# Patient Record
Sex: Male | Born: 1940
Health system: Southern US, Community
[De-identification: ages and names within clinical notes are randomized; demographics above are authoritative.]

## PROBLEM LIST (undated history)

## (undated) ENCOUNTER — Emergency Department (HOSPITAL_COMMUNITY): Admission: EM | Payer: PPO | Source: Home / Self Care

## (undated) DIAGNOSIS — K648 Other hemorrhoids: Secondary | ICD-10-CM

## (undated) DIAGNOSIS — I10 Essential (primary) hypertension: Secondary | ICD-10-CM

## (undated) DIAGNOSIS — E7800 Pure hypercholesterolemia, unspecified: Secondary | ICD-10-CM

## (undated) DIAGNOSIS — I4891 Unspecified atrial fibrillation: Secondary | ICD-10-CM

## (undated) DIAGNOSIS — F0390 Unspecified dementia without behavioral disturbance: Secondary | ICD-10-CM

## (undated) DIAGNOSIS — R339 Retention of urine, unspecified: Secondary | ICD-10-CM

## (undated) DIAGNOSIS — K5790 Diverticulosis of intestine, part unspecified, without perforation or abscess without bleeding: Secondary | ICD-10-CM

## (undated) DIAGNOSIS — K635 Polyp of colon: Secondary | ICD-10-CM

## (undated) HISTORY — DX: Diverticulosis of intestine, part unspecified, without perforation or abscess without bleeding: K57.90

## (undated) HISTORY — DX: Other hemorrhoids: K64.8

## (undated) HISTORY — DX: Polyp of colon: K63.5

## (undated) HISTORY — PX: SKIN CANCER EXCISION: SHX779

## (undated) HISTORY — DX: Essential (primary) hypertension: I10

## (undated) HISTORY — DX: Pure hypercholesterolemia, unspecified: E78.00

## (undated) HISTORY — PX: EYE SURGERY: SHX253

## (undated) HISTORY — PX: SQUAMOUS CELL CARCINOMA EXCISION: SHX2433

---
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
## 1979-11-14 HISTORY — PX: VASECTOMY: SHX75

## 1999-10-07 ENCOUNTER — Encounter: Admission: RE | Admit: 1999-10-07 | Discharge: 1999-10-07 | Payer: Self-pay | Admitting: *Deleted

## 2004-11-13 HISTORY — PX: HERNIA REPAIR: SHX51

## 2004-11-17 ENCOUNTER — Ambulatory Visit: Payer: Self-pay | Admitting: Internal Medicine

## 2005-02-02 ENCOUNTER — Encounter: Admission: RE | Admit: 2005-02-02 | Discharge: 2005-02-02 | Payer: Self-pay | Admitting: Surgery

## 2005-05-25 ENCOUNTER — Ambulatory Visit: Payer: Self-pay | Admitting: Internal Medicine

## 2005-05-29 ENCOUNTER — Ambulatory Visit: Payer: Self-pay

## 2005-07-06 ENCOUNTER — Ambulatory Visit: Payer: Self-pay | Admitting: Internal Medicine

## 2005-07-12 ENCOUNTER — Ambulatory Visit: Payer: Self-pay | Admitting: *Deleted

## 2005-09-04 ENCOUNTER — Ambulatory Visit: Payer: Self-pay | Admitting: Internal Medicine

## 2005-11-21 ENCOUNTER — Ambulatory Visit: Payer: Self-pay | Admitting: Internal Medicine

## 2006-08-21 ENCOUNTER — Ambulatory Visit: Payer: Self-pay | Admitting: Internal Medicine

## 2006-08-21 LAB — CONVERTED CEMR LAB
ALT: 22 units/L (ref 0–40)
AST: 22 units/L (ref 0–37)
BUN: 23 mg/dL (ref 6–23)
Chol/HDL Ratio, serum: 3
Cholesterol: 150 mg/dL (ref 0–200)
Creatinine, Ser: 1.2 mg/dL (ref 0.4–1.5)
HDL: 49.5 mg/dL (ref >39.0)
LDL Cholesterol: 97 mg/dL (ref 0–99)
PSA: 0.94 ng/mL (ref 0.10–4.00)
Potassium: 4.7 meq/L (ref 3.5–5.1)
Triglyceride fasting, serum: 19 mg/dL (ref 0–149)
VLDL: 4 mg/dL (ref 0–40)

## 2006-09-06 ENCOUNTER — Ambulatory Visit: Payer: Self-pay | Admitting: Internal Medicine

## 2006-09-26 ENCOUNTER — Ambulatory Visit: Payer: Self-pay | Admitting: Gastroenterology

## 2006-10-10 ENCOUNTER — Ambulatory Visit: Payer: Self-pay | Admitting: Gastroenterology

## 2007-10-03 ENCOUNTER — Ambulatory Visit: Payer: Self-pay | Admitting: Internal Medicine

## 2007-10-03 DIAGNOSIS — M2140 Flat foot [pes planus] (acquired), unspecified foot: Secondary | ICD-10-CM | POA: Insufficient documentation

## 2007-10-03 DIAGNOSIS — M159 Polyosteoarthritis, unspecified: Secondary | ICD-10-CM | POA: Insufficient documentation

## 2007-11-15 ENCOUNTER — Telehealth (INDEPENDENT_AMBULATORY_CARE_PROVIDER_SITE_OTHER): Payer: Self-pay | Admitting: *Deleted

## 2007-11-18 ENCOUNTER — Encounter: Payer: Self-pay | Admitting: Internal Medicine

## 2007-11-18 ENCOUNTER — Telehealth (INDEPENDENT_AMBULATORY_CARE_PROVIDER_SITE_OTHER): Payer: Self-pay | Admitting: *Deleted

## 2008-02-04 ENCOUNTER — Ambulatory Visit: Payer: Self-pay | Admitting: Internal Medicine

## 2008-02-09 LAB — CONVERTED CEMR LAB
ALT: 29 units/L (ref 0–53)
AST: 21 units/L (ref 0–37)
BUN: 20 mg/dL (ref 6–23)
Cholesterol: 125 mg/dL (ref 0–200)
Creatinine, Ser: 1.1 mg/dL (ref 0.4–1.5)
Glucose, Bld: 123 mg/dL — ABNORMAL HIGH (ref 70–99)
HDL: 42.4 mg/dL (ref >39.0)
LDL Cholesterol: 77 mg/dL (ref 0–99)
PSA: 0.95 ng/mL (ref 0.10–4.00)
Potassium: 4.6 meq/L (ref 3.5–5.1)
TSH: 0.78 microintl units/mL (ref 0.35–5.50)
Total CHOL/HDL Ratio: 2.9
Triglycerides: 28 mg/dL (ref 0–149)
VLDL: 6 mg/dL (ref 0–40)

## 2008-02-10 ENCOUNTER — Encounter (INDEPENDENT_AMBULATORY_CARE_PROVIDER_SITE_OTHER): Payer: Self-pay | Admitting: *Deleted

## 2008-02-11 ENCOUNTER — Ambulatory Visit: Payer: Self-pay | Admitting: Internal Medicine

## 2008-02-11 DIAGNOSIS — E785 Hyperlipidemia, unspecified: Secondary | ICD-10-CM | POA: Insufficient documentation

## 2008-02-11 DIAGNOSIS — N400 Enlarged prostate without lower urinary tract symptoms: Secondary | ICD-10-CM | POA: Insufficient documentation

## 2008-02-11 DIAGNOSIS — I10 Essential (primary) hypertension: Secondary | ICD-10-CM | POA: Insufficient documentation

## 2008-02-11 DIAGNOSIS — C439 Malignant melanoma of skin, unspecified: Secondary | ICD-10-CM | POA: Insufficient documentation

## 2008-02-11 DIAGNOSIS — Z85828 Personal history of other malignant neoplasm of skin: Secondary | ICD-10-CM | POA: Insufficient documentation

## 2008-02-13 ENCOUNTER — Encounter (INDEPENDENT_AMBULATORY_CARE_PROVIDER_SITE_OTHER): Payer: Self-pay | Admitting: *Deleted

## 2008-03-30 ENCOUNTER — Telehealth (INDEPENDENT_AMBULATORY_CARE_PROVIDER_SITE_OTHER): Payer: Self-pay | Admitting: *Deleted

## 2008-03-31 ENCOUNTER — Ambulatory Visit: Payer: Self-pay | Admitting: Internal Medicine

## 2008-03-31 DIAGNOSIS — R229 Localized swelling, mass and lump, unspecified: Secondary | ICD-10-CM | POA: Insufficient documentation

## 2008-04-13 ENCOUNTER — Other Ambulatory Visit: Admission: RE | Admit: 2008-04-13 | Discharge: 2008-04-13 | Payer: Self-pay | Admitting: Otolaryngology

## 2008-04-13 ENCOUNTER — Encounter: Payer: Self-pay | Admitting: Internal Medicine

## 2008-04-17 ENCOUNTER — Telehealth: Payer: Self-pay | Admitting: Internal Medicine

## 2008-04-22 ENCOUNTER — Encounter: Payer: Self-pay | Admitting: Internal Medicine

## 2008-04-23 ENCOUNTER — Telehealth (INDEPENDENT_AMBULATORY_CARE_PROVIDER_SITE_OTHER): Payer: Self-pay | Admitting: *Deleted

## 2008-04-24 ENCOUNTER — Encounter: Payer: Self-pay | Admitting: Internal Medicine

## 2008-04-24 ENCOUNTER — Encounter: Admission: RE | Admit: 2008-04-24 | Discharge: 2008-04-24 | Payer: Self-pay | Admitting: Otolaryngology

## 2008-04-27 ENCOUNTER — Encounter: Payer: Self-pay | Admitting: Internal Medicine

## 2008-04-27 ENCOUNTER — Ambulatory Visit: Admission: RE | Admit: 2008-04-27 | Discharge: 2008-05-28 | Payer: Self-pay | Admitting: Radiation Oncology

## 2008-04-27 ENCOUNTER — Ambulatory Visit (HOSPITAL_COMMUNITY): Admission: RE | Admit: 2008-04-27 | Discharge: 2008-04-27 | Payer: Self-pay | Admitting: Otolaryngology

## 2008-04-28 ENCOUNTER — Encounter: Payer: Self-pay | Admitting: Internal Medicine

## 2008-05-04 ENCOUNTER — Encounter: Payer: Self-pay | Admitting: Internal Medicine

## 2008-06-05 ENCOUNTER — Encounter: Payer: Self-pay | Admitting: Internal Medicine

## 2008-06-10 ENCOUNTER — Encounter: Payer: Self-pay | Admitting: Internal Medicine

## 2008-06-10 ENCOUNTER — Telehealth (INDEPENDENT_AMBULATORY_CARE_PROVIDER_SITE_OTHER): Payer: Self-pay | Admitting: *Deleted

## 2008-06-16 ENCOUNTER — Ambulatory Visit: Admission: RE | Admit: 2008-06-16 | Discharge: 2008-09-08 | Payer: Self-pay | Admitting: Radiation Oncology

## 2008-06-17 ENCOUNTER — Encounter: Payer: Self-pay | Admitting: Internal Medicine

## 2008-06-23 ENCOUNTER — Telehealth: Payer: Self-pay | Admitting: Internal Medicine

## 2008-06-24 ENCOUNTER — Ambulatory Visit: Payer: Self-pay | Admitting: Dentistry

## 2008-06-24 ENCOUNTER — Telehealth (INDEPENDENT_AMBULATORY_CARE_PROVIDER_SITE_OTHER): Payer: Self-pay | Admitting: *Deleted

## 2008-06-24 ENCOUNTER — Encounter: Admission: RE | Admit: 2008-06-24 | Discharge: 2008-06-24 | Payer: Self-pay | Admitting: Dentistry

## 2008-06-29 ENCOUNTER — Ambulatory Visit: Payer: Self-pay | Admitting: Dentistry

## 2008-07-01 ENCOUNTER — Encounter: Payer: Self-pay | Admitting: Internal Medicine

## 2008-08-25 ENCOUNTER — Encounter: Payer: Self-pay | Admitting: Internal Medicine

## 2008-08-25 ENCOUNTER — Telehealth: Payer: Self-pay | Admitting: Internal Medicine

## 2008-08-31 ENCOUNTER — Encounter: Payer: Self-pay | Admitting: Internal Medicine

## 2008-09-14 ENCOUNTER — Encounter: Payer: Self-pay | Admitting: Internal Medicine

## 2008-09-17 ENCOUNTER — Encounter: Payer: Self-pay | Admitting: Internal Medicine

## 2008-09-21 ENCOUNTER — Encounter: Payer: Self-pay | Admitting: Internal Medicine

## 2008-09-30 ENCOUNTER — Encounter: Payer: Self-pay | Admitting: Internal Medicine

## 2008-10-05 ENCOUNTER — Ambulatory Visit: Payer: Self-pay | Admitting: Dentistry

## 2008-10-19 ENCOUNTER — Encounter: Payer: Self-pay | Admitting: Internal Medicine

## 2008-10-21 ENCOUNTER — Encounter: Payer: Self-pay | Admitting: Internal Medicine

## 2008-11-02 ENCOUNTER — Encounter: Payer: Self-pay | Admitting: Internal Medicine

## 2008-11-10 ENCOUNTER — Telehealth (INDEPENDENT_AMBULATORY_CARE_PROVIDER_SITE_OTHER): Payer: Self-pay | Admitting: *Deleted

## 2008-11-10 ENCOUNTER — Ambulatory Visit: Payer: Self-pay | Admitting: Internal Medicine

## 2008-11-10 DIAGNOSIS — R7309 Other abnormal glucose: Secondary | ICD-10-CM | POA: Insufficient documentation

## 2008-11-10 LAB — CONVERTED CEMR LAB
BUN: 17 mg/dL (ref 6–23)
Creatinine, Ser: 0.9 mg/dL (ref 0.4–1.5)
Potassium: 5 meq/L (ref 3.5–5.1)

## 2008-11-11 ENCOUNTER — Encounter (INDEPENDENT_AMBULATORY_CARE_PROVIDER_SITE_OTHER): Payer: Self-pay | Admitting: *Deleted

## 2009-01-05 ENCOUNTER — Encounter: Payer: Self-pay | Admitting: Internal Medicine

## 2009-02-09 ENCOUNTER — Telehealth (INDEPENDENT_AMBULATORY_CARE_PROVIDER_SITE_OTHER): Payer: Self-pay | Admitting: *Deleted

## 2009-02-09 ENCOUNTER — Encounter: Payer: Self-pay | Admitting: Internal Medicine

## 2009-02-16 ENCOUNTER — Encounter: Payer: Self-pay | Admitting: Internal Medicine

## 2009-03-26 ENCOUNTER — Telehealth (INDEPENDENT_AMBULATORY_CARE_PROVIDER_SITE_OTHER): Payer: Self-pay | Admitting: *Deleted

## 2010-12-11 LAB — CONVERTED CEMR LAB
Hgb A1c MFr Bld: 5.2 % (ref 4.6–6.0)
Microalb Creat Ratio: 27 mg/g (ref 0.0–30.0)

## 2013-12-26 ENCOUNTER — Other Ambulatory Visit (HOSPITAL_COMMUNITY): Payer: Self-pay | Admitting: Internal Medicine

## 2013-12-26 ENCOUNTER — Ambulatory Visit (HOSPITAL_COMMUNITY)
Admission: RE | Admit: 2013-12-26 | Discharge: 2013-12-26 | Disposition: A | Discharge status: Home / Self Care | Payer: Medicare HMO | Source: Ambulatory Visit | Attending: Vascular Surgery | Admitting: Vascular Surgery

## 2013-12-26 DIAGNOSIS — I6529 Occlusion and stenosis of unspecified carotid artery: Secondary | ICD-10-CM

## 2013-12-26 DIAGNOSIS — Z923 Personal history of irradiation: Secondary | ICD-10-CM | POA: Insufficient documentation

## 2014-10-26 DIAGNOSIS — E875 Hyperkalemia: Secondary | ICD-10-CM | POA: Diagnosis present

## 2015-09-01 DIAGNOSIS — R69 Illness, unspecified: Secondary | ICD-10-CM | POA: Diagnosis not present

## 2015-09-01 DIAGNOSIS — C44222 Squamous cell carcinoma of skin of right ear and external auricular canal: Secondary | ICD-10-CM | POA: Diagnosis not present

## 2015-09-01 DIAGNOSIS — K0381 Cracked tooth: Secondary | ICD-10-CM | POA: Diagnosis not present

## 2015-09-22 DIAGNOSIS — R69 Illness, unspecified: Secondary | ICD-10-CM | POA: Diagnosis not present

## 2015-09-22 DIAGNOSIS — K0381 Cracked tooth: Secondary | ICD-10-CM | POA: Diagnosis not present

## 2015-09-22 DIAGNOSIS — C44222 Squamous cell carcinoma of skin of right ear and external auricular canal: Secondary | ICD-10-CM | POA: Diagnosis not present

## 2015-10-06 DIAGNOSIS — Z23 Encounter for immunization: Secondary | ICD-10-CM | POA: Diagnosis not present

## 2015-10-11 DIAGNOSIS — Z85820 Personal history of malignant melanoma of skin: Secondary | ICD-10-CM | POA: Diagnosis not present

## 2015-10-11 DIAGNOSIS — D485 Neoplasm of uncertain behavior of skin: Secondary | ICD-10-CM | POA: Diagnosis not present

## 2015-10-11 DIAGNOSIS — D0439 Carcinoma in situ of skin of other parts of face: Secondary | ICD-10-CM | POA: Diagnosis not present

## 2015-10-11 DIAGNOSIS — L570 Actinic keratosis: Secondary | ICD-10-CM | POA: Diagnosis not present

## 2015-10-11 DIAGNOSIS — Z85828 Personal history of other malignant neoplasm of skin: Secondary | ICD-10-CM | POA: Diagnosis not present

## 2015-10-13 DIAGNOSIS — C44222 Squamous cell carcinoma of skin of right ear and external auricular canal: Secondary | ICD-10-CM | POA: Diagnosis not present

## 2015-10-13 DIAGNOSIS — K0381 Cracked tooth: Secondary | ICD-10-CM | POA: Diagnosis not present

## 2015-11-26 DIAGNOSIS — Z0000 Encounter for general adult medical examination without abnormal findings: Secondary | ICD-10-CM | POA: Diagnosis not present

## 2015-11-26 DIAGNOSIS — I2510 Atherosclerotic heart disease of native coronary artery without angina pectoris: Secondary | ICD-10-CM | POA: Diagnosis not present

## 2015-11-26 DIAGNOSIS — E785 Hyperlipidemia, unspecified: Secondary | ICD-10-CM | POA: Diagnosis not present

## 2015-11-26 DIAGNOSIS — I10 Essential (primary) hypertension: Secondary | ICD-10-CM | POA: Diagnosis not present

## 2015-11-26 DIAGNOSIS — R7301 Impaired fasting glucose: Secondary | ICD-10-CM | POA: Diagnosis not present

## 2015-11-26 DIAGNOSIS — Z125 Encounter for screening for malignant neoplasm of prostate: Secondary | ICD-10-CM | POA: Diagnosis not present

## 2015-11-26 DIAGNOSIS — E784 Other hyperlipidemia: Secondary | ICD-10-CM | POA: Diagnosis not present

## 2015-11-26 DIAGNOSIS — N183 Chronic kidney disease, stage 3 (moderate): Secondary | ICD-10-CM | POA: Diagnosis not present

## 2015-12-02 DIAGNOSIS — R413 Other amnesia: Secondary | ICD-10-CM | POA: Diagnosis not present

## 2015-12-02 DIAGNOSIS — I739 Peripheral vascular disease, unspecified: Secondary | ICD-10-CM | POA: Diagnosis not present

## 2015-12-02 DIAGNOSIS — I2510 Atherosclerotic heart disease of native coronary artery without angina pectoris: Secondary | ICD-10-CM | POA: Diagnosis not present

## 2015-12-02 DIAGNOSIS — R808 Other proteinuria: Secondary | ICD-10-CM | POA: Diagnosis not present

## 2015-12-02 DIAGNOSIS — Z1212 Encounter for screening for malignant neoplasm of rectum: Secondary | ICD-10-CM | POA: Diagnosis not present

## 2015-12-02 DIAGNOSIS — E784 Other hyperlipidemia: Secondary | ICD-10-CM | POA: Diagnosis not present

## 2015-12-02 DIAGNOSIS — H9190 Unspecified hearing loss, unspecified ear: Secondary | ICD-10-CM | POA: Diagnosis not present

## 2015-12-02 DIAGNOSIS — N529 Male erectile dysfunction, unspecified: Secondary | ICD-10-CM | POA: Diagnosis not present

## 2015-12-02 DIAGNOSIS — E875 Hyperkalemia: Secondary | ICD-10-CM | POA: Diagnosis not present

## 2015-12-02 DIAGNOSIS — N183 Chronic kidney disease, stage 3 (moderate): Secondary | ICD-10-CM | POA: Diagnosis not present

## 2015-12-02 DIAGNOSIS — Z0000 Encounter for general adult medical examination without abnormal findings: Secondary | ICD-10-CM | POA: Diagnosis not present

## 2015-12-13 DIAGNOSIS — R69 Illness, unspecified: Secondary | ICD-10-CM | POA: Diagnosis not present

## 2016-01-10 DIAGNOSIS — D0439 Carcinoma in situ of skin of other parts of face: Secondary | ICD-10-CM | POA: Diagnosis not present

## 2016-01-10 DIAGNOSIS — Z85820 Personal history of malignant melanoma of skin: Secondary | ICD-10-CM | POA: Diagnosis not present

## 2016-01-10 DIAGNOSIS — L570 Actinic keratosis: Secondary | ICD-10-CM | POA: Diagnosis not present

## 2016-01-10 DIAGNOSIS — Z87891 Personal history of nicotine dependence: Secondary | ICD-10-CM | POA: Diagnosis not present

## 2016-01-10 DIAGNOSIS — L821 Other seborrheic keratosis: Secondary | ICD-10-CM | POA: Diagnosis not present

## 2016-01-10 DIAGNOSIS — B351 Tinea unguium: Secondary | ICD-10-CM | POA: Diagnosis not present

## 2016-01-10 DIAGNOSIS — L219 Seborrheic dermatitis, unspecified: Secondary | ICD-10-CM | POA: Diagnosis not present

## 2016-01-10 DIAGNOSIS — L814 Other melanin hyperpigmentation: Secondary | ICD-10-CM | POA: Diagnosis not present

## 2016-01-10 DIAGNOSIS — Z85828 Personal history of other malignant neoplasm of skin: Secondary | ICD-10-CM | POA: Diagnosis not present

## 2016-01-12 DIAGNOSIS — Z23 Encounter for immunization: Secondary | ICD-10-CM | POA: Diagnosis not present

## 2016-02-21 DIAGNOSIS — Z85828 Personal history of other malignant neoplasm of skin: Secondary | ICD-10-CM | POA: Diagnosis not present

## 2016-02-21 DIAGNOSIS — L989 Disorder of the skin and subcutaneous tissue, unspecified: Secondary | ICD-10-CM | POA: Diagnosis not present

## 2016-02-21 DIAGNOSIS — Z9889 Other specified postprocedural states: Secondary | ICD-10-CM | POA: Diagnosis not present

## 2016-02-21 DIAGNOSIS — R69 Illness, unspecified: Secondary | ICD-10-CM | POA: Diagnosis not present

## 2016-02-21 DIAGNOSIS — L910 Hypertrophic scar: Secondary | ICD-10-CM | POA: Diagnosis not present

## 2016-02-21 DIAGNOSIS — Z923 Personal history of irradiation: Secondary | ICD-10-CM | POA: Diagnosis not present

## 2016-02-21 DIAGNOSIS — H905 Unspecified sensorineural hearing loss: Secondary | ICD-10-CM | POA: Diagnosis not present

## 2016-02-21 DIAGNOSIS — Z483 Aftercare following surgery for neoplasm: Secondary | ICD-10-CM | POA: Diagnosis not present

## 2016-02-21 DIAGNOSIS — C44329 Squamous cell carcinoma of skin of other parts of face: Secondary | ICD-10-CM | POA: Diagnosis not present

## 2016-07-04 DIAGNOSIS — Z85820 Personal history of malignant melanoma of skin: Secondary | ICD-10-CM | POA: Diagnosis not present

## 2016-07-04 DIAGNOSIS — L814 Other melanin hyperpigmentation: Secondary | ICD-10-CM | POA: Diagnosis not present

## 2016-07-04 DIAGNOSIS — L710 Perioral dermatitis: Secondary | ICD-10-CM | POA: Diagnosis not present

## 2016-07-04 DIAGNOSIS — B351 Tinea unguium: Secondary | ICD-10-CM | POA: Diagnosis not present

## 2016-07-04 DIAGNOSIS — L219 Seborrheic dermatitis, unspecified: Secondary | ICD-10-CM | POA: Diagnosis not present

## 2016-07-04 DIAGNOSIS — D485 Neoplasm of uncertain behavior of skin: Secondary | ICD-10-CM | POA: Diagnosis not present

## 2016-07-04 DIAGNOSIS — L851 Acquired keratosis [keratoderma] palmaris et plantaris: Secondary | ICD-10-CM | POA: Diagnosis not present

## 2016-07-04 DIAGNOSIS — L821 Other seborrheic keratosis: Secondary | ICD-10-CM | POA: Diagnosis not present

## 2016-07-04 DIAGNOSIS — L731 Pseudofolliculitis barbae: Secondary | ICD-10-CM | POA: Diagnosis not present

## 2016-07-04 DIAGNOSIS — L570 Actinic keratosis: Secondary | ICD-10-CM | POA: Diagnosis not present

## 2016-07-04 DIAGNOSIS — Z85828 Personal history of other malignant neoplasm of skin: Secondary | ICD-10-CM | POA: Diagnosis not present

## 2016-07-24 ENCOUNTER — Encounter: Payer: Self-pay | Admitting: Gastroenterology

## 2016-08-07 ENCOUNTER — Telehealth: Payer: Self-pay | Admitting: Internal Medicine

## 2016-08-07 DIAGNOSIS — I10 Essential (primary) hypertension: Secondary | ICD-10-CM | POA: Diagnosis not present

## 2016-08-07 DIAGNOSIS — J069 Acute upper respiratory infection, unspecified: Secondary | ICD-10-CM | POA: Diagnosis not present

## 2016-08-07 DIAGNOSIS — R05 Cough: Secondary | ICD-10-CM | POA: Diagnosis not present

## 2016-08-07 DIAGNOSIS — Z6825 Body mass index (BMI) 25.0-25.9, adult: Secondary | ICD-10-CM | POA: Diagnosis not present

## 2016-08-07 NOTE — Telephone Encounter (Signed)
Yes

## 2016-08-11 ENCOUNTER — Encounter: Payer: Self-pay | Admitting: Internal Medicine

## 2016-08-21 DIAGNOSIS — H9041 Sensorineural hearing loss, unilateral, right ear, with unrestricted hearing on the contralateral side: Secondary | ICD-10-CM | POA: Diagnosis not present

## 2016-08-21 DIAGNOSIS — C44222 Squamous cell carcinoma of skin of right ear and external auricular canal: Secondary | ICD-10-CM | POA: Diagnosis not present

## 2016-08-21 DIAGNOSIS — C760 Malignant neoplasm of head, face and neck: Secondary | ICD-10-CM | POA: Diagnosis not present

## 2016-08-21 DIAGNOSIS — Z974 Presence of external hearing-aid: Secondary | ICD-10-CM | POA: Diagnosis not present

## 2016-09-11 DIAGNOSIS — H04551 Acquired stenosis of right nasolacrimal duct: Secondary | ICD-10-CM | POA: Diagnosis not present

## 2016-09-11 DIAGNOSIS — H0236 Blepharochalasis left eye, unspecified eyelid: Secondary | ICD-10-CM | POA: Diagnosis not present

## 2016-09-11 DIAGNOSIS — H0259 Other disorders affecting eyelid function: Secondary | ICD-10-CM | POA: Diagnosis not present

## 2016-09-11 DIAGNOSIS — S0451XS Injury of facial nerve, right side, sequela: Secondary | ICD-10-CM | POA: Diagnosis not present

## 2016-09-11 DIAGNOSIS — I10 Essential (primary) hypertension: Secondary | ICD-10-CM | POA: Diagnosis not present

## 2016-09-11 DIAGNOSIS — H02103 Unspecified ectropion of right eye, unspecified eyelid: Secondary | ICD-10-CM | POA: Diagnosis not present

## 2016-09-11 DIAGNOSIS — X58XXXS Exposure to other specified factors, sequela: Secondary | ICD-10-CM | POA: Diagnosis not present

## 2016-09-11 DIAGNOSIS — H0233 Blepharochalasis right eye, unspecified eyelid: Secondary | ICD-10-CM | POA: Diagnosis not present

## 2016-09-11 DIAGNOSIS — Z9841 Cataract extraction status, right eye: Secondary | ICD-10-CM | POA: Diagnosis not present

## 2016-09-11 DIAGNOSIS — Z9842 Cataract extraction status, left eye: Secondary | ICD-10-CM | POA: Diagnosis not present

## 2016-09-11 DIAGNOSIS — H04221 Epiphora due to insufficient drainage, right lacrimal gland: Secondary | ICD-10-CM | POA: Diagnosis not present

## 2016-09-21 DIAGNOSIS — R05 Cough: Secondary | ICD-10-CM | POA: Diagnosis not present

## 2016-09-21 DIAGNOSIS — Z6825 Body mass index (BMI) 25.0-25.9, adult: Secondary | ICD-10-CM | POA: Diagnosis not present

## 2016-09-21 DIAGNOSIS — J069 Acute upper respiratory infection, unspecified: Secondary | ICD-10-CM | POA: Diagnosis not present

## 2016-09-27 ENCOUNTER — Encounter: Payer: Self-pay | Admitting: *Deleted

## 2016-09-27 DIAGNOSIS — J3489 Other specified disorders of nose and nasal sinuses: Secondary | ICD-10-CM | POA: Diagnosis not present

## 2016-09-27 DIAGNOSIS — J069 Acute upper respiratory infection, unspecified: Secondary | ICD-10-CM | POA: Diagnosis not present

## 2016-09-27 DIAGNOSIS — Z79899 Other long term (current) drug therapy: Secondary | ICD-10-CM | POA: Diagnosis not present

## 2016-09-27 DIAGNOSIS — I10 Essential (primary) hypertension: Secondary | ICD-10-CM | POA: Diagnosis not present

## 2016-09-27 DIAGNOSIS — J342 Deviated nasal septum: Secondary | ICD-10-CM | POA: Diagnosis not present

## 2016-09-27 DIAGNOSIS — Z87891 Personal history of nicotine dependence: Secondary | ICD-10-CM | POA: Diagnosis not present

## 2016-09-27 DIAGNOSIS — Z7982 Long term (current) use of aspirin: Secondary | ICD-10-CM | POA: Diagnosis not present

## 2016-09-27 DIAGNOSIS — H04201 Unspecified epiphora, right lacrimal gland: Secondary | ICD-10-CM | POA: Diagnosis not present

## 2016-09-27 DIAGNOSIS — J320 Chronic maxillary sinusitis: Secondary | ICD-10-CM | POA: Diagnosis not present

## 2016-10-07 DIAGNOSIS — R69 Illness, unspecified: Secondary | ICD-10-CM | POA: Diagnosis not present

## 2016-10-18 DIAGNOSIS — Z6825 Body mass index (BMI) 25.0-25.9, adult: Secondary | ICD-10-CM | POA: Diagnosis not present

## 2016-10-18 DIAGNOSIS — J40 Bronchitis, not specified as acute or chronic: Secondary | ICD-10-CM | POA: Diagnosis not present

## 2016-11-03 ENCOUNTER — Encounter: Payer: Self-pay | Admitting: Internal Medicine

## 2016-11-03 ENCOUNTER — Ambulatory Visit (INDEPENDENT_AMBULATORY_CARE_PROVIDER_SITE_OTHER): Payer: Medicare HMO | Admitting: Internal Medicine

## 2016-11-03 ENCOUNTER — Encounter (INDEPENDENT_AMBULATORY_CARE_PROVIDER_SITE_OTHER): Payer: Self-pay

## 2016-11-03 VITALS — BP 106/70 | HR 60 | Ht 71.26 in | Wt 181.1 lb

## 2016-11-03 DIAGNOSIS — Z1211 Encounter for screening for malignant neoplasm of colon: Secondary | ICD-10-CM

## 2016-11-03 MED ORDER — NA SULFATE-K SULFATE-MG SULF 17.5-3.13-1.6 GM/177ML PO SOLN: ORAL | 0 refills | Status: DC

## 2016-11-03 NOTE — Patient Instructions (Signed)
You have been scheduled for a colonoscopy. Please follow written instructions given to you at your visit today.  Please pick up your prep supplies at the pharmacy within the next 1-3 days. If you use inhalers (even only as needed), please bring them with you on the day of your procedure. Your physician has requested that you go to www.startemmi.com and enter the access code given to you at your visit today. This web site gives a general overview about your procedure. However, you should still follow specific instructions given to you by our office regarding your preparation for the procedure.  If you are age 74 or older, your body mass index should be between 23-30. Your Body mass index is 25.08 kg/m. If this is out of the aforementioned range listed, please consider follow up with your Primary Care Provider.  If you are age 24 or younger, your body mass index should be between 19-25. Your Body mass index is 25.08 kg/m. If this is out of the aformentioned range listed, please consider follow up with your Primary Care Provider.

## 2016-11-03 NOTE — Progress Notes (Signed)
Patient ID: Jeffrey [name], male   DOB: 01/17/1941, 75 y.o.   MRN: [id number] HPI: Jeffrey [name] is a 75 year old male with a history of diverticulosis, hyperplastic colon polyp, hypertension and hypercholesterolemia who is seen to discuss colorectal cancer screening. He is here today with his wife. He reports that he is feeling well and he denies current GI complaint. Reports good appetite. No nausea, vomiting, heartburn or trouble swallowing. No abdominal pain. No change in bowel habit. No diarrhea or constipation. No blood in his stool or melena.  His last colonoscopy was performed on 10/10/2006 by Dr. Verl Blalock. This revealed colonic diverticulosis and internal hemorrhoids. In the indication it is noted that he had hyperplastic colon polyps in 2002 that this report is not readily available for my review  He denies a family history of colon cancer  Past Medical History:  Diagnosis Date  . Colon polyps   . Diverticulosis   . Hypercholesterolemia   . Hypertension   . Internal hemorrhoids     Past Surgical History:  Procedure Laterality Date  . EYE SURGERY     X 4  . HERNIA REPAIR  2006   abdominal  . SQUAMOUS CELL CARCINOMA EXCISION     preauricular on the right  . TONSILLECTOMY  1947  . VASECTOMY  1981    No outpatient prescriptions prior to visit.   No facility-administered medications prior to visit.     Allergies  Allergen Reactions  . Other Other (See Comments)    Vicryl sutures, pte. Doesn't know what reaction he had, "Drs. Owens Shark + Yeatts deetrmined not to use it on me"    Family History  Problem Relation Age of Onset  . Heart disease Father   . Colon cancer Neg Hx   . Stomach cancer Neg Hx   . Esophageal cancer Neg Hx   . Rectal cancer Neg Hx   . Liver cancer Neg Hx     Social History  Substance Use Topics  . Smoking status: Former Research scientist (life sciences)  . Smokeless tobacco: Never Used  . Alcohol use Yes     Comment: limited    ROS: As per history of present  illness, otherwise negative  BP 106/70   Pulse 60   Ht 5' 11.26" (1.81 m)   Wt 181 lb 2 oz (82.2 kg)   BMI 25.08 kg/m  Constitutional: Well-developed and well-nourished. No distress. HEENT:  Oropharynx is clear and moist. No oropharyngeal exudate. Conjunctivae are normal.  No scleral icterus. Neck: Neck supple. Trachea midline. Cardiovascular: Normal rate, regular rhythm and intact distal pulses.  Pulmonary/chest: Effort normal and breath sounds normal. No wheezing, rales or rhonchi. Abdominal: Soft, nontender, nondistended. Bowel sounds active throughout.  Extremities: no clubbing, cyanosis, or edema Neurological: Alert and oriented to person place and time. Skin: Skin is warm and dry.  Psychiatric: Normal mood and affect. Behavior is normal.   ASSESSMENT/PLAN: 75 year old male with a history of diverticulosis, hyperplastic colon polyp, hypertension and hypercholesterolemia who is seen to discuss colorectal cancer screening.  1. Colon cancer screening -- average risk, due for screening colonoscopy at this time. We discussed the risk of benefit and alternatives and he wishes to proceed. We briefly discussed Cologuard but after this discussion he and his wife prefer colonoscopy. The test will be scheduled for him.   [id number], [place], [place] [address]

## 2016-11-16 ENCOUNTER — Telehealth: Payer: Self-pay | Admitting: Internal Medicine

## 2016-11-16 NOTE — Telephone Encounter (Signed)
Advised patient that I can make a 50 dollar voucher available to him. He requests it be mailed to his home. Voucher mailed.

## 2016-11-28 DIAGNOSIS — I779 Disorder of arteries and arterioles, unspecified: Secondary | ICD-10-CM | POA: Insufficient documentation

## 2016-12-06 ENCOUNTER — Ambulatory Visit (AMBULATORY_SURGERY_CENTER): Payer: Medicare HMO | Admitting: Internal Medicine

## 2016-12-06 ENCOUNTER — Encounter: Payer: Self-pay | Admitting: Internal Medicine

## 2016-12-06 VITALS — BP 140/90 | HR 69 | Temp 98.0°F | Resp 13 | Ht 71.0 in | Wt 181.0 lb

## 2016-12-06 DIAGNOSIS — Z1211 Encounter for screening for malignant neoplasm of colon: Secondary | ICD-10-CM | POA: Diagnosis present

## 2016-12-06 DIAGNOSIS — I10 Essential (primary) hypertension: Secondary | ICD-10-CM | POA: Diagnosis not present

## 2016-12-06 DIAGNOSIS — Z1212 Encounter for screening for malignant neoplasm of rectum: Secondary | ICD-10-CM | POA: Diagnosis not present

## 2016-12-06 MED ORDER — SODIUM CHLORIDE 0.9 % IV SOLN: 500.0000 mL | INTRAVENOUS | Status: DC

## 2016-12-06 NOTE — Progress Notes (Signed)
Report to PACU, RN, vss, BBS= Clear.  

## 2016-12-06 NOTE — Patient Instructions (Signed)
Handouts given: Diverticulosis,Hemorrhoids.   YOU HAD AN ENDOSCOPIC PROCEDURE TODAY AT THE [facility]:   Refer to the procedure report that was given to you for any specific questions about what was found during the examination.  If the procedure report does not answer your questions, please call your gastroenterologist to clarify.  If you requested that your care partner not be given the details of your procedure findings, then the procedure report has been included in a sealed envelope for you to review at your convenience later.  YOU SHOULD EXPECT: Some feelings of bloating in the abdomen. Passage of more gas than usual.  Walking can help get rid of the air that was put into your GI tract during the procedure and reduce the bloating. If you had a lower endoscopy (such as a colonoscopy or flexible sigmoidoscopy) you may notice spotting of blood in your stool or on the toilet paper. If you underwent a bowel prep for your procedure, you may not have a normal bowel movement for a few days.  Please Note:  You might notice some irritation and congestion in your nose or some drainage.  This is from the oxygen used during your procedure.  There is no need for concern and it should clear up in a day or so.  SYMPTOMS TO REPORT IMMEDIATELY:   Following lower endoscopy (colonoscopy or flexible sigmoidoscopy):  Excessive amounts of blood in the stool  Significant tenderness or worsening of abdominal pains  Swelling of the abdomen that is new, acute  Fever of 100F or higher   For urgent or emergent issues, a gastroenterologist can be reached at any hour by calling [phone].   DIET:  We do recommend a small meal at first, but then you may proceed to your regular diet.  Drink plenty of fluids but you should avoid alcoholic beverages for 24 hours.  ACTIVITY:  You should plan to take it easy for the rest of today and you should NOT DRIVE or use heavy machinery until tomorrow (because of  the sedation medicines used during the test).    FOLLOW UP: Our staff will call the number listed on your records the next business day following your procedure to check on you and address any questions or concerns that you may have regarding the information given to you following your procedure. If we do not reach you, we will leave a message.  However, if you are feeling well and you are not experiencing any problems, there is no need to return our call.  We will assume that you have returned to your regular daily activities without incident.  If any biopsies were taken you will be contacted by phone or by letter within the next 1-3 weeks.  Please call us at [phone] if you have not heard about the biopsies in 3 weeks.    SIGNATURES/CONFIDENTIALITY: You and/or your care partner have signed paperwork which will be entered into your electronic medical record.  These signatures attest to the fact that that the information above on your After Visit Summary has been reviewed and is understood.  Full responsibility of the confidentiality of this discharge information lies with you and/or your care-partner. 

## 2016-12-06 NOTE — Op Note (Signed)
[facility] Patient Name: Jeffrey [name] Procedure Date: 12/06/2016 9:34 AM MRN: [id number] Endoscopist: Jerene Bears , MD Age: 76 Referring MD:  Date of Birth: 07-08-1941 Gender: Male Account #: [id number] Procedure:                Colonoscopy Indications:              Screening for colorectal malignant neoplasm, Last                            colonoscopy 10 years ago Medicines:                Monitored Anesthesia Care Procedure:                Pre-Anesthesia Assessment:                           - Prior to the procedure, a History and Physical                            was performed, and patient medications and                            allergies were reviewed. The patient's tolerance of                            previous anesthesia was also reviewed. The risks                            and benefits of the procedure and the sedation                            options and risks were discussed with the patient.                            All questions were answered, and informed consent                            was obtained. Prior Anticoagulants: The patient has                            taken no previous anticoagulant or antiplatelet                            agents. ASA Grade Assessment: III - A patient with                            severe systemic disease. After reviewing the risks                            and benefits, the patient was deemed in                            satisfactory condition to undergo the procedure.  After obtaining informed consent, the colonoscope                            was passed under direct vision. Throughout the                            procedure, the patient's blood pressure, pulse, and                            oxygen saturations were monitored continuously. The                            Model CF-HQ190L [id number]) scope was introduced                            through the anus and advanced to  the the cecum,                            identified by appendiceal orifice and ileocecal                            valve. The colonoscopy was performed without                            difficulty. The patient tolerated the procedure                            well. The quality of the bowel preparation was                            good. The ileocecal valve, appendiceal orifice, and                            rectum were photographed. The bowel preparation                            used was SUPREP. Scope In: 9:43:52 AM Scope Out: 9:59:26 AM Scope Withdrawal Time: 0 hours 10 minutes 45 seconds  Total Procedure Duration: 0 hours 15 minutes 34 seconds  Findings:                 The perianal examination was normal.                           Multiple small and large-mouthed diverticula were                            found from ascending colon to sigmoid colon.                           Internal hemorrhoids were found during                            retroflexion. The hemorrhoids were small.  The exam was otherwise without abnormality. Complications:            No immediate complications. Estimated Blood Loss:     Estimated blood loss was minimal. Impression:               - Moderate diverticulosis from ascending colon to                            sigmoid colon.                           - Internal hemorrhoids.                           - The examination was otherwise normal.                           - No specimens collected. Recommendation:           - Patient has a contact number available for                            emergencies. The signs and symptoms of potential                            delayed complications were discussed with the                            patient. Return to normal activities tomorrow.                            Written discharge instructions were provided to the                            patient.                           -  Resume previous diet.                           - Continue present medications.                           - No recommendation for repeat colonoscopy due to                            age. Screening colonoscopy generally stops around                            age 20 and your recommended interval is 10 years                            given findings from today's examination. Jerene Bears, MD 12/06/2016 10:04:01 AM This report has been signed electronically.

## 2016-12-07 ENCOUNTER — Telehealth: Payer: Self-pay

## 2016-12-07 NOTE — Telephone Encounter (Signed)
  Follow up Call-  Call back number 12/06/2016  Post procedure Call Back phone  # [phone]  Permission to leave phone message Yes  Some recent data might be hidden     Patient questions:  Do you have a fever, pain , or abdominal swelling? No. Pain Score  0 *  Have you tolerated food without any problems? Yes.    Have you been able to return to your normal activities? Yes.    Do you have any questions about your discharge instructions: Diet   No. Medications  No. Follow up visit  No.  Do you have questions or concerns about your Care? No.  Actions: * If pain score is 4 or above: No action needed, pain <4.

## 2016-12-11 DIAGNOSIS — E784 Other hyperlipidemia: Secondary | ICD-10-CM | POA: Diagnosis not present

## 2016-12-11 DIAGNOSIS — Z125 Encounter for screening for malignant neoplasm of prostate: Secondary | ICD-10-CM | POA: Diagnosis not present

## 2016-12-11 DIAGNOSIS — R7301 Impaired fasting glucose: Secondary | ICD-10-CM | POA: Diagnosis not present

## 2016-12-11 DIAGNOSIS — I10 Essential (primary) hypertension: Secondary | ICD-10-CM | POA: Diagnosis not present

## 2016-12-20 DIAGNOSIS — H9193 Unspecified hearing loss, bilateral: Secondary | ICD-10-CM | POA: Diagnosis not present

## 2016-12-20 DIAGNOSIS — Z125 Encounter for screening for malignant neoplasm of prostate: Secondary | ICD-10-CM | POA: Diagnosis not present

## 2016-12-20 DIAGNOSIS — N183 Chronic kidney disease, stage 3 (moderate): Secondary | ICD-10-CM | POA: Diagnosis not present

## 2016-12-20 DIAGNOSIS — Z0000 Encounter for general adult medical examination without abnormal findings: Secondary | ICD-10-CM | POA: Diagnosis not present

## 2016-12-20 DIAGNOSIS — I10 Essential (primary) hypertension: Secondary | ICD-10-CM | POA: Diagnosis not present

## 2016-12-20 DIAGNOSIS — R413 Other amnesia: Secondary | ICD-10-CM | POA: Diagnosis not present

## 2016-12-20 DIAGNOSIS — C4490 Unspecified malignant neoplasm of skin, unspecified: Secondary | ICD-10-CM | POA: Diagnosis not present

## 2016-12-20 DIAGNOSIS — I2510 Atherosclerotic heart disease of native coronary artery without angina pectoris: Secondary | ICD-10-CM | POA: Diagnosis not present

## 2016-12-20 DIAGNOSIS — E784 Other hyperlipidemia: Secondary | ICD-10-CM | POA: Diagnosis not present

## 2016-12-22 DIAGNOSIS — M159 Polyosteoarthritis, unspecified: Secondary | ICD-10-CM | POA: Diagnosis not present

## 2016-12-22 DIAGNOSIS — H02102 Unspecified ectropion of right lower eyelid: Secondary | ICD-10-CM | POA: Diagnosis not present

## 2016-12-22 DIAGNOSIS — E785 Hyperlipidemia, unspecified: Secondary | ICD-10-CM | POA: Diagnosis not present

## 2016-12-22 DIAGNOSIS — H04221 Epiphora due to insufficient drainage, right lacrimal gland: Secondary | ICD-10-CM | POA: Diagnosis not present

## 2016-12-22 DIAGNOSIS — H02002 Unspecified entropion of right lower eyelid: Secondary | ICD-10-CM | POA: Diagnosis not present

## 2016-12-22 DIAGNOSIS — I10 Essential (primary) hypertension: Secondary | ICD-10-CM | POA: Diagnosis not present

## 2016-12-22 DIAGNOSIS — H04551 Acquired stenosis of right nasolacrimal duct: Secondary | ICD-10-CM | POA: Diagnosis not present

## 2016-12-22 DIAGNOSIS — Z85828 Personal history of other malignant neoplasm of skin: Secondary | ICD-10-CM | POA: Diagnosis not present

## 2016-12-22 DIAGNOSIS — N400 Enlarged prostate without lower urinary tract symptoms: Secondary | ICD-10-CM | POA: Diagnosis not present

## 2016-12-22 DIAGNOSIS — J342 Deviated nasal septum: Secondary | ICD-10-CM | POA: Diagnosis not present

## 2016-12-22 DIAGNOSIS — H0259 Other disorders affecting eyelid function: Secondary | ICD-10-CM | POA: Diagnosis not present

## 2016-12-28 DIAGNOSIS — Z48810 Encounter for surgical aftercare following surgery on the sense organs: Secondary | ICD-10-CM | POA: Diagnosis not present

## 2016-12-28 DIAGNOSIS — Z9841 Cataract extraction status, right eye: Secondary | ICD-10-CM | POA: Diagnosis not present

## 2016-12-28 DIAGNOSIS — I10 Essential (primary) hypertension: Secondary | ICD-10-CM | POA: Diagnosis not present

## 2016-12-28 DIAGNOSIS — Z79899 Other long term (current) drug therapy: Secondary | ICD-10-CM | POA: Diagnosis not present

## 2016-12-28 DIAGNOSIS — Z7982 Long term (current) use of aspirin: Secondary | ICD-10-CM | POA: Diagnosis not present

## 2016-12-28 DIAGNOSIS — Z9842 Cataract extraction status, left eye: Secondary | ICD-10-CM | POA: Diagnosis not present

## 2016-12-28 DIAGNOSIS — Z87891 Personal history of nicotine dependence: Secondary | ICD-10-CM | POA: Diagnosis not present

## 2016-12-28 DIAGNOSIS — Z961 Presence of intraocular lens: Secondary | ICD-10-CM | POA: Diagnosis not present

## 2017-01-17 DIAGNOSIS — H524 Presbyopia: Secondary | ICD-10-CM | POA: Diagnosis not present

## 2017-01-18 DIAGNOSIS — L218 Other seborrheic dermatitis: Secondary | ICD-10-CM | POA: Diagnosis not present

## 2017-01-18 DIAGNOSIS — H02831 Dermatochalasis of right upper eyelid: Secondary | ICD-10-CM | POA: Diagnosis not present

## 2017-01-18 DIAGNOSIS — L570 Actinic keratosis: Secondary | ICD-10-CM | POA: Diagnosis not present

## 2017-01-18 DIAGNOSIS — Z85828 Personal history of other malignant neoplasm of skin: Secondary | ICD-10-CM | POA: Diagnosis not present

## 2017-01-18 DIAGNOSIS — Z48810 Encounter for surgical aftercare following surgery on the sense organs: Secondary | ICD-10-CM | POA: Diagnosis not present

## 2017-01-18 DIAGNOSIS — L821 Other seborrheic keratosis: Secondary | ICD-10-CM | POA: Diagnosis not present

## 2017-01-18 DIAGNOSIS — Z961 Presence of intraocular lens: Secondary | ICD-10-CM | POA: Diagnosis not present

## 2017-01-18 DIAGNOSIS — L219 Seborrheic dermatitis, unspecified: Secondary | ICD-10-CM | POA: Diagnosis not present

## 2017-01-18 DIAGNOSIS — Z9841 Cataract extraction status, right eye: Secondary | ICD-10-CM | POA: Diagnosis not present

## 2017-01-18 DIAGNOSIS — Z85820 Personal history of malignant melanoma of skin: Secondary | ICD-10-CM | POA: Diagnosis not present

## 2017-01-18 DIAGNOSIS — I10 Essential (primary) hypertension: Secondary | ICD-10-CM | POA: Diagnosis not present

## 2017-01-18 DIAGNOSIS — Z9842 Cataract extraction status, left eye: Secondary | ICD-10-CM | POA: Diagnosis not present

## 2017-01-18 DIAGNOSIS — Z87891 Personal history of nicotine dependence: Secondary | ICD-10-CM | POA: Diagnosis not present

## 2017-02-19 DIAGNOSIS — R69 Illness, unspecified: Secondary | ICD-10-CM | POA: Diagnosis not present

## 2017-02-19 DIAGNOSIS — C44222 Squamous cell carcinoma of skin of right ear and external auricular canal: Secondary | ICD-10-CM | POA: Diagnosis not present

## 2017-02-19 DIAGNOSIS — Z08 Encounter for follow-up examination after completed treatment for malignant neoplasm: Secondary | ICD-10-CM | POA: Diagnosis not present

## 2017-02-19 DIAGNOSIS — H902 Conductive hearing loss, unspecified: Secondary | ICD-10-CM | POA: Diagnosis not present

## 2017-02-19 DIAGNOSIS — H9190 Unspecified hearing loss, unspecified ear: Secondary | ICD-10-CM | POA: Diagnosis not present

## 2017-02-19 DIAGNOSIS — Z85828 Personal history of other malignant neoplasm of skin: Secondary | ICD-10-CM | POA: Diagnosis not present

## 2017-04-17 DIAGNOSIS — R7301 Impaired fasting glucose: Secondary | ICD-10-CM | POA: Diagnosis not present

## 2017-04-27 DIAGNOSIS — R413 Other amnesia: Secondary | ICD-10-CM | POA: Diagnosis not present

## 2017-04-30 DIAGNOSIS — R69 Illness, unspecified: Secondary | ICD-10-CM | POA: Diagnosis not present

## 2017-05-12 DIAGNOSIS — R413 Other amnesia: Secondary | ICD-10-CM | POA: Diagnosis not present

## 2017-05-12 DIAGNOSIS — G319 Degenerative disease of nervous system, unspecified: Secondary | ICD-10-CM | POA: Diagnosis not present

## 2017-05-12 DIAGNOSIS — Z9889 Other specified postprocedural states: Secondary | ICD-10-CM | POA: Diagnosis not present

## 2017-05-12 DIAGNOSIS — I6782 Cerebral ischemia: Secondary | ICD-10-CM | POA: Diagnosis not present

## 2017-05-12 DIAGNOSIS — Z8673 Personal history of transient ischemic attack (TIA), and cerebral infarction without residual deficits: Secondary | ICD-10-CM | POA: Diagnosis not present

## 2017-05-12 DIAGNOSIS — G9389 Other specified disorders of brain: Secondary | ICD-10-CM | POA: Diagnosis not present

## 2017-05-18 DIAGNOSIS — G3184 Mild cognitive impairment, so stated: Secondary | ICD-10-CM | POA: Diagnosis not present

## 2017-07-25 DIAGNOSIS — I2510 Atherosclerotic heart disease of native coronary artery without angina pectoris: Secondary | ICD-10-CM | POA: Diagnosis not present

## 2017-07-25 DIAGNOSIS — I10 Essential (primary) hypertension: Secondary | ICD-10-CM | POA: Diagnosis not present

## 2017-07-25 DIAGNOSIS — R2689 Other abnormalities of gait and mobility: Secondary | ICD-10-CM | POA: Diagnosis not present

## 2017-07-25 DIAGNOSIS — I638 Other cerebral infarction: Secondary | ICD-10-CM | POA: Diagnosis not present

## 2017-07-25 DIAGNOSIS — Z6825 Body mass index (BMI) 25.0-25.9, adult: Secondary | ICD-10-CM | POA: Diagnosis not present

## 2017-07-25 DIAGNOSIS — E784 Other hyperlipidemia: Secondary | ICD-10-CM | POA: Diagnosis not present

## 2017-08-01 DIAGNOSIS — Z85828 Personal history of other malignant neoplasm of skin: Secondary | ICD-10-CM | POA: Diagnosis not present

## 2017-08-01 DIAGNOSIS — Z85820 Personal history of malignant melanoma of skin: Secondary | ICD-10-CM | POA: Diagnosis not present

## 2017-08-01 DIAGNOSIS — L570 Actinic keratosis: Secondary | ICD-10-CM | POA: Diagnosis not present

## 2017-08-01 DIAGNOSIS — C44222 Squamous cell carcinoma of skin of right ear and external auricular canal: Secondary | ICD-10-CM | POA: Diagnosis not present

## 2017-08-01 DIAGNOSIS — C44519 Basal cell carcinoma of skin of other part of trunk: Secondary | ICD-10-CM | POA: Diagnosis not present

## 2017-08-01 DIAGNOSIS — L219 Seborrheic dermatitis, unspecified: Secondary | ICD-10-CM | POA: Diagnosis not present

## 2017-08-01 DIAGNOSIS — L821 Other seborrheic keratosis: Secondary | ICD-10-CM | POA: Diagnosis not present

## 2017-08-01 DIAGNOSIS — L814 Other melanin hyperpigmentation: Secondary | ICD-10-CM | POA: Diagnosis not present

## 2017-08-01 DIAGNOSIS — I781 Nevus, non-neoplastic: Secondary | ICD-10-CM | POA: Diagnosis not present

## 2017-08-01 DIAGNOSIS — D0439 Carcinoma in situ of skin of other parts of face: Secondary | ICD-10-CM | POA: Diagnosis not present

## 2017-08-01 DIAGNOSIS — D0421 Carcinoma in situ of skin of right ear and external auricular canal: Secondary | ICD-10-CM | POA: Diagnosis not present

## 2017-08-16 DIAGNOSIS — R69 Illness, unspecified: Secondary | ICD-10-CM | POA: Diagnosis not present

## 2017-09-12 DIAGNOSIS — C44222 Squamous cell carcinoma of skin of right ear and external auricular canal: Secondary | ICD-10-CM | POA: Diagnosis not present

## 2017-09-12 DIAGNOSIS — H6122 Impacted cerumen, left ear: Secondary | ICD-10-CM | POA: Diagnosis not present

## 2017-09-12 DIAGNOSIS — H903 Sensorineural hearing loss, bilateral: Secondary | ICD-10-CM | POA: Diagnosis not present

## 2017-09-12 DIAGNOSIS — R69 Illness, unspecified: Secondary | ICD-10-CM | POA: Diagnosis not present

## 2017-09-19 DIAGNOSIS — Z888 Allergy status to other drugs, medicaments and biological substances status: Secondary | ICD-10-CM | POA: Diagnosis not present

## 2017-09-19 DIAGNOSIS — Z85820 Personal history of malignant melanoma of skin: Secondary | ICD-10-CM | POA: Diagnosis not present

## 2017-09-19 DIAGNOSIS — C44519 Basal cell carcinoma of skin of other part of trunk: Secondary | ICD-10-CM | POA: Diagnosis not present

## 2017-09-19 DIAGNOSIS — Z85828 Personal history of other malignant neoplasm of skin: Secondary | ICD-10-CM | POA: Diagnosis not present

## 2017-09-19 DIAGNOSIS — C44222 Squamous cell carcinoma of skin of right ear and external auricular canal: Secondary | ICD-10-CM | POA: Diagnosis not present

## 2017-09-19 DIAGNOSIS — D0439 Carcinoma in situ of skin of other parts of face: Secondary | ICD-10-CM | POA: Diagnosis not present

## 2017-09-19 DIAGNOSIS — D0421 Carcinoma in situ of skin of right ear and external auricular canal: Secondary | ICD-10-CM | POA: Diagnosis not present

## 2017-10-01 DIAGNOSIS — Z4889 Encounter for other specified surgical aftercare: Secondary | ICD-10-CM | POA: Diagnosis not present

## 2017-10-01 DIAGNOSIS — L570 Actinic keratosis: Secondary | ICD-10-CM | POA: Diagnosis not present

## 2017-10-01 DIAGNOSIS — Z4801 Encounter for change or removal of surgical wound dressing: Secondary | ICD-10-CM | POA: Diagnosis not present

## 2017-10-08 DIAGNOSIS — Z4889 Encounter for other specified surgical aftercare: Secondary | ICD-10-CM | POA: Diagnosis not present

## 2017-10-08 DIAGNOSIS — Z483 Aftercare following surgery for neoplasm: Secondary | ICD-10-CM | POA: Diagnosis not present

## 2017-10-08 DIAGNOSIS — C44222 Squamous cell carcinoma of skin of right ear and external auricular canal: Secondary | ICD-10-CM | POA: Diagnosis not present

## 2017-10-08 DIAGNOSIS — Z4801 Encounter for change or removal of surgical wound dressing: Secondary | ICD-10-CM | POA: Diagnosis not present

## 2017-11-30 DIAGNOSIS — G3184 Mild cognitive impairment, so stated: Secondary | ICD-10-CM | POA: Diagnosis not present

## 2017-12-12 DIAGNOSIS — S83241A Other tear of medial meniscus, current injury, right knee, initial encounter: Secondary | ICD-10-CM | POA: Diagnosis not present

## 2017-12-18 DIAGNOSIS — S83241D Other tear of medial meniscus, current injury, right knee, subsequent encounter: Secondary | ICD-10-CM | POA: Diagnosis not present

## 2017-12-18 DIAGNOSIS — M25561 Pain in right knee: Secondary | ICD-10-CM | POA: Diagnosis not present

## 2017-12-18 DIAGNOSIS — R269 Unspecified abnormalities of gait and mobility: Secondary | ICD-10-CM | POA: Diagnosis not present

## 2017-12-19 DIAGNOSIS — H903 Sensorineural hearing loss, bilateral: Secondary | ICD-10-CM | POA: Diagnosis not present

## 2017-12-25 DIAGNOSIS — R269 Unspecified abnormalities of gait and mobility: Secondary | ICD-10-CM | POA: Diagnosis not present

## 2017-12-25 DIAGNOSIS — S83241D Other tear of medial meniscus, current injury, right knee, subsequent encounter: Secondary | ICD-10-CM | POA: Diagnosis not present

## 2017-12-25 DIAGNOSIS — M25561 Pain in right knee: Secondary | ICD-10-CM | POA: Diagnosis not present

## 2017-12-27 DIAGNOSIS — H903 Sensorineural hearing loss, bilateral: Secondary | ICD-10-CM | POA: Diagnosis not present

## 2017-12-31 DIAGNOSIS — R269 Unspecified abnormalities of gait and mobility: Secondary | ICD-10-CM | POA: Diagnosis not present

## 2017-12-31 DIAGNOSIS — S83241D Other tear of medial meniscus, current injury, right knee, subsequent encounter: Secondary | ICD-10-CM | POA: Diagnosis not present

## 2017-12-31 DIAGNOSIS — M25561 Pain in right knee: Secondary | ICD-10-CM | POA: Diagnosis not present

## 2018-01-09 DIAGNOSIS — R269 Unspecified abnormalities of gait and mobility: Secondary | ICD-10-CM | POA: Diagnosis not present

## 2018-01-09 DIAGNOSIS — S83241D Other tear of medial meniscus, current injury, right knee, subsequent encounter: Secondary | ICD-10-CM | POA: Diagnosis not present

## 2018-01-09 DIAGNOSIS — M25561 Pain in right knee: Secondary | ICD-10-CM | POA: Diagnosis not present

## 2018-01-15 DIAGNOSIS — M25561 Pain in right knee: Secondary | ICD-10-CM | POA: Diagnosis not present

## 2018-01-15 DIAGNOSIS — R269 Unspecified abnormalities of gait and mobility: Secondary | ICD-10-CM | POA: Diagnosis not present

## 2018-01-15 DIAGNOSIS — S83241D Other tear of medial meniscus, current injury, right knee, subsequent encounter: Secondary | ICD-10-CM | POA: Diagnosis not present

## 2018-01-17 DIAGNOSIS — Z125 Encounter for screening for malignant neoplasm of prostate: Secondary | ICD-10-CM | POA: Diagnosis not present

## 2018-01-17 DIAGNOSIS — E7849 Other hyperlipidemia: Secondary | ICD-10-CM | POA: Diagnosis not present

## 2018-01-17 DIAGNOSIS — R82998 Other abnormal findings in urine: Secondary | ICD-10-CM | POA: Diagnosis not present

## 2018-01-17 DIAGNOSIS — R7301 Impaired fasting glucose: Secondary | ICD-10-CM | POA: Diagnosis not present

## 2018-01-17 DIAGNOSIS — I10 Essential (primary) hypertension: Secondary | ICD-10-CM | POA: Diagnosis not present

## 2018-01-21 DIAGNOSIS — S83241D Other tear of medial meniscus, current injury, right knee, subsequent encounter: Secondary | ICD-10-CM | POA: Diagnosis not present

## 2018-01-21 DIAGNOSIS — H538 Other visual disturbances: Secondary | ICD-10-CM | POA: Diagnosis not present

## 2018-01-21 DIAGNOSIS — H5201 Hypermetropia, right eye: Secondary | ICD-10-CM | POA: Diagnosis not present

## 2018-01-21 DIAGNOSIS — R269 Unspecified abnormalities of gait and mobility: Secondary | ICD-10-CM | POA: Diagnosis not present

## 2018-01-21 DIAGNOSIS — Z961 Presence of intraocular lens: Secondary | ICD-10-CM | POA: Diagnosis not present

## 2018-01-21 DIAGNOSIS — M25561 Pain in right knee: Secondary | ICD-10-CM | POA: Diagnosis not present

## 2018-01-21 DIAGNOSIS — H52202 Unspecified astigmatism, left eye: Secondary | ICD-10-CM | POA: Diagnosis not present

## 2018-01-24 DIAGNOSIS — Z0000 Encounter for general adult medical examination without abnormal findings: Secondary | ICD-10-CM | POA: Diagnosis not present

## 2018-01-24 DIAGNOSIS — I6389 Other cerebral infarction: Secondary | ICD-10-CM | POA: Diagnosis not present

## 2018-01-24 DIAGNOSIS — N528 Other male erectile dysfunction: Secondary | ICD-10-CM | POA: Diagnosis not present

## 2018-01-24 DIAGNOSIS — N401 Enlarged prostate with lower urinary tract symptoms: Secondary | ICD-10-CM | POA: Diagnosis not present

## 2018-01-24 DIAGNOSIS — Z1389 Encounter for screening for other disorder: Secondary | ICD-10-CM | POA: Diagnosis not present

## 2018-01-24 DIAGNOSIS — I2510 Atherosclerotic heart disease of native coronary artery without angina pectoris: Secondary | ICD-10-CM | POA: Diagnosis not present

## 2018-01-24 DIAGNOSIS — Z6825 Body mass index (BMI) 25.0-25.9, adult: Secondary | ICD-10-CM | POA: Diagnosis not present

## 2018-01-24 DIAGNOSIS — N183 Chronic kidney disease, stage 3 (moderate): Secondary | ICD-10-CM | POA: Diagnosis not present

## 2018-01-24 DIAGNOSIS — R2689 Other abnormalities of gait and mobility: Secondary | ICD-10-CM | POA: Diagnosis not present

## 2018-01-24 DIAGNOSIS — C4490 Unspecified malignant neoplasm of skin, unspecified: Secondary | ICD-10-CM | POA: Diagnosis not present

## 2018-01-24 DIAGNOSIS — E875 Hyperkalemia: Secondary | ICD-10-CM | POA: Diagnosis not present

## 2018-01-24 DIAGNOSIS — E7849 Other hyperlipidemia: Secondary | ICD-10-CM | POA: Diagnosis not present

## 2018-02-12 DIAGNOSIS — M25561 Pain in right knee: Secondary | ICD-10-CM | POA: Diagnosis not present

## 2018-02-12 DIAGNOSIS — M859 Disorder of bone density and structure, unspecified: Secondary | ICD-10-CM | POA: Diagnosis not present

## 2018-02-13 DIAGNOSIS — D1801 Hemangioma of skin and subcutaneous tissue: Secondary | ICD-10-CM | POA: Diagnosis not present

## 2018-02-13 DIAGNOSIS — L219 Seborrheic dermatitis, unspecified: Secondary | ICD-10-CM | POA: Diagnosis not present

## 2018-02-13 DIAGNOSIS — L570 Actinic keratosis: Secondary | ICD-10-CM | POA: Diagnosis not present

## 2018-02-13 DIAGNOSIS — B351 Tinea unguium: Secondary | ICD-10-CM | POA: Diagnosis not present

## 2018-02-13 DIAGNOSIS — Z9109 Other allergy status, other than to drugs and biological substances: Secondary | ICD-10-CM | POA: Diagnosis not present

## 2018-02-13 DIAGNOSIS — Z85820 Personal history of malignant melanoma of skin: Secondary | ICD-10-CM | POA: Diagnosis not present

## 2018-02-13 DIAGNOSIS — L814 Other melanin hyperpigmentation: Secondary | ICD-10-CM | POA: Diagnosis not present

## 2018-02-13 DIAGNOSIS — L821 Other seborrheic keratosis: Secondary | ICD-10-CM | POA: Diagnosis not present

## 2018-02-13 DIAGNOSIS — Z85828 Personal history of other malignant neoplasm of skin: Secondary | ICD-10-CM | POA: Diagnosis not present

## 2018-02-19 DIAGNOSIS — S83241D Other tear of medial meniscus, current injury, right knee, subsequent encounter: Secondary | ICD-10-CM | POA: Diagnosis not present

## 2018-02-19 DIAGNOSIS — M25561 Pain in right knee: Secondary | ICD-10-CM | POA: Diagnosis not present

## 2018-02-19 DIAGNOSIS — R269 Unspecified abnormalities of gait and mobility: Secondary | ICD-10-CM | POA: Diagnosis not present

## 2018-02-26 DIAGNOSIS — M25561 Pain in right knee: Secondary | ICD-10-CM | POA: Diagnosis not present

## 2018-02-26 DIAGNOSIS — S83241D Other tear of medial meniscus, current injury, right knee, subsequent encounter: Secondary | ICD-10-CM | POA: Diagnosis not present

## 2018-02-26 DIAGNOSIS — R269 Unspecified abnormalities of gait and mobility: Secondary | ICD-10-CM | POA: Diagnosis not present

## 2018-03-05 DIAGNOSIS — M25561 Pain in right knee: Secondary | ICD-10-CM | POA: Diagnosis not present

## 2018-03-05 DIAGNOSIS — S83241D Other tear of medial meniscus, current injury, right knee, subsequent encounter: Secondary | ICD-10-CM | POA: Diagnosis not present

## 2018-03-05 DIAGNOSIS — R269 Unspecified abnormalities of gait and mobility: Secondary | ICD-10-CM | POA: Diagnosis not present

## 2018-03-12 DIAGNOSIS — M25561 Pain in right knee: Secondary | ICD-10-CM | POA: Diagnosis not present

## 2018-03-12 DIAGNOSIS — S83241D Other tear of medial meniscus, current injury, right knee, subsequent encounter: Secondary | ICD-10-CM | POA: Diagnosis not present

## 2018-03-12 DIAGNOSIS — R269 Unspecified abnormalities of gait and mobility: Secondary | ICD-10-CM | POA: Diagnosis not present

## 2018-03-13 DIAGNOSIS — Z483 Aftercare following surgery for neoplasm: Secondary | ICD-10-CM | POA: Diagnosis not present

## 2018-03-13 DIAGNOSIS — R1310 Dysphagia, unspecified: Secondary | ICD-10-CM | POA: Diagnosis not present

## 2018-03-13 DIAGNOSIS — C44222 Squamous cell carcinoma of skin of right ear and external auricular canal: Secondary | ICD-10-CM | POA: Diagnosis not present

## 2018-03-13 DIAGNOSIS — H905 Unspecified sensorineural hearing loss: Secondary | ICD-10-CM | POA: Diagnosis not present

## 2018-03-13 DIAGNOSIS — Z9889 Other specified postprocedural states: Secondary | ICD-10-CM | POA: Diagnosis not present

## 2018-03-13 DIAGNOSIS — H6121 Impacted cerumen, right ear: Secondary | ICD-10-CM | POA: Diagnosis not present

## 2018-03-19 DIAGNOSIS — M25561 Pain in right knee: Secondary | ICD-10-CM | POA: Diagnosis not present

## 2018-03-19 DIAGNOSIS — R269 Unspecified abnormalities of gait and mobility: Secondary | ICD-10-CM | POA: Diagnosis not present

## 2018-03-19 DIAGNOSIS — S83241D Other tear of medial meniscus, current injury, right knee, subsequent encounter: Secondary | ICD-10-CM | POA: Diagnosis not present

## 2018-03-21 DIAGNOSIS — M25561 Pain in right knee: Secondary | ICD-10-CM | POA: Diagnosis not present

## 2018-04-02 DIAGNOSIS — S83241D Other tear of medial meniscus, current injury, right knee, subsequent encounter: Secondary | ICD-10-CM | POA: Diagnosis not present

## 2018-04-02 DIAGNOSIS — M25561 Pain in right knee: Secondary | ICD-10-CM | POA: Diagnosis not present

## 2018-04-02 DIAGNOSIS — R269 Unspecified abnormalities of gait and mobility: Secondary | ICD-10-CM | POA: Diagnosis not present

## 2018-04-16 DIAGNOSIS — S83241D Other tear of medial meniscus, current injury, right knee, subsequent encounter: Secondary | ICD-10-CM | POA: Diagnosis not present

## 2018-04-16 DIAGNOSIS — M25561 Pain in right knee: Secondary | ICD-10-CM | POA: Diagnosis not present

## 2018-04-16 DIAGNOSIS — R269 Unspecified abnormalities of gait and mobility: Secondary | ICD-10-CM | POA: Diagnosis not present

## 2018-04-23 DIAGNOSIS — R269 Unspecified abnormalities of gait and mobility: Secondary | ICD-10-CM | POA: Diagnosis not present

## 2018-04-23 DIAGNOSIS — M25561 Pain in right knee: Secondary | ICD-10-CM | POA: Diagnosis not present

## 2018-04-23 DIAGNOSIS — S83241D Other tear of medial meniscus, current injury, right knee, subsequent encounter: Secondary | ICD-10-CM | POA: Diagnosis not present

## 2018-04-24 DIAGNOSIS — M25561 Pain in right knee: Secondary | ICD-10-CM | POA: Diagnosis not present

## 2018-05-07 DIAGNOSIS — R269 Unspecified abnormalities of gait and mobility: Secondary | ICD-10-CM | POA: Diagnosis not present

## 2018-05-07 DIAGNOSIS — S83241D Other tear of medial meniscus, current injury, right knee, subsequent encounter: Secondary | ICD-10-CM | POA: Diagnosis not present

## 2018-05-07 DIAGNOSIS — M25561 Pain in right knee: Secondary | ICD-10-CM | POA: Diagnosis not present

## 2018-05-08 DIAGNOSIS — M25561 Pain in right knee: Secondary | ICD-10-CM | POA: Diagnosis not present

## 2018-06-04 DIAGNOSIS — L570 Actinic keratosis: Secondary | ICD-10-CM | POA: Diagnosis not present

## 2018-06-04 DIAGNOSIS — Z85828 Personal history of other malignant neoplasm of skin: Secondary | ICD-10-CM | POA: Diagnosis not present

## 2018-06-04 DIAGNOSIS — C44321 Squamous cell carcinoma of skin of nose: Secondary | ICD-10-CM | POA: Diagnosis not present

## 2018-06-05 DIAGNOSIS — M25561 Pain in right knee: Secondary | ICD-10-CM | POA: Diagnosis not present

## 2018-06-18 DIAGNOSIS — C44321 Squamous cell carcinoma of skin of nose: Secondary | ICD-10-CM | POA: Diagnosis not present

## 2018-06-21 DIAGNOSIS — Z483 Aftercare following surgery for neoplasm: Secondary | ICD-10-CM | POA: Diagnosis not present

## 2018-06-21 DIAGNOSIS — Z7982 Long term (current) use of aspirin: Secondary | ICD-10-CM | POA: Diagnosis not present

## 2018-06-25 DIAGNOSIS — Z483 Aftercare following surgery for neoplasm: Secondary | ICD-10-CM | POA: Diagnosis not present

## 2018-06-25 DIAGNOSIS — C44321 Squamous cell carcinoma of skin of nose: Secondary | ICD-10-CM | POA: Diagnosis not present

## 2018-07-02 DIAGNOSIS — C44321 Squamous cell carcinoma of skin of nose: Secondary | ICD-10-CM | POA: Diagnosis not present

## 2018-07-02 DIAGNOSIS — R234 Changes in skin texture: Secondary | ICD-10-CM | POA: Diagnosis not present

## 2018-07-02 DIAGNOSIS — Z483 Aftercare following surgery for neoplasm: Secondary | ICD-10-CM | POA: Diagnosis not present

## 2018-07-04 DIAGNOSIS — Z7189 Other specified counseling: Secondary | ICD-10-CM | POA: Diagnosis not present

## 2018-07-04 DIAGNOSIS — G3184 Mild cognitive impairment, so stated: Secondary | ICD-10-CM | POA: Diagnosis not present

## 2018-07-10 DIAGNOSIS — Z48815 Encounter for surgical aftercare following surgery on the digestive system: Secondary | ICD-10-CM | POA: Diagnosis not present

## 2018-07-10 DIAGNOSIS — C44321 Squamous cell carcinoma of skin of nose: Secondary | ICD-10-CM | POA: Diagnosis not present

## 2018-07-26 DIAGNOSIS — Z5189 Encounter for other specified aftercare: Secondary | ICD-10-CM | POA: Diagnosis not present

## 2018-07-26 DIAGNOSIS — L7682 Other postprocedural complications of skin and subcutaneous tissue: Secondary | ICD-10-CM | POA: Diagnosis not present

## 2018-08-21 DIAGNOSIS — Z23 Encounter for immunization: Secondary | ICD-10-CM | POA: Diagnosis not present

## 2018-08-21 DIAGNOSIS — R531 Weakness: Secondary | ICD-10-CM | POA: Diagnosis not present

## 2018-08-21 DIAGNOSIS — C4490 Unspecified malignant neoplasm of skin, unspecified: Secondary | ICD-10-CM | POA: Diagnosis not present

## 2018-08-21 DIAGNOSIS — I10 Essential (primary) hypertension: Secondary | ICD-10-CM | POA: Diagnosis not present

## 2018-08-21 DIAGNOSIS — Z6825 Body mass index (BMI) 25.0-25.9, adult: Secondary | ICD-10-CM | POA: Diagnosis not present

## 2018-08-21 DIAGNOSIS — R2689 Other abnormalities of gait and mobility: Secondary | ICD-10-CM | POA: Diagnosis not present

## 2018-08-28 DIAGNOSIS — Z85828 Personal history of other malignant neoplasm of skin: Secondary | ICD-10-CM | POA: Diagnosis not present

## 2018-08-28 DIAGNOSIS — Z08 Encounter for follow-up examination after completed treatment for malignant neoplasm: Secondary | ICD-10-CM | POA: Diagnosis not present

## 2018-08-28 DIAGNOSIS — C44222 Squamous cell carcinoma of skin of right ear and external auricular canal: Secondary | ICD-10-CM | POA: Diagnosis not present

## 2018-09-12 DIAGNOSIS — Z9181 History of falling: Secondary | ICD-10-CM | POA: Diagnosis not present

## 2018-09-12 DIAGNOSIS — R269 Unspecified abnormalities of gait and mobility: Secondary | ICD-10-CM | POA: Diagnosis not present

## 2018-09-12 DIAGNOSIS — M6281 Muscle weakness (generalized): Secondary | ICD-10-CM | POA: Diagnosis not present

## 2018-09-18 DIAGNOSIS — R269 Unspecified abnormalities of gait and mobility: Secondary | ICD-10-CM | POA: Diagnosis not present

## 2018-09-18 DIAGNOSIS — Z9181 History of falling: Secondary | ICD-10-CM | POA: Diagnosis not present

## 2018-09-18 DIAGNOSIS — M6281 Muscle weakness (generalized): Secondary | ICD-10-CM | POA: Diagnosis not present

## 2018-09-25 DIAGNOSIS — Z9181 History of falling: Secondary | ICD-10-CM | POA: Diagnosis not present

## 2018-09-25 DIAGNOSIS — M6281 Muscle weakness (generalized): Secondary | ICD-10-CM | POA: Diagnosis not present

## 2018-09-25 DIAGNOSIS — R269 Unspecified abnormalities of gait and mobility: Secondary | ICD-10-CM | POA: Diagnosis not present

## 2018-10-02 DIAGNOSIS — Z9181 History of falling: Secondary | ICD-10-CM | POA: Diagnosis not present

## 2018-10-02 DIAGNOSIS — M6281 Muscle weakness (generalized): Secondary | ICD-10-CM | POA: Diagnosis not present

## 2018-10-02 DIAGNOSIS — R269 Unspecified abnormalities of gait and mobility: Secondary | ICD-10-CM | POA: Diagnosis not present

## 2018-10-16 DIAGNOSIS — Z9181 History of falling: Secondary | ICD-10-CM | POA: Diagnosis not present

## 2018-10-16 DIAGNOSIS — M6281 Muscle weakness (generalized): Secondary | ICD-10-CM | POA: Diagnosis not present

## 2018-10-16 DIAGNOSIS — R269 Unspecified abnormalities of gait and mobility: Secondary | ICD-10-CM | POA: Diagnosis not present

## 2018-12-03 DIAGNOSIS — Z85828 Personal history of other malignant neoplasm of skin: Secondary | ICD-10-CM | POA: Diagnosis not present

## 2018-12-03 DIAGNOSIS — L570 Actinic keratosis: Secondary | ICD-10-CM | POA: Diagnosis not present

## 2019-02-27 DIAGNOSIS — Z125 Encounter for screening for malignant neoplasm of prostate: Secondary | ICD-10-CM | POA: Diagnosis not present

## 2019-02-27 DIAGNOSIS — E7849 Other hyperlipidemia: Secondary | ICD-10-CM | POA: Diagnosis not present

## 2019-02-27 DIAGNOSIS — I10 Essential (primary) hypertension: Secondary | ICD-10-CM | POA: Diagnosis not present

## 2019-02-27 DIAGNOSIS — R7301 Impaired fasting glucose: Secondary | ICD-10-CM | POA: Diagnosis not present

## 2019-02-27 DIAGNOSIS — M859 Disorder of bone density and structure, unspecified: Secondary | ICD-10-CM | POA: Diagnosis not present

## 2019-03-10 DIAGNOSIS — C4490 Unspecified malignant neoplasm of skin, unspecified: Secondary | ICD-10-CM | POA: Diagnosis not present

## 2019-03-10 DIAGNOSIS — I6389 Other cerebral infarction: Secondary | ICD-10-CM | POA: Diagnosis not present

## 2019-03-10 DIAGNOSIS — R2689 Other abnormalities of gait and mobility: Secondary | ICD-10-CM | POA: Diagnosis not present

## 2019-03-10 DIAGNOSIS — N529 Male erectile dysfunction, unspecified: Secondary | ICD-10-CM | POA: Diagnosis not present

## 2019-03-10 DIAGNOSIS — Z1331 Encounter for screening for depression: Secondary | ICD-10-CM | POA: Diagnosis not present

## 2019-03-10 DIAGNOSIS — M8580 Other specified disorders of bone density and structure, unspecified site: Secondary | ICD-10-CM | POA: Diagnosis not present

## 2019-03-10 DIAGNOSIS — Z0000 Encounter for general adult medical examination without abnormal findings: Secondary | ICD-10-CM | POA: Diagnosis not present

## 2019-03-10 DIAGNOSIS — N401 Enlarged prostate with lower urinary tract symptoms: Secondary | ICD-10-CM | POA: Diagnosis not present

## 2019-03-10 DIAGNOSIS — R269 Unspecified abnormalities of gait and mobility: Secondary | ICD-10-CM | POA: Diagnosis not present

## 2019-03-10 DIAGNOSIS — I2510 Atherosclerotic heart disease of native coronary artery without angina pectoris: Secondary | ICD-10-CM | POA: Diagnosis not present

## 2019-03-10 DIAGNOSIS — E291 Testicular hypofunction: Secondary | ICD-10-CM | POA: Diagnosis not present

## 2019-03-10 DIAGNOSIS — N183 Chronic kidney disease, stage 3 (moderate): Secondary | ICD-10-CM | POA: Diagnosis not present

## 2019-03-17 DIAGNOSIS — C44612 Basal cell carcinoma of skin of right upper limb, including shoulder: Secondary | ICD-10-CM | POA: Diagnosis not present

## 2019-03-17 DIAGNOSIS — E875 Hyperkalemia: Secondary | ICD-10-CM | POA: Diagnosis not present

## 2019-03-17 DIAGNOSIS — C44519 Basal cell carcinoma of skin of other part of trunk: Secondary | ICD-10-CM | POA: Diagnosis not present

## 2019-03-17 DIAGNOSIS — Z85828 Personal history of other malignant neoplasm of skin: Secondary | ICD-10-CM | POA: Diagnosis not present

## 2019-03-17 DIAGNOSIS — L570 Actinic keratosis: Secondary | ICD-10-CM | POA: Diagnosis not present

## 2019-03-19 DIAGNOSIS — R82998 Other abnormal findings in urine: Secondary | ICD-10-CM | POA: Diagnosis not present

## 2019-03-19 DIAGNOSIS — I10 Essential (primary) hypertension: Secondary | ICD-10-CM | POA: Diagnosis not present

## 2019-04-24 DIAGNOSIS — G301 Alzheimer's disease with late onset: Secondary | ICD-10-CM | POA: Diagnosis not present

## 2019-04-24 DIAGNOSIS — F0280 Dementia in other diseases classified elsewhere without behavioral disturbance: Secondary | ICD-10-CM | POA: Diagnosis not present

## 2019-05-01 DIAGNOSIS — G301 Alzheimer's disease with late onset: Secondary | ICD-10-CM | POA: Diagnosis not present

## 2019-05-01 DIAGNOSIS — R278 Other lack of coordination: Secondary | ICD-10-CM | POA: Diagnosis not present

## 2019-05-01 DIAGNOSIS — Z9181 History of falling: Secondary | ICD-10-CM | POA: Diagnosis not present

## 2019-05-01 DIAGNOSIS — R2689 Other abnormalities of gait and mobility: Secondary | ICD-10-CM | POA: Diagnosis not present

## 2019-06-02 DIAGNOSIS — H9193 Unspecified hearing loss, bilateral: Secondary | ICD-10-CM | POA: Diagnosis not present

## 2019-06-02 DIAGNOSIS — H905 Unspecified sensorineural hearing loss: Secondary | ICD-10-CM | POA: Diagnosis not present

## 2019-06-03 ENCOUNTER — Other Ambulatory Visit: Payer: Self-pay

## 2019-06-03 ENCOUNTER — Emergency Department (HOSPITAL_COMMUNITY)
Admission: EM | Admit: 2019-06-03 | Discharge: 2019-06-03 | Disposition: A | Discharge status: Home / Self Care | Payer: PPO | Attending: Emergency Medicine | Admitting: Emergency Medicine

## 2019-06-03 ENCOUNTER — Encounter (HOSPITAL_COMMUNITY): Payer: Self-pay | Admitting: Emergency Medicine

## 2019-06-03 ENCOUNTER — Emergency Department (HOSPITAL_COMMUNITY): Payer: PPO

## 2019-06-03 DIAGNOSIS — Z23 Encounter for immunization: Secondary | ICD-10-CM | POA: Diagnosis not present

## 2019-06-03 DIAGNOSIS — Y9389 Activity, other specified: Secondary | ICD-10-CM | POA: Insufficient documentation

## 2019-06-03 DIAGNOSIS — S0081XA Abrasion of other part of head, initial encounter: Secondary | ICD-10-CM | POA: Diagnosis not present

## 2019-06-03 DIAGNOSIS — S0990XA Unspecified injury of head, initial encounter: Secondary | ICD-10-CM | POA: Diagnosis not present

## 2019-06-03 DIAGNOSIS — Z7982 Long term (current) use of aspirin: Secondary | ICD-10-CM | POA: Insufficient documentation

## 2019-06-03 DIAGNOSIS — Y998 Other external cause status: Secondary | ICD-10-CM | POA: Insufficient documentation

## 2019-06-03 DIAGNOSIS — I10 Essential (primary) hypertension: Secondary | ICD-10-CM | POA: Diagnosis not present

## 2019-06-03 DIAGNOSIS — Y92480 Sidewalk as the place of occurrence of the external cause: Secondary | ICD-10-CM | POA: Diagnosis not present

## 2019-06-03 DIAGNOSIS — S199XXA Unspecified injury of neck, initial encounter: Secondary | ICD-10-CM | POA: Diagnosis not present

## 2019-06-03 DIAGNOSIS — W01198A Fall on same level from slipping, tripping and stumbling with subsequent striking against other object, initial encounter: Secondary | ICD-10-CM | POA: Diagnosis not present

## 2019-06-03 DIAGNOSIS — S0993XA Unspecified injury of face, initial encounter: Secondary | ICD-10-CM | POA: Diagnosis not present

## 2019-06-03 DIAGNOSIS — Z87891 Personal history of nicotine dependence: Secondary | ICD-10-CM | POA: Insufficient documentation

## 2019-06-03 DIAGNOSIS — R220 Localized swelling, mass and lump, head: Secondary | ICD-10-CM | POA: Diagnosis not present

## 2019-06-03 DIAGNOSIS — S01111A Laceration without foreign body of right eyelid and periocular area, initial encounter: Secondary | ICD-10-CM | POA: Insufficient documentation

## 2019-06-03 DIAGNOSIS — Z85828 Personal history of other malignant neoplasm of skin: Secondary | ICD-10-CM | POA: Insufficient documentation

## 2019-06-03 DIAGNOSIS — S01411A Laceration without foreign body of right cheek and temporomandibular area, initial encounter: Secondary | ICD-10-CM | POA: Diagnosis not present

## 2019-06-03 DIAGNOSIS — Z79899 Other long term (current) drug therapy: Secondary | ICD-10-CM | POA: Insufficient documentation

## 2019-06-03 DIAGNOSIS — S0181XA Laceration without foreign body of other part of head, initial encounter: Secondary | ICD-10-CM

## 2019-06-03 MED ORDER — BACITRACIN ZINC 500 UNIT/GM EX OINT
TOPICAL_OINTMENT | Freq: Once | CUTANEOUS | Status: AC
  Administered 2019-06-03: 11:00:00 via TOPICAL

## 2019-06-03 MED ORDER — BACITRACIN ZINC 500 UNIT/GM EX OINT: 1.0000 "application " | TOPICAL_OINTMENT | Freq: Two times a day (BID) | CUTANEOUS | 0 refills | Status: DC

## 2019-06-03 MED ORDER — TETANUS-DIPHTH-ACELL PERTUSSIS 5-2.5-18.5 LF-MCG/0.5 IM SUSP
0.5000 mL | Freq: Once | INTRAMUSCULAR | Status: AC
  Administered 2019-06-03: 0.5 mL via INTRAMUSCULAR
  Filled 2019-06-03: qty 0.5

## 2019-06-03 MED ORDER — LIDOCAINE HCL (PF) 1 % IJ SOLN
10.0000 mL | Freq: Once | INTRAMUSCULAR | Status: AC
  Administered 2019-06-03: 10 mL
  Filled 2019-06-03: qty 10

## 2019-06-03 NOTE — ED Triage Notes (Signed)
Pt reports he was going about his morning walk when he tripped and fell striking his face on the ground. Pt denies LOC and his wife advised the same. Pt does have dementia but facility staff were with him on his walk and told his wife he didn't loose consciousness.

## 2019-06-03 NOTE — ED Notes (Signed)
Wounds cleaned and dressed. Pt and wife verbalized understanding of discharge paperwork. Follow-up care reviewed.

## 2019-06-03 NOTE — ED Provider Notes (Signed)
[facility] EMERGENCY DEPARTMENT Provider Note   CSN: [id number] Arrival date & time: 06/03/19  8250    History   Chief Complaint Chief Complaint  Patient presents with   Fall    HPI Jeffrey [name] is a 78 y.o. male with history of hypertension, hypercholesterolemia who presents for evaluation of head injury following fall.  Patient reports he was leaving doctor's office and walked a little too fast on the sidewalk and tripped and hit his face on the pavement.  He did not lose consciousness.  He is not anticoagulated.  He reports scrapes on bilateral hands and knees, but denies any pain there.  His tetanus is not up-to-date.  He has pain and swelling to his right eye and bleeding from his laceration below the eye and nose.  He denies any neck or back pain.  He also denies any chest pain, shortness of breath, abdominal pain, nausea, vomiting.     HPI  Past Medical History:  Diagnosis Date   Colon polyps    Diverticulosis    Hypercholesterolemia    Hypertension    Internal hemorrhoids     Patient Active Problem List   Diagnosis Date Noted   HYPERGLYCEMIA 11/10/2008   LOCALIZED SUPERFICIAL SWELLING MASS OR LUMP 03/31/2008   MELANOMA OF SKIN, SITE UNSPECIFIED 02/11/2008   HYPERLIPIDEMIA 02/11/2008   HYPERTENSION, ESSENTIAL NOS 02/11/2008   HYPERPLASIA PROSTATE UNS W/O UR OBST & OTH LUTS 02/11/2008   SKIN CANCER, HX OF 02/11/2008   GEN OSTEOARTHROSIS INVOLVING MULTIPLE SITES 10/03/2007   PES PLANUS 10/03/2007    Past Surgical History:  Procedure Laterality Date   EYE SURGERY     X 4   HERNIA REPAIR  2006   abdominal   SQUAMOUS CELL CARCINOMA EXCISION     preauricular on the right   [place] Medications    Prior to Admission medications   Medication Sig Start Date End Date Taking? Authorizing Provider  aspirin 81 MG chewable tablet Chew by mouth daily.   Yes [provider]  atorvastatin (LIPITOR) 20 MG tablet Take 20 mg by mouth daily at 6 PM.    Yes [provider]  donepezil (ARICEPT) 10 MG tablet Take 20 mg by mouth at bedtime. 04/24/19  Yes [provider]  Multiple Vitamin (MULTIVITAMIN) tablet Take 1 tablet by mouth daily.   Yes [provider]  ramipril (ALTACE) 5 MG capsule Take 2.5 mg by mouth daily.    Yes [provider]  bacitracin ointment Apply 1 application topically 2 (two) times daily. 06/03/19   Sherwood Gambler, MD    Family History Family History  Problem Relation Age of Onset   Heart disease Father    Colon cancer Neg Hx    Stomach cancer Neg Hx    Esophageal cancer Neg Hx    Rectal cancer Neg Hx    Liver cancer Neg Hx     Social History Social History   Tobacco Use   Smoking status: Former Smoker   Smokeless tobacco: Never Used  Substance Use Topics   Alcohol use: Yes    Comment: limited   Drug use: No     Allergies   Other   Review of Systems Review of Systems  Constitutional: Negative for chills and fever.  HENT: Negative for facial swelling and sore throat.   Respiratory: Negative for shortness of breath.   Cardiovascular:  Negative for chest pain.  Gastrointestinal: Negative for abdominal pain, nausea and vomiting.  Genitourinary: Negative for dysuria.  Musculoskeletal: Negative for back pain and neck pain.  Skin: Positive for wound. Negative for rash.  Neurological: Negative for syncope and headaches.  Psychiatric/Behavioral: The patient is not nervous/anxious.      Physical Exam Updated Vital Signs BP (!) 167/107 (BP Location: Right Arm)    Pulse 78    Temp 98 F (36.7 C) (Oral)    Resp 17    Ht 6' (1.829 m)    Wt 82.1 kg    SpO2 96%    BMI 24.55 kg/m   Physical Exam Vitals signs and nursing note reviewed.  Constitutional:      General: He is not in acute distress.    Appearance: He is well-developed. He is not diaphoretic.  HENT:     Head:  Normocephalic and atraumatic.      Nose:     Right Nostril: Epistaxis (dried, no active) present.     Left Nostril: No epistaxis.     Mouth/Throat:     Pharynx: No oropharyngeal exudate.  Eyes:     General: No scleral icterus.       Right eye: No discharge.        Left eye: No discharge.     Conjunctiva/sclera: Conjunctivae normal.     Pupils: Pupils are equal, round, and reactive to light.  Neck:     Musculoskeletal: Normal range of motion and neck supple.     Thyroid: No thyromegaly.  Cardiovascular:     Rate and Rhythm: Normal rate and regular rhythm.     Heart sounds: Normal heart sounds. No murmur. No friction rub. No gallop.   Pulmonary:     Effort: Pulmonary effort is normal. No respiratory distress.     Breath sounds: Normal breath sounds. No stridor. No wheezing or rales.  Chest:     Chest wall: No tenderness.  Abdominal:     General: Bowel sounds are normal. There is no distension.     Palpations: Abdomen is soft.     Tenderness: There is no abdominal tenderness. There is no guarding or rebound.  Musculoskeletal:     Comments: No midline cervical, thoracic, or lumbar tenderness  Lymphadenopathy:     Cervical: No cervical adenopathy.  Skin:    General: Skin is warm and dry.     Coloration: Skin is not pale.     Findings: No rash.     Comments: Abrasions and minor skin tears to bilateral hands on the dorsal aspect, no tenderness to the hands Very superficial abrasion to the left knee without bony tenderness or active bleeding  Neurological:     Mental Status: He is alert.     Coordination: Coordination normal.     Comments: CN 3-12 intact; normal sensation throughout; 5/5 strength in all 4 extremities; equal bilateral grip strength      ED Treatments / Results  Labs (all labs ordered are listed, but only abnormal results are displayed) Labs Reviewed - No data to display  EKG None  Radiology Ct Head Wo Contrast  Result Date: 06/03/2019 CLINICAL DATA:   Fall, hit head on sidewalk. EXAM: CT HEAD WITHOUT CONTRAST CT MAXILLOFACIAL WITHOUT CONTRAST CT CERVICAL SPINE WITHOUT CONTRAST TECHNIQUE: Multidetector CT imaging of the head, cervical spine, and maxillofacial structures were performed using the standard protocol without intravenous contrast. Multiplanar CT image reconstructions of the cervical spine and maxillofacial structures were also generated. COMPARISON:  Maxillofacial CT 03/12/2015.  CT head 11/03/2014 FINDINGS: CT HEAD FINDINGS Brain: There is atrophy and chronic small vessel disease changes. No acute intracranial abnormality. Specifically, no hemorrhage, hydrocephalus, mass lesion, acute infarction, or significant intracranial injury. Vascular: No hyperdense vessel or unexpected calcification. Skull: No acute calvarial abnormality. Other: Metallic structure noted in the anterior right orbit, presumably some sort of implant. Soft tissue swelling over the right forehead, orbit and face. CT MAXILLOFACIAL FINDINGS Osseous: No fracture or mandibular dislocation. No destructive process. Orbits: Metallic structure noted anterior to the right globe within the anterior right orbit, presumably some sort of implant. There is overlying soft tissue swelling. No visible orbital fracture. Sinuses: Clear Soft tissues: Soft tissue swelling over the right forehead, orbit and face. CT CERVICAL SPINE FINDINGS Alignment: Normal alignment. Skull base and vertebrae: No acute fracture. No primary bone lesion or focal pathologic process. Soft tissues and spinal canal: No prevertebral fluid or swelling. No visible canal hematoma. Disc levels: Mild diffuse degenerative disc and moderate degenerative facet disease. Upper chest: No acute findings Other: Carotid artery calcifications. No acute findings. Surgical clips in the right neck. IMPRESSION: Atrophy, chronic microvascular disease. No acute intracranial abnormality. No evidence of orbital, facial or cervical spine fracture.  Electronically Signed   By: Rolm Baptise M.D.   On: 06/03/2019 09:32   Ct Cervical Spine Wo Contrast  Result Date: 06/03/2019 CLINICAL DATA:  Fall, hit head on sidewalk. EXAM: CT HEAD WITHOUT CONTRAST CT MAXILLOFACIAL WITHOUT CONTRAST CT CERVICAL SPINE WITHOUT CONTRAST TECHNIQUE: Multidetector CT imaging of the head, cervical spine, and maxillofacial structures were performed using the standard protocol without intravenous contrast. Multiplanar CT image reconstructions of the cervical spine and maxillofacial structures were also generated. COMPARISON:  Maxillofacial CT 03/12/2015.  CT head 11/03/2014 FINDINGS: CT HEAD FINDINGS Brain: There is atrophy and chronic small vessel disease changes. No acute intracranial abnormality. Specifically, no hemorrhage, hydrocephalus, mass lesion, acute infarction, or significant intracranial injury. Vascular: No hyperdense vessel or unexpected calcification. Skull: No acute calvarial abnormality. Other: Metallic structure noted in the anterior right orbit, presumably some sort of implant. Soft tissue swelling over the right forehead, orbit and face. CT MAXILLOFACIAL FINDINGS Osseous: No fracture or mandibular dislocation. No destructive process. Orbits: Metallic structure noted anterior to the right globe within the anterior right orbit, presumably some sort of implant. There is overlying soft tissue swelling. No visible orbital fracture. Sinuses: Clear Soft tissues: Soft tissue swelling over the right forehead, orbit and face. CT CERVICAL SPINE FINDINGS Alignment: Normal alignment. Skull base and vertebrae: No acute fracture. No primary bone lesion or focal pathologic process. Soft tissues and spinal canal: No prevertebral fluid or swelling. No visible canal hematoma. Disc levels: Mild diffuse degenerative disc and moderate degenerative facet disease. Upper chest: No acute findings Other: Carotid artery calcifications. No acute findings. Surgical clips in the right neck.  IMPRESSION: Atrophy, chronic microvascular disease. No acute intracranial abnormality. No evidence of orbital, facial or cervical spine fracture. Electronically Signed   By: Rolm Baptise M.D.   On: 06/03/2019 09:32   Ct Maxillofacial Wo Contrast  Result Date: 06/03/2019 CLINICAL DATA:  Fall, hit head on sidewalk. EXAM: CT HEAD WITHOUT CONTRAST CT MAXILLOFACIAL WITHOUT CONTRAST CT CERVICAL SPINE WITHOUT CONTRAST TECHNIQUE: Multidetector CT imaging of the head, cervical spine, and maxillofacial structures were performed using the standard protocol without intravenous contrast. Multiplanar CT image reconstructions of the cervical spine and maxillofacial structures were also generated. COMPARISON:  Maxillofacial CT 03/12/2015.  CT head 11/03/2014 FINDINGS: CT  HEAD FINDINGS Brain: There is atrophy and chronic small vessel disease changes. No acute intracranial abnormality. Specifically, no hemorrhage, hydrocephalus, mass lesion, acute infarction, or significant intracranial injury. Vascular: No hyperdense vessel or unexpected calcification. Skull: No acute calvarial abnormality. Other: Metallic structure noted in the anterior right orbit, presumably some sort of implant. Soft tissue swelling over the right forehead, orbit and face. CT MAXILLOFACIAL FINDINGS Osseous: No fracture or mandibular dislocation. No destructive process. Orbits: Metallic structure noted anterior to the right globe within the anterior right orbit, presumably some sort of implant. There is overlying soft tissue swelling. No visible orbital fracture. Sinuses: Clear Soft tissues: Soft tissue swelling over the right forehead, orbit and face. CT CERVICAL SPINE FINDINGS Alignment: Normal alignment. Skull base and vertebrae: No acute fracture. No primary bone lesion or focal pathologic process. Soft tissues and spinal canal: No prevertebral fluid or swelling. No visible canal hematoma. Disc levels: Mild diffuse degenerative disc and moderate  degenerative facet disease. Upper chest: No acute findings Other: Carotid artery calcifications. No acute findings. Surgical clips in the right neck. IMPRESSION: Atrophy, chronic microvascular disease. No acute intracranial abnormality. No evidence of orbital, facial or cervical spine fracture. Electronically Signed   By: Rolm Baptise M.D.   On: 06/03/2019 09:32    Procedures Procedures (including critical care time)  Medications Ordered in ED Medications  Tdap (BOOSTRIX) injection 0.5 mL (0.5 mLs Intramuscular Given 06/03/19 1009)  lidocaine (PF) (XYLOCAINE) 1 % injection 10 mL (10 mLs Infiltration Given 06/03/19 1009)  bacitracin ointment ( Topical Given 06/03/19 1041)     Initial Impression / Assessment and Plan / ED Course  I have reviewed the triage vital signs and the nursing notes.  Pertinent labs & imaging results that were available during my care of the patient were reviewed by me and considered in my medical decision making (see chart for details).        Patient presenting with swelling and laceration following mechanical fall.  CT head, C-spine, maxillofacial are negative for acute findings.  Laceration repaired by my attending, Dr. Regenia Skeeter.  See his note for procedure note.  Tetanus updated.  Wound care discussed with patient by Dr. Regenia Skeeter.  Follow-up for suture removal if needed, however absorbable sutures were used.  Patient discharged in satisfactory condition.  Final Clinical Impressions(s) / ED Diagnoses   Final diagnoses:  Facial laceration, initial encounter  Abrasion of face, initial encounter  Minor head injury, initial encounter    ED Discharge Orders         Ordered    bacitracin ointment  2 times daily     06/03/19 [address] [place], [place] 06/03/19 1545    Sherwood Gambler, MD 06/04/19 1721

## 2019-06-03 NOTE — ED Provider Notes (Signed)
  Physical Exam  BP (!) 185/104   Pulse 77   Temp 98 F (36.7 C) (Oral)   Resp (!) 22   Ht 6' (1.829 m)   Wt 82.1 kg   SpO2 95%   BMI 24.55 kg/m     ED Course/Procedures     .Marland KitchenLaceration Repair  Date/Time: 06/03/2019 10:58 AM Performed by: Sherwood Gambler, MD Authorized by: Sherwood Gambler, MD   Consent:    Consent obtained:  Verbal   Consent given by:  Patient Laceration details:    Location:  Face   Face location:  R cheek   Length (cm):  3 Repair type:    Repair type:  Intermediate Pre-procedure details:    Preparation:  Patient was prepped and draped in usual sterile fashion and imaging obtained to evaluate for foreign bodies Skin repair:    Repair method:  Sutures   Suture size:  5-0   Suture material:  Fast-absorbing gut   Suture technique:  Simple interrupted   Number of sutures:  5 Approximation:    Approximation:  Close Post-procedure details:    Dressing:  Antibiotic ointment   Patient tolerance of procedure:  Tolerated well, no immediate complications    MDM  Patient with mechanical trip and fall.  Superficial laceration that is more of a skin tear just underneath his right thigh.  This was repaired as above.  Abrasions elsewhere and he will be given antibiotic ointment.  CTs are okay.  Discharged home with return precautions.       Sherwood Gambler, MD 06/03/19 1058

## 2019-06-05 DIAGNOSIS — I129 Hypertensive chronic kidney disease with stage 1 through stage 4 chronic kidney disease, or unspecified chronic kidney disease: Secondary | ICD-10-CM | POA: Diagnosis not present

## 2019-06-05 DIAGNOSIS — S00219A Abrasion of unspecified eyelid and periocular area, initial encounter: Secondary | ICD-10-CM | POA: Diagnosis not present

## 2019-06-05 DIAGNOSIS — R2689 Other abnormalities of gait and mobility: Secondary | ICD-10-CM | POA: Diagnosis not present

## 2019-06-05 DIAGNOSIS — N183 Chronic kidney disease, stage 3 (moderate): Secondary | ICD-10-CM | POA: Diagnosis not present

## 2019-06-20 DIAGNOSIS — Z961 Presence of intraocular lens: Secondary | ICD-10-CM | POA: Diagnosis not present

## 2019-06-20 DIAGNOSIS — H524 Presbyopia: Secondary | ICD-10-CM | POA: Diagnosis not present

## 2019-06-20 DIAGNOSIS — H52203 Unspecified astigmatism, bilateral: Secondary | ICD-10-CM | POA: Diagnosis not present

## 2019-06-20 DIAGNOSIS — H5201 Hypermetropia, right eye: Secondary | ICD-10-CM | POA: Diagnosis not present

## 2019-07-01 DIAGNOSIS — W19XXXD Unspecified fall, subsequent encounter: Secondary | ICD-10-CM | POA: Diagnosis not present

## 2019-07-01 DIAGNOSIS — G3184 Mild cognitive impairment, so stated: Secondary | ICD-10-CM | POA: Diagnosis not present

## 2019-08-19 DIAGNOSIS — R413 Other amnesia: Secondary | ICD-10-CM | POA: Diagnosis not present

## 2019-08-19 DIAGNOSIS — Z7189 Other specified counseling: Secondary | ICD-10-CM | POA: Diagnosis not present

## 2019-08-19 DIAGNOSIS — F0280 Dementia in other diseases classified elsewhere without behavioral disturbance: Secondary | ICD-10-CM | POA: Diagnosis not present

## 2019-08-19 DIAGNOSIS — G301 Alzheimer's disease with late onset: Secondary | ICD-10-CM | POA: Diagnosis not present

## 2019-08-20 ENCOUNTER — Other Ambulatory Visit: Payer: Self-pay

## 2019-08-20 DIAGNOSIS — Z20822 Contact with and (suspected) exposure to covid-19: Secondary | ICD-10-CM

## 2019-08-20 DIAGNOSIS — Z20828 Contact with and (suspected) exposure to other viral communicable diseases: Secondary | ICD-10-CM | POA: Diagnosis not present

## 2019-08-21 DIAGNOSIS — N183 Chronic kidney disease, stage 3 unspecified: Secondary | ICD-10-CM | POA: Diagnosis not present

## 2019-08-21 DIAGNOSIS — R9431 Abnormal electrocardiogram [ECG] [EKG]: Secondary | ICD-10-CM | POA: Diagnosis not present

## 2019-08-21 DIAGNOSIS — I10 Essential (primary) hypertension: Secondary | ICD-10-CM | POA: Diagnosis not present

## 2019-08-21 DIAGNOSIS — R55 Syncope and collapse: Secondary | ICD-10-CM | POA: Diagnosis not present

## 2019-08-21 DIAGNOSIS — I6529 Occlusion and stenosis of unspecified carotid artery: Secondary | ICD-10-CM | POA: Diagnosis not present

## 2019-08-21 DIAGNOSIS — I129 Hypertensive chronic kidney disease with stage 1 through stage 4 chronic kidney disease, or unspecified chronic kidney disease: Secondary | ICD-10-CM | POA: Diagnosis not present

## 2019-08-21 LAB — NOVEL CORONAVIRUS, NAA: SARS-CoV-2, NAA: NOT DETECTED

## 2019-08-22 ENCOUNTER — Other Ambulatory Visit: Payer: Self-pay

## 2019-08-22 ENCOUNTER — Encounter: Payer: Self-pay | Admitting: Cardiology

## 2019-08-22 ENCOUNTER — Ambulatory Visit (INDEPENDENT_AMBULATORY_CARE_PROVIDER_SITE_OTHER): Payer: PPO | Admitting: Cardiology

## 2019-08-22 VITALS — BP 100/70 | HR 78 | Temp 97.8°F | Ht 72.0 in | Wt 183.0 lb

## 2019-08-22 DIAGNOSIS — N183 Chronic kidney disease, stage 3 unspecified: Secondary | ICD-10-CM

## 2019-08-22 DIAGNOSIS — R0989 Other specified symptoms and signs involving the circulatory and respiratory systems: Secondary | ICD-10-CM

## 2019-08-22 DIAGNOSIS — R55 Syncope and collapse: Secondary | ICD-10-CM | POA: Diagnosis not present

## 2019-08-22 DIAGNOSIS — R9431 Abnormal electrocardiogram [ECG] [EKG]: Secondary | ICD-10-CM | POA: Diagnosis not present

## 2019-08-22 NOTE — Progress Notes (Signed)
Primary Physician:  Crist Infante, MD   Patient ID: Jeffrey [name], male    DOB: 09/13/1941, 78 y.o.   MRN: [id number]  Subjective:    Chief Complaint  Patient presents with  . Dizziness  . Loss of Consciousness  . Abnormal ECG  . New Patient (Initial Visit)    HPI: Jeffrey [name]  is a 78 y.o. male  with hypertension, kidney disease stage III,  hyperlipidemia, referred to [place] for evaluation of episodic near syncope and abnormal EKG on an urgent basis by Dr. Sharlett Iles.  Over the last few years, patient has had episodes if he pushes himself that he suddenly loses his balance and has gait instability.  He has sustained a fall in July causing a laceration to his face due to this.  He has altered his activities because of his symptoms.  His most recent episode was 2 days ago while walking around his facility, [facility], that he suddenly had gait instability and had to sit down. No complete syncope. No palpitations, chest pain, or shortness of breath.  Patient states that otherwise he is without any symptoms.  Patient does have some dementia, but is still able to care for himself and drive.  His wife is present at bedside.  Reports that hypertension has been well controlled, he does monitor his blood pressure at home and is generally in the [id number] range systolic.  Reports having cardiac evaluation many years ago, but is unsure as to which evaluation he had.  Past Medical History:  Diagnosis Date  . Colon polyps   . Diverticulosis   . Hypercholesterolemia   . Hypertension   . Internal hemorrhoids     Past Surgical History:  Procedure Laterality Date  . EYE SURGERY     X 4  . HERNIA REPAIR  2006   abdominal  . SQUAMOUS CELL CARCINOMA EXCISION     preauricular on the right  . TONSILLECTOMY  1947  . VASECTOMY  1981    Social History   Socioeconomic History  . Marital status: Married    Spouse name: Not on file  . Number of children: 2  . Years of education: Not on file   . Highest education level: Not on file  Occupational History  . Occupation: retired  Scientific laboratory technician  . Financial resource strain: Not on file  . Food insecurity    Worry: Not on file    Inability: Not on file  . Transportation needs    Medical: Not on file    Non-medical: Not on file  Tobacco Use  . Smoking status: Former Smoker    Quit date: 08/21/1966    Years since quitting: 53.0  . Smokeless tobacco: Never Used  Substance and Sexual Activity  . Alcohol use: Yes    Comment: limited  . Drug use: No  . Sexual activity: Yes  Lifestyle  . Physical activity    Days per week: Not on file    Minutes per session: Not on file  . Stress: Not on file  Relationships  . Social Herbalist on phone: Not on file    Gets together: Not on file    Attends religious service: Not on file    Active member of club or organization: Not on file    Attends meetings of clubs or organizations: Not on file    Relationship status: Not on file  . Intimate partner violence    Fear of current or ex partner:  Not on file    Emotionally abused: Not on file    Physically abused: Not on file    Forced sexual activity: Not on file  Other Topics Concern  . Not on file  Social History Narrative  . Not on file    Review of Systems  Constitution: Negative for decreased appetite, malaise/fatigue, weight gain and weight loss.  Eyes: Negative for visual disturbance.  Cardiovascular: Negative for chest pain, claudication, dyspnea on exertion, leg swelling, orthopnea, palpitations and syncope.  Respiratory: Negative for hemoptysis and wheezing.   Endocrine: Negative for cold intolerance and heat intolerance.  Hematologic/Lymphatic: Does not bruise/bleed easily.  Skin: Negative for nail changes.  Musculoskeletal: Negative for muscle weakness and myalgias.  Gastrointestinal: Negative for abdominal pain, change in bowel habit, nausea and vomiting.  Neurological: Positive for loss of balance  (episodic). Negative for difficulty with concentration, dizziness, focal weakness and headaches.  Psychiatric/Behavioral: Negative for altered mental status and suicidal ideas.  All other systems reviewed and are negative.     Objective:  Blood pressure 100/70, pulse 78, temperature 97.8 F (36.6 C), height 6' (1.829 m), weight 183 lb (83 kg), SpO2 96 %. Body mass index is 24.82 kg/m.    Physical Exam  Constitutional: He is oriented to person, place, and time. Vital signs are normal. He appears well-developed and well-nourished.  HENT:  Head: Normocephalic and atraumatic.  Neck: Normal range of motion.  Cardiovascular: Normal rate, regular rhythm, normal heart sounds and intact distal pulses.  Pulses:      Carotid pulses are on the right side with bruit and on the left side with bruit.      Radial pulses are 2+ on the right side and 1+ on the left side.  Pulmonary/Chest: Effort normal and breath sounds normal. No accessory muscle usage. No respiratory distress.  Abdominal: Soft. Bowel sounds are normal.  Musculoskeletal: Normal range of motion.  Neurological: He is alert and oriented to person, place, and time.  Skin: Skin is warm and dry.  Vitals reviewed.  Radiology: No results found.  Laboratory examination:    CMP Latest Ref Rng & Units 11/10/2008 02/04/2008 08/21/2006  Glucose 70 - 99 mg/dL - 123(H) -  BUN 6 - 23 mg/dL 17 20 23   Creatinine 0.4 - 1.5 mg/dL 0.9 1.1 1.2  Potassium 3.5 - 5.1 meq/L 5.0 4.6 4.7  AST 0 - 37 units/L - 21 22  ALT 0 - 53 units/L - 29 22   No flowsheet data found. Lipid Panel     Component Value Date/Time   CHOL 125 02/04/2008 0746   TRIG 28 02/04/2008 0746   TRIG 19 08/21/2006 0807   HDL 42.4 02/04/2008 0746   CHOLHDL 2.9 CALC 02/04/2008 0746   VLDL 6 02/04/2008 0746   LDLCALC 77 02/04/2008 0746   HEMOGLOBIN A1C Lab Results  Component Value Date   HGBA1C 5.2 11/10/2008   TSH No results for input(s): TSH in the last 8760 hours.   PRN Meds:. Medications Discontinued During This Encounter  Medication Reason  . ramipril (ALTACE) 5 MG capsule Error  . ramipril (ALTACE) 2.5 MG capsule Discontinued by provider   Current Meds  Medication Sig  . aspirin 81 MG chewable tablet Chew by mouth daily.  Marland Kitchen atorvastatin (LIPITOR) 20 MG tablet Take 20 mg by mouth daily at 6 PM.   . bacitracin ointment Apply 1 application topically 2 (two) times daily.  . citalopram (CELEXA) 10 MG tablet Take 10 mg by mouth daily.  Marland Kitchen  donepezil (ARICEPT) 10 MG tablet Take 20 mg by mouth at bedtime.  . Multiple Vitamin (MULTIVITAMIN) tablet Take 1 tablet by mouth daily.  . niacinamide 500 MG tablet Take 500 mg by mouth 2 (two) times daily with a meal.  . [DISCONTINUED] ramipril (ALTACE) 2.5 MG capsule Take 2.5 mg by mouth daily.  . [DISCONTINUED] ramipril (ALTACE) 5 MG capsule Take 2.5 mg by mouth daily.    Current Facility-Administered Medications for the 08/22/19 encounter (Office Visit) with Miquel Dunn, NP  Medication  . 0.9 %  sodium chloride infusion    Cardiac Studies:     Assessment:   Near syncope - Plan: EKG 12-Lead  Abnormal EKG  Stage 3 chronic kidney disease, unspecified whether stage 3a or 3b CKD  Bilateral carotid bruits  EKG 08/22/2019: Normal sinus rhythm at 65 bpm, normal axis, poor R wave progression cannot exclude anterior infarct old.  Incomplete left bundle branch block.    Recommendations:   Patient referred to [place] on an urgent basis for evaluation of near syncope and abnormal EKG.  Patient over the last few years has had episodic near syncope with pushing himself.  No symptoms of angina or complaints syncope; however, has sustained injury due to his symptoms.  On physical exam, he does have bilateral carotid bruits, and left radial pulse was felt to be slightly weaker than his right which could be suggestive of subclavian stenosis; however, there was not significant blood pressure difference.  Will obtain  carotid duplex for further evaluation of bilateral carotid bruits.  Although he has not had any symptoms of angina, he does have risk factors and given his symptoms, will evaluate with Lexiscan nuclear stress testing.  He does have abnormal EKG with possible old anterior infarct and LVH.  Will obtain echocardiogram to exclude any structural abnormalities.  His blood pressure is noted to be borderline soft, this could potentially explain his symptoms as well.  I have asked him to hold his ramipril for now.  Discussed staying well-hydrated and incorporating some salt into his diet to see if this will help improve his symptoms.  Plan of care was discussed with patient's daughter, Mattie Marlin, PA over the phone who is in agreements to plan.  All questions answered.  We will see him back after the test for further recommendations and reevaluation.   *I have discussed this case with Dr. Einar Gip and he personally examined the patient and participated in formulating the plan.*   Miquel Dunn, MSN, APRN, FNP-C [facility]. [place] Office: [phone] Fax: [phone]

## 2019-08-25 ENCOUNTER — Other Ambulatory Visit: Payer: Self-pay

## 2019-08-25 ENCOUNTER — Ambulatory Visit (INDEPENDENT_AMBULATORY_CARE_PROVIDER_SITE_OTHER): Payer: PPO

## 2019-08-25 DIAGNOSIS — R9431 Abnormal electrocardiogram [ECG] [EKG]: Secondary | ICD-10-CM | POA: Diagnosis not present

## 2019-08-25 DIAGNOSIS — R55 Syncope and collapse: Secondary | ICD-10-CM | POA: Diagnosis not present

## 2019-08-29 ENCOUNTER — Other Ambulatory Visit: Payer: Self-pay

## 2019-08-29 ENCOUNTER — Ambulatory Visit: Payer: PPO

## 2019-08-29 ENCOUNTER — Ambulatory Visit (INDEPENDENT_AMBULATORY_CARE_PROVIDER_SITE_OTHER): Payer: PPO

## 2019-08-29 DIAGNOSIS — I10 Essential (primary) hypertension: Secondary | ICD-10-CM | POA: Diagnosis not present

## 2019-08-29 DIAGNOSIS — I2510 Atherosclerotic heart disease of native coronary artery without angina pectoris: Secondary | ICD-10-CM | POA: Diagnosis not present

## 2019-08-29 DIAGNOSIS — G300 Alzheimer's disease with early onset: Secondary | ICD-10-CM | POA: Diagnosis not present

## 2019-08-29 DIAGNOSIS — I6529 Occlusion and stenosis of unspecified carotid artery: Secondary | ICD-10-CM | POA: Diagnosis not present

## 2019-08-29 DIAGNOSIS — R55 Syncope and collapse: Secondary | ICD-10-CM | POA: Diagnosis not present

## 2019-08-29 DIAGNOSIS — N183 Chronic kidney disease, stage 3 unspecified: Secondary | ICD-10-CM | POA: Diagnosis not present

## 2019-08-29 DIAGNOSIS — R9431 Abnormal electrocardiogram [ECG] [EKG]: Secondary | ICD-10-CM | POA: Diagnosis not present

## 2019-08-29 DIAGNOSIS — R7301 Impaired fasting glucose: Secondary | ICD-10-CM | POA: Diagnosis not present

## 2019-08-29 DIAGNOSIS — I6389 Other cerebral infarction: Secondary | ICD-10-CM | POA: Diagnosis not present

## 2019-08-29 DIAGNOSIS — R269 Unspecified abnormalities of gait and mobility: Secondary | ICD-10-CM | POA: Diagnosis not present

## 2019-08-31 ENCOUNTER — Other Ambulatory Visit: Payer: Self-pay | Admitting: Cardiology

## 2019-08-31 DIAGNOSIS — I6523 Occlusion and stenosis of bilateral carotid arteries: Secondary | ICD-10-CM

## 2019-09-17 DIAGNOSIS — C44519 Basal cell carcinoma of skin of other part of trunk: Secondary | ICD-10-CM | POA: Diagnosis not present

## 2019-09-17 DIAGNOSIS — L570 Actinic keratosis: Secondary | ICD-10-CM | POA: Diagnosis not present

## 2019-09-19 ENCOUNTER — Encounter: Payer: Self-pay | Admitting: Cardiology

## 2019-09-19 ENCOUNTER — Ambulatory Visit: Payer: PPO

## 2019-09-19 ENCOUNTER — Ambulatory Visit (INDEPENDENT_AMBULATORY_CARE_PROVIDER_SITE_OTHER): Payer: PPO | Admitting: Cardiology

## 2019-09-19 ENCOUNTER — Other Ambulatory Visit: Payer: Self-pay

## 2019-09-19 VITALS — BP 144/83 | HR 64 | Temp 97.7°F | Ht 72.0 in | Wt 182.0 lb

## 2019-09-19 DIAGNOSIS — N182 Chronic kidney disease, stage 2 (mild): Secondary | ICD-10-CM

## 2019-09-19 DIAGNOSIS — I10 Essential (primary) hypertension: Secondary | ICD-10-CM

## 2019-09-19 DIAGNOSIS — I6523 Occlusion and stenosis of bilateral carotid arteries: Secondary | ICD-10-CM | POA: Diagnosis not present

## 2019-09-19 DIAGNOSIS — I129 Hypertensive chronic kidney disease with stage 1 through stage 4 chronic kidney disease, or unspecified chronic kidney disease: Secondary | ICD-10-CM

## 2019-09-19 DIAGNOSIS — R55 Syncope and collapse: Secondary | ICD-10-CM | POA: Diagnosis not present

## 2019-09-19 NOTE — Progress Notes (Signed)
Primary Physician:  Crist Infante, MD   Patient ID: Jeffrey [name], male    DOB: Dec 29, 1940, 78 y.o.   MRN: [id number]  Subjective:    Chief Complaint  Patient presents with  . Hypertension  . Follow-up    4 week  . Results    echo, stress, cartoid duplex    HPI: Jeffrey [name]  is a 78 y.o. male  with hypertension, kidney disease stage III,  hyperlipidemia, recently evaluated by [place] for exertional gait instability and near syncope that has been occurring over the last several years, but has recently had recurrent episode.  In view of abnormal EKG and his concerning symptoms, he underwent Lexiscan nuclear stress testing, carotid duplex, and echocardiogram.  His blood pressure was borderline low at his last office visit, ramipril was discontinued.   He has not had any recurrent symptoms since last seen by [place], but he does avoid exertional strenuous activity to prevent symptoms from occurring.  Patient does have some dementia, but is still able to care for himself and drive.  His wife is present at bedside.  They have been monitoring his blood pressure at home and consistently in the [id number] systolic range.  Reports having cardiac evaluation many years ago, but is unsure as to which evaluation he had.  Past Medical History:  Diagnosis Date  . Colon polyps   . Diverticulosis   . Hypercholesterolemia   . Hypertension   . Internal hemorrhoids     Past Surgical History:  Procedure Laterality Date  . EYE SURGERY     X 4  . HERNIA REPAIR  2006   abdominal  . SQUAMOUS CELL CARCINOMA EXCISION     preauricular on the right  . TONSILLECTOMY  1947  . VASECTOMY  1981    Social History   Socioeconomic History  . Marital status: Married    Spouse name: Not on file  . Number of children: 2  . Years of education: Not on file  . Highest education level: Not on file  Occupational History  . Occupation: retired  Scientific laboratory technician  . Financial resource strain: Not on file  . Food  insecurity    Worry: Not on file    Inability: Not on file  . Transportation needs    Medical: Not on file    Non-medical: Not on file  Tobacco Use  . Smoking status: Former Smoker    Packs/day: 0.25    Years: 3.00    Pack years: 0.75    Quit date: 08/21/1966    Years since quitting: 53.1  . Smokeless tobacco: Never Used  Substance and Sexual Activity  . Alcohol use: Yes    Comment: limited  . Drug use: No  . Sexual activity: Yes  Lifestyle  . Physical activity    Days per week: Not on file    Minutes per session: Not on file  . Stress: Not on file  Relationships  . Social Herbalist on phone: Not on file    Gets together: Not on file    Attends religious service: Not on file    Active member of club or organization: Not on file    Attends meetings of clubs or organizations: Not on file    Relationship status: Not on file  . Intimate partner violence    Fear of current or ex partner: Not on file    Emotionally abused: Not on file    Physically abused: Not on  file    Forced sexual activity: Not on file  Other Topics Concern  . Not on file  Social History Narrative  . Not on file    Review of Systems  Constitution: Negative for decreased appetite, malaise/fatigue, weight gain and weight loss.  Eyes: Negative for visual disturbance.  Cardiovascular: Negative for chest pain, claudication, dyspnea on exertion, leg swelling, orthopnea, palpitations and syncope.  Respiratory: Negative for hemoptysis and wheezing.   Endocrine: Negative for cold intolerance and heat intolerance.  Hematologic/Lymphatic: Does not bruise/bleed easily.  Skin: Negative for nail changes.  Musculoskeletal: Negative for muscle weakness and myalgias.  Gastrointestinal: Negative for abdominal pain, change in bowel habit, nausea and vomiting.  Neurological: Positive for loss of balance (episodic). Negative for difficulty with concentration, dizziness, focal weakness and headaches.   Psychiatric/Behavioral: Negative for altered mental status and suicidal ideas.  All other systems reviewed and are negative.     Objective:  Blood pressure (!) 144/83, pulse 64, temperature 97.7 F (36.5 C), height 6' (1.829 m), weight 182 lb (82.6 kg), SpO2 96 %. Body mass index is 24.68 kg/m.    Physical Exam  Constitutional: He is oriented to person, place, and time. Vital signs are normal. He appears well-developed and well-nourished.  HENT:  Head: Normocephalic and atraumatic.  Neck: Normal range of motion.  Cardiovascular: Normal rate, regular rhythm, normal heart sounds and intact distal pulses.  Pulses:      Carotid pulses are on the right side with bruit and on the left side with bruit.      Radial pulses are 2+ on the right side and 1+ on the left side.  Pulmonary/Chest: Effort normal and breath sounds normal. No accessory muscle usage. No respiratory distress.  Abdominal: Soft. Bowel sounds are normal.  Musculoskeletal: Normal range of motion.  Neurological: He is alert and oriented to person, place, and time.  Skin: Skin is warm and dry.  Vitals reviewed.  Radiology: No results found.  Laboratory examination:    CMP Latest Ref Rng & Units 11/10/2008 02/04/2008 08/21/2006  Glucose 70 - 99 mg/dL - 123(H) -  BUN 6 - 23 mg/dL 17 20 23   Creatinine 0.4 - 1.5 mg/dL 0.9 1.1 1.2  Potassium 3.5 - 5.1 meq/L 5.0 4.6 4.7  AST 0 - 37 units/L - 21 22  ALT 0 - 53 units/L - 29 22   No flowsheet data found. Lipid Panel     Component Value Date/Time   CHOL 125 02/04/2008 0746   TRIG 28 02/04/2008 0746   TRIG 19 08/21/2006 0807   HDL 42.4 02/04/2008 0746   CHOLHDL 2.9 CALC 02/04/2008 0746   VLDL 6 02/04/2008 0746   LDLCALC 77 02/04/2008 0746   HEMOGLOBIN A1C Lab Results  Component Value Date   HGBA1C 5.2 11/10/2008   TSH No results for input(s): TSH in the last 8760 hours.  PRN Meds:. There are no discontinued medications. Current Meds  Medication Sig  . aspirin  81 MG chewable tablet Chew by mouth daily.  Marland Kitchen atorvastatin (LIPITOR) 20 MG tablet Take 20 mg by mouth daily at 6 PM.   . bacitracin ointment Apply 1 application topically 2 (two) times daily.  . citalopram (CELEXA) 10 MG tablet Take 10 mg by mouth daily.  Marland Kitchen donepezil (ARICEPT) 10 MG tablet Take 20 mg by mouth at bedtime.  . Multiple Vitamin (MULTIVITAMIN) tablet Take 1 tablet by mouth daily.  . niacinamide 500 MG tablet Take 500 mg by mouth 2 (two) times daily  with a meal.   Current Facility-Administered Medications for the 09/19/19 encounter (Office Visit) with Miquel Dunn, NP  Medication  . 0.9 %  sodium chloride infusion    Cardiac Studies:   Lexiscan Myoview Stress Test 08/25/2019: 1. Resting EKG normal sinus rhythm, left axis deviation, poor R progression, IVCD.  Stress EKG nondiagnostic due to Lexiscan infusion. Hypertensive both in rest and stress with resting  BP 160/100 mm Hg.  2. Perfusion imaging study reveals the left ventricle to be normal in both rest and stress images.  There is a moderate sized mild ischemia noted in inferior and infero-apical wall extending from the base towards the apex.  Left ventricular systolic function was preserved at 53% without wall motion abnormality. This represents a low risk study in view of very minimal ischemia and normal wall motion. No previous exam available for comparison.  Echocardiogram 08/29/2019:  Normal LV systolic function with visual EF 55-60%. Normal global wall motion. Left ventricle cavity is normal in size. Moderate concentric hypertrophy of the left ventricle. Doppler evidence of grade I (impaired) diastolic dysfunction, normal LAP. Trileaflet aortic valve. Mild (Grade I) aortic regurgitation. The aortic root is normal in size. There is a complex plaque noted at the tubular aorta.  See image.  Carotid artery duplex  08/29/2019: Stenosis in the right internal carotid artery (16-49%), lower end of spectrum. Stenosis in  the right external carotid artery (<50%). Stenosis in the left internal carotid artery (16-49%), lower end of spectrum. Stenosis in the left external carotid artery (>50%). Antegrade right vertebral artery flow. Antegrade left vertebral artery flow. Follow up in one year is appropriate if clinically indicated.   Assessment:   Near syncope - Plan: Cardiac event monitor  Asymptomatic bilateral carotid artery stenosis  CKD (chronic kidney disease) stage 2, GFR 60-89 ml/min  Primary hypertension  EKG 08/22/2019: Normal sinus rhythm at 65 bpm, normal axis, poor R wave progression cannot exclude anterior infarct old.  Incomplete left bundle branch block.    Recommendations:   I have reviewed and discussed recently obtained test results with the patient.  No evidence of subclavian artery stenosis, does have mild bilateral carotid stenosis and is on appropriate medical therapy for this.  Do not suspect that this is contributing to his symptoms.  His symptoms are quite concerning as they are clearly related to exertion.  He was noted to have moderate size mild ischemia in inferior apical area concerning for potential involvement of RCA.  His symptoms could also potentially be related to exertional hypotension.  He was previously on very low-dose ramipril as he was having some weakness with higher dose of ramipril.  I will place him on 2-week event monitor, and have encouraged him to walk with a monitor to see if he has any exertional induced arrhythmias.  Depending upon event monitor results, will decide if he should proceed with coronary angiogram at that time.  I have advised the patient's wife who is present at bedside that when patient goes for his walks to be present with him, in case he was to have any exertional difficulties for safety measures.  I have encouraged him to resume taking ramipril 2.5 mg daily for his elevated blood pressure.  He has chronic kidney disease stage II-III that is been  stable.  I will plan to see him back in 3 weeks after his event monitor for follow-up.   *I have discussed this case with Dr. Einar Gip and he personally examined the patient and participated  in formulating the plan.*    Miquel Dunn, MSN, APRN, FNP-C [facility]. [place] Office: [phone] Fax: [phone]

## 2019-09-22 ENCOUNTER — Other Ambulatory Visit: Payer: PPO

## 2019-09-30 ENCOUNTER — Telehealth: Payer: Self-pay

## 2019-09-30 NOTE — Telephone Encounter (Signed)
Called pt wife Cissy due to a vm mail that she left in regards to pt heart monitor. Pt wife did not answer

## 2019-10-14 ENCOUNTER — Other Ambulatory Visit: Payer: Self-pay

## 2019-10-14 ENCOUNTER — Ambulatory Visit (INDEPENDENT_AMBULATORY_CARE_PROVIDER_SITE_OTHER): Payer: PPO | Admitting: Cardiology

## 2019-10-14 VITALS — BP 139/82 | HR 69 | Ht 72.0 in | Wt 179.0 lb

## 2019-10-14 DIAGNOSIS — I6523 Occlusion and stenosis of bilateral carotid arteries: Secondary | ICD-10-CM

## 2019-10-14 DIAGNOSIS — I10 Essential (primary) hypertension: Secondary | ICD-10-CM | POA: Diagnosis not present

## 2019-10-14 DIAGNOSIS — R001 Bradycardia, unspecified: Secondary | ICD-10-CM

## 2019-10-14 DIAGNOSIS — R55 Syncope and collapse: Secondary | ICD-10-CM

## 2019-10-14 NOTE — Progress Notes (Signed)
Primary Physician:  Crist Infante, MD   Patient ID: Jeffrey [name], male    DOB: 1941/05/08, 78 y.o.   MRN: [id number]  Subjective:    Chief Complaint  Patient presents with  . Carotid Artery Stenosis    monitor results     HPI: Jeffrey [name]  is a 78 y.o. male  with hypertension, kidney disease stage III,  hyperlipidemia, recently evaluated by [place] for exertional gait instability and near syncope that has been occurring over the last several years, but has recently had recurrent episode.  In view of abnormal EKG and his concerning symptoms, he underwent Lexiscan nuclear stress testing on 08/25/19 that showed moderate sized, mild intensity area of ischemia in the infero-apical wall, but still felt to be low risk study. Echocardiogram on 08/29/19 was essentially normal. Carotid duplex also performed at that time showed left external carotid >50% stenosis, otherwise, mild stenosis. No evidence of vertebral artery stenosis. Due to elevated blood pressure, advised to start back on Ramipril. He was placed on 2 week event monitor and encouraged to exercise to see if he would have any exercise induced arrhythmias. He now presents for follow up.  He has had one episode while walking that his wife reports was fairly mild, but did not occur while on the monitor. Patient does have some dementia, but is still able to care for himself and drive.  His wife is present at bedside.  Blood pressure has been stable.   Reports having cardiac evaluation many years ago, but is unsure as to which evaluation he had.  Past Medical History:  Diagnosis Date  . Colon polyps   . Diverticulosis   . Hypercholesterolemia   . Hypertension   . Internal hemorrhoids     Past Surgical History:  Procedure Laterality Date  . EYE SURGERY     X 4  . HERNIA REPAIR  2006   abdominal  . SQUAMOUS CELL CARCINOMA EXCISION     preauricular on the right  . TONSILLECTOMY  1947  . VASECTOMY  1981    Social History    Socioeconomic History  . Marital status: Married    Spouse name: Not on file  . Number of children: 2  . Years of education: Not on file  . Highest education level: Not on file  Occupational History  . Occupation: retired  Scientific laboratory technician  . Financial resource strain: Not on file  . Food insecurity    Worry: Not on file    Inability: Not on file  . Transportation needs    Medical: Not on file    Non-medical: Not on file  Tobacco Use  . Smoking status: Former Smoker    Packs/day: 0.25    Years: 3.00    Pack years: 0.75    Quit date: 08/21/1966    Years since quitting: 53.1  . Smokeless tobacco: Never Used  Substance and Sexual Activity  . Alcohol use: Yes    Comment: limited  . Drug use: No  . Sexual activity: Yes  Lifestyle  . Physical activity    Days per week: Not on file    Minutes per session: Not on file  . Stress: Not on file  Relationships  . Social Herbalist on phone: Not on file    Gets together: Not on file    Attends religious service: Not on file    Active member of club or organization: Not on file    Attends meetings  of clubs or organizations: Not on file    Relationship status: Not on file  . Intimate partner violence    Fear of current or ex partner: Not on file    Emotionally abused: Not on file    Physically abused: Not on file    Forced sexual activity: Not on file  Other Topics Concern  . Not on file  Social History Narrative  . Not on file    Review of Systems  Constitution: Negative for decreased appetite, malaise/fatigue, weight gain and weight loss.  Eyes: Negative for visual disturbance.  Cardiovascular: Negative for chest pain, claudication, dyspnea on exertion, leg swelling, orthopnea, palpitations and syncope.  Respiratory: Negative for hemoptysis and wheezing.   Endocrine: Negative for cold intolerance and heat intolerance.  Hematologic/Lymphatic: Does not bruise/bleed easily.  Skin: Negative for nail changes.   Musculoskeletal: Negative for muscle weakness and myalgias.  Gastrointestinal: Negative for abdominal pain, change in bowel habit, nausea and vomiting.  Neurological: Positive for loss of balance (episodic). Negative for difficulty with concentration, dizziness, focal weakness and headaches.  Psychiatric/Behavioral: Negative for altered mental status and suicidal ideas.  All other systems reviewed and are negative.     Objective:  Blood pressure 139/82, pulse 69, height 6' (1.829 m), weight 179 lb (81.2 kg), SpO2 96 %. Body mass index is 24.28 kg/m.    Physical Exam  Constitutional: He is oriented to person, place, and time. Vital signs are normal. He appears well-developed and well-nourished.  HENT:  Head: Normocephalic and atraumatic.  Neck: Normal range of motion.  Cardiovascular: Normal rate, regular rhythm, normal heart sounds and intact distal pulses.  Pulses:      Carotid pulses are on the right side with bruit and on the left side with bruit.      Radial pulses are 2+ on the right side and 1+ on the left side.  Pulmonary/Chest: Effort normal and breath sounds normal. No accessory muscle usage. No respiratory distress.  Abdominal: Soft. Bowel sounds are normal.  Musculoskeletal: Normal range of motion.  Neurological: He is alert and oriented to person, place, and time.  Skin: Skin is warm and dry.  Vitals reviewed.  Radiology: No results found.  Laboratory examination:    CMP Latest Ref Rng & Units 11/10/2008 02/04/2008 08/21/2006  Glucose 70 - 99 mg/dL - 123(H) -  BUN 6 - 23 mg/dL 17 20 23   Creatinine 0.4 - 1.5 mg/dL 0.9 1.1 1.2  Potassium 3.5 - 5.1 meq/L 5.0 4.6 4.7  AST 0 - 37 units/L - 21 22  ALT 0 - 53 units/L - 29 22   No flowsheet data found. Lipid Panel     Component Value Date/Time   CHOL 125 02/04/2008 0746   TRIG 28 02/04/2008 0746   TRIG 19 08/21/2006 0807   HDL 42.4 02/04/2008 0746   CHOLHDL 2.9 CALC 02/04/2008 0746   VLDL 6 02/04/2008 0746    LDLCALC 77 02/04/2008 0746   HEMOGLOBIN A1C Lab Results  Component Value Date   HGBA1C 5.2 11/10/2008   TSH No results for input(s): TSH in the last 8760 hours.  PRN Meds:. There are no discontinued medications. Current Meds  Medication Sig  . aspirin 81 MG chewable tablet Chew by mouth daily.  Marland Kitchen atorvastatin (LIPITOR) 20 MG tablet Take 20 mg by mouth daily at 6 PM.   . bacitracin ointment Apply 1 application topically 2 (two) times daily.  . citalopram (CELEXA) 10 MG tablet Take 10 mg by mouth daily.  Marland Kitchen  donepezil (ARICEPT) 10 MG tablet Take 20 mg by mouth at bedtime.  . Multiple Vitamin (MULTIVITAMIN) tablet Take 1 tablet by mouth daily.  . niacinamide 500 MG tablet Take 500 mg by mouth 2 (two) times daily with a meal.   Current Facility-Administered Medications for the 10/14/19 encounter (Office Visit) with Miquel Dunn, NP  Medication  . 0.9 %  sodium chloride infusion    Cardiac Studies:   14 day event monitor 11/6-11/20/20: Normal sinus rhythm. No critical events or patient triggered events. 1 episode on Day 13 at 2242 is concerning for atrial flutter vs. atrial tachycardia at 140 bpm that persisted for greater than 5 minutes. Maximum HR 141 bpm on 11/18 at 2242. Minimum HR 48 bpm on 11/14 at 0513. Episodes of marked sinus bradycardia. No high degree AV block noted.  [place] Stress Test 08/25/2019: 1. Resting EKG normal sinus rhythm, left axis deviation, poor R progression, IVCD.  Stress EKG nondiagnostic due to Lexiscan infusion. Hypertensive both in rest and stress with resting  BP 160/100 mm Hg.  2. Perfusion imaging study reveals the left ventricle to be normal in both rest and stress images.  There is a moderate sized mild ischemia noted in inferior and infero-apical wall extending from the base towards the apex.  Left ventricular systolic function was preserved at 53% without wall motion abnormality. This represents a low risk study in view of very minimal  ischemia and normal wall motion. No previous exam available for comparison.  Echocardiogram 08/29/2019:  Normal LV systolic function with visual EF 55-60%. Normal global wall motion. Left ventricle cavity is normal in size. Moderate concentric hypertrophy of the left ventricle. Doppler evidence of grade I (impaired) diastolic dysfunction, normal LAP. Trileaflet aortic valve. Mild (Grade I) aortic regurgitation. The aortic root is normal in size. There is a complex plaque noted at the tubular aorta.  See image.  Carotid artery duplex  08/29/2019: Stenosis in the right internal carotid artery (16-49%), lower end of spectrum. Stenosis in the right external carotid artery (<50%). Stenosis in the left internal carotid artery (16-49%), lower end of spectrum. Stenosis in the left external carotid artery (>50%). Antegrade right vertebral artery flow. Antegrade left vertebral artery flow. Follow up in one year is appropriate if clinically indicated.   Assessment:   Near syncope  Primary hypertension  Bradycardia  Asymptomatic bilateral carotid artery stenosis  EKG 08/22/2019: Normal sinus rhythm at 65 bpm, normal axis, poor R wave progression cannot exclude anterior infarct old.  Incomplete left bundle branch block.    Recommendations:   I have reviewed the event monitor results with the patient and his wife as well as their daughter who is a PA at [employer], he did have episodes of bradycardia on his event monitor, but did not appear to correlate with his symptoms.  He did have one episode of mild near syncope that occurred while he was not wearing the event monitor.  Question if the patient may have some underlying sick sinus syndrome that is contributing to his symptoms.  After lengthy discussion with the patient, I feel that further information is needed before deciding on further management.  I would like to place him on a 30-day event monitor for further evaluation and again encouraged him to  do regular exercise to see if he has any arrhythmias noted.  Another option would be loop recorder implantation if he is not able to have recurrent symptoms on event monitor.  Patient is agreeable to this.  He was incidentally found to have one questionable episode of atrial flutter versus atrial tachycardia that persisted for at least greater than 5 minutes.  Evaluation is limited due to artifact.  I have not started him on anticoagulation at this point in view of lack of certainty of atrial flutter.  Daughter, and patient are agreeable to this.  30-day event monitor will also help [place] determine if he is having continued episodes of paroxysmal atrial flutter and can decide on anticoagulation at that point.  I will plan to see him back after event monitor for follow-up   *I have discussed this case with Dr. Einar Gip and he personally examined the patient and participated in formulating the plan.*    Miquel Dunn, MSN, APRN, FNP-C [facility]. [place] Office: [phone] Fax: [phone]

## 2019-10-16 ENCOUNTER — Other Ambulatory Visit: Payer: Self-pay

## 2019-10-16 ENCOUNTER — Telehealth: Payer: Self-pay | Admitting: Cardiology

## 2019-10-16 DIAGNOSIS — Z20822 Contact with and (suspected) exposure to covid-19: Secondary | ICD-10-CM

## 2019-10-16 DIAGNOSIS — R55 Syncope and collapse: Secondary | ICD-10-CM

## 2019-10-16 NOTE — Telephone Encounter (Signed)
Done

## 2019-10-16 NOTE — Telephone Encounter (Signed)
Discussed possible findings of atrial flutter on event monitor. He did have an episode of near syncope with exertion on 11/20, but patient was not on monitor at that time. I will repeat 30 day event monitor for further evaluation of near syncopal episodes and also potentially atrial flutter. Wife and Daughter are in agreeance to the plan.

## 2019-10-17 ENCOUNTER — Encounter: Payer: Self-pay | Admitting: Cardiology

## 2019-10-18 LAB — NOVEL CORONAVIRUS, NAA: SARS-CoV-2, NAA: NOT DETECTED

## 2019-10-21 ENCOUNTER — Ambulatory Visit: Payer: PPO

## 2019-10-21 ENCOUNTER — Other Ambulatory Visit: Payer: Self-pay

## 2019-10-21 DIAGNOSIS — R55 Syncope and collapse: Secondary | ICD-10-CM | POA: Diagnosis not present

## 2019-11-13 ENCOUNTER — Other Ambulatory Visit: Payer: Self-pay | Admitting: Cardiology

## 2019-11-13 DIAGNOSIS — F0390 Unspecified dementia without behavioral disturbance: Secondary | ICD-10-CM

## 2019-11-13 DIAGNOSIS — R400 Somnolence: Secondary | ICD-10-CM

## 2019-11-13 DIAGNOSIS — R5383 Other fatigue: Secondary | ICD-10-CM

## 2019-11-13 NOTE — Progress Notes (Signed)
Patient has had increased fatigue and daytime sleepiness. He is currently wearing an event monitor which has incidentally found 10 mins of PSVT that was asymptomatic. He does have a history of dementia that per daughter, who is a PA with [employer], has not been evaluated by Neurology. Will make referral to Neurology for both evaluation of sleep apnea and also management of dementia.

## 2019-11-26 ENCOUNTER — Encounter: Payer: Self-pay | Admitting: Neurology

## 2019-11-26 ENCOUNTER — Other Ambulatory Visit: Payer: Self-pay

## 2019-11-26 ENCOUNTER — Ambulatory Visit: Payer: PPO | Admitting: Neurology

## 2019-11-26 VITALS — BP 113/71 | HR 64 | Temp 97.3°F | Ht 72.0 in | Wt 180.0 lb

## 2019-11-26 DIAGNOSIS — R55 Syncope and collapse: Secondary | ICD-10-CM

## 2019-11-26 DIAGNOSIS — G3184 Mild cognitive impairment, so stated: Secondary | ICD-10-CM | POA: Diagnosis not present

## 2019-11-26 DIAGNOSIS — G4730 Sleep apnea, unspecified: Secondary | ICD-10-CM

## 2019-11-26 DIAGNOSIS — G4714 Hypersomnia due to medical condition: Secondary | ICD-10-CM

## 2019-11-26 DIAGNOSIS — C760 Malignant neoplasm of head, face and neck: Secondary | ICD-10-CM

## 2019-11-26 DIAGNOSIS — R001 Bradycardia, unspecified: Secondary | ICD-10-CM

## 2019-11-26 DIAGNOSIS — G4710 Hypersomnia, unspecified: Secondary | ICD-10-CM

## 2019-11-26 NOTE — Progress Notes (Signed)
SLEEP MEDICINE CLINIC    Provider:  Larey Seat, MD  Primary Care Physician:  Crist Infante, [place] [address]     Referring Provider: Dr. Adrian Prows, MD         Chief Complaint according to patient   Patient presents with:    . New Patient (Initial Visit)     pt wife states that he has excessive daytime sleepiness and takes 3 naps a day including sleeping 9:30p-7:30 am. he also has some cardiac irregularities that the cardiologist would like to rule out OSA.      HISTORY OF PRESENT ILLNESS:  Jeffrey [name] is a 79 y.o. year old Caucasian male patient seen here upon referral by cardiology on  11/26/2019.   We will get the first and be correct.  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  Which included scapular skin flap closure.  Removal of the parotid gland the TMJ joint the lymph nodes C-section branches of the trigeminal nerve were involved and damaged surgery was followed by radiation treatments he was followed by Dr. Caryl Never at [facility].  In 2019 he had knee pain and some balance issues he did physical therapy and received 2 steroid shots to the knee.  In 2018 he had an obstructed tear ducts that needed to be repaired deviated septum and a lift of the area under the outer right eye.  This was again done by Dr. Janeal Holmes.  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  In 2020 July 21 he had a fall at [facility] hit his head had a facial laceration and felt dizzy and weak could not walk followed by an ER visit at [facility] CT negative there was no contusion laceration was treated.  In October again feeling of dizziness and weakness while walking in the morning sat down did not fall saw the nurse practitioner at Dr. Tempie Donning office who started a referral to cardiology.   He has a history of hypertension, has a history of mild dementia early stages, cardiology diagnosed him with carotid artery stenosis but also kidneys chronic kidney disease stage II, hyperlipidemia, you recently had these 2 with syncope or near syncopal episodes so he underwent a Lexiscan nuclear stress test which showed only a small area of ischemia in the inner apical wall but it was still felt to be a low risk study.  Echocardiogram was essentially normal carotid duplex showed left external carotid over 50% stenosis otherwise mild this would not be affecting his brain function.  He was placed on a 2-week cardiac monitor there were no arrhythmias but he did have bradycardia and supraventricular tachycardia.  Blood pressures have reportedly been stable.   Chief concern according to patient :  " I am so sleepy"     I have the pleasure of seeing Jeffrey [name] today, a right-handed White or Caucasian male with a possible sleep disorder.      Family medical /sleep history: no other family member on CPAP with OSA, insomnia, sleep walking.    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 Lives in a household with 2 persons at [employer]. Daughter is a PA with [employer].  Pets  are not present. Tobacco use: quit 54 years ago- in the [employer]. [place].   ETOH use none , Caffeine intake in form of Coffee( 2 cups in AM) Soda( none) Tea ( caffeinated at dinner ) or energy drinks. Regular exercise in form of - walking    Hobbies : none       Sleep habits are as follows: The patient's dinner time is between 5-6.30 PM. The patient goes to bed at 10 PM and continues to sleep for 7-8 hours, wakes for 3-4  bathroom breaks, the first time at 1 AM.   The preferred sleep position is supine and side, with the support of 2 pillows.  Dreams are reportedly rare. He belches a lot o at night.    7.30  AM is the usual rise time.  The patient wakes up spontaneously/.  He reports  feeling refreshed or restored in AM, but he naps quickly- 9.30-10 and needs to be woken-  Lunch is about 12.30 and after lunch he naps again- 45 minutes, and I later afternoon again.  Total daily sleep time over 12 hours.  They moved to [facility] 08-2018 when his memory became impaired..    Review of Systems: Out of a complete 14 system review, the patient complains of only the following symptoms, and all other reviewed systems are negative.:  Fatigue, sleepiness , snoring, fragmented sleep, hypertension.    How likely are you to doze in the following situations: 0 = not likely, 1 = slight chance, 2 = moderate chance, 3 = high chance   Sitting and Reading? Watching Television? Sitting inactive in a public place (theater or meeting)? As a passenger in a car for an hour without a break? Lying down in the afternoon when circumstances permit? Sitting and talking to someone? Sitting quietly after lunch without alcohol? In a car, while stopped for a few minutes in traffic?   Total = 3/ 24 points . Wife would tend to 16 points.   FSS endorsed at 9/ 63 points.   Social History   Socioeconomic History  . Marital status: Married    Spouse name: Not on file  . Number of children: 2  . Years of education: Not on file  . Highest education level: Not on file  Occupational History  . Occupation: retired  Tobacco Use  . Smoking status: Former Smoker    Packs/day: 0.25    Years: 3.00    Pack years: 0.75    Quit date: 08/21/1966    Years since quitting: 53.3  . Smokeless tobacco: Never Used  Substance and Sexual Activity  . Alcohol use: Yes    Comment: limited  . Drug use: No  . Sexual activity: Yes  Other Topics Concern  . Not on file  Social History Narrative  . Not on file   Social Determinants of Health   Financial Resource Strain:   . Difficulty of Paying Living Expenses: Not on file  Food Insecurity:   . Worried About  Charity fundraiser in the Last Year: Not on file  . Ran Out of Food in the Last Year: Not on file  Transportation Needs:   . Lack of Transportation (Medical): Not on file  . Lack of Transportation (Non-Medical): Not on file  Physical Activity:   . Days of Exercise per Week: Not on file  . Minutes of Exercise per Session: Not on file  Stress:   . Feeling of Stress : Not on file  Social Connections:   .  Frequency of Communication with Friends and Family: Not on file  . Frequency of Social Gatherings with Friends and Family: Not on file  . Attends Religious Services: Not on file  . Active Member of Clubs or Organizations: Not on file  . Attends Archivist Meetings: Not on file  . Marital Status: Not on file    Family History  Problem Relation Age of Onset  . Heart disease Father   . Heart attack Father   . Alzheimer's disease Mother   . Heart failure Brother   . Colon cancer Neg Hx   . Stomach cancer Neg Hx   . Esophageal cancer Neg Hx   . Rectal cancer Neg Hx   . Liver cancer Neg Hx     Past Medical History:  Diagnosis Date  . Colon polyps   . Diverticulosis   . Hypercholesterolemia   . Hypertension   . Internal hemorrhoids     Past Surgical History:  Procedure Laterality Date  . EYE SURGERY     X 4  . HERNIA REPAIR  2006   abdominal  . SQUAMOUS CELL CARCINOMA EXCISION     preauricular on the right  . TONSILLECTOMY  1947  . VASECTOMY  1981     Current Outpatient Medications on File Prior to Visit  Medication Sig Dispense Refill  . aspirin 81 MG chewable tablet Chew by mouth daily.    Marland Kitchen atorvastatin (LIPITOR) 20 MG tablet Take 20 mg by mouth daily at 6 PM.     . citalopram (CELEXA) 10 MG tablet Take 10 mg by mouth daily.    Marland Kitchen donepezil (ARICEPT) 10 MG tablet Take 20 mg by mouth at bedtime.    . Multiple Vitamin (MULTIVITAMIN) tablet Take 1 tablet by mouth daily.    . niacinamide 500 MG tablet Take 500 mg by mouth 2 (two) times daily with a meal.       No current facility-administered medications on file prior to visit.    Allergies  Allergen Reactions  . Other Other (See Comments)    Vicryl sutures, pte. Doesn't know what reaction he had, "Drs. Owens Shark + Yeatts deetrmined not to use it on me"    Physical exam:  Today's Vitals   11/26/19 1106  BP: 113/71  Pulse: 64  Temp: (!) 97.3 F (36.3 C)  Weight: 180 lb (81.6 kg)  Height: 6' (1.829 m)   Body mass index is 24.41 kg/m.   Wt Readings from Last 3 Encounters:  11/26/19 180 lb (81.6 kg)  10/14/19 179 lb (81.2 kg)  09/19/19 182 lb (82.6 kg)     Ht Readings from Last 3 Encounters:  11/26/19 6' (1.829 m)  10/14/19 6' (1.829 m)  09/19/19 6' (1.829 m)      General: The patient is awake, alert and appears not in acute distress. The patient is well groomed. Head: Normocephalic, atraumatic. Neck is supple. Mallampati ,  neck circumference:15 inches . Nasal airflow  patent.   Dental status:  Cardiovascular:  Regular rate and cardiac rhythm by pulse,  without distended neck veins. Respiratory: Lungs are clear to auscultation.  Skin:  Without evidence of ankle edema, or rash. Trunk: The patient's posture is erect.   Neurologic exam : The patient is awake and alert, oriented to place and time.   Memory subjective described as impaired. Attention span & concentration ability appears normal.  Speech is fluent,  without  dysarthria, dysphonia or aphasia.  Mood and affect are appropriate.  Cranial nerves: no loss of smell or taste reported  Right eye - natural pupil- ptosis, after nasal septum repaired. . Funduscopic exam deferred   Extraocular movements in vertical and horizontal planes were intact and without nystagmus.  No Diplopia. Visual fields by finger perimetry are intact. Hearing was intact to soft voice and finger rubbing.    Facial sensation to fine touch is impaired in the left face- radiation and surgery.  Facial motor strength is asymmetric - the tongue   moved midline.  Uvula was deviated to the left, appears to stick to the pillar. .  Neck ROM : had neck resection -  rotation, tilt and flexion extension were normal for age and shoulder shrug was symmetrical.    Motor exam:  Symmetric bulk, tone and ROM.   Normal tone without cog wheeling, symmetric grip strength .   Sensory:  Fine touch, pinprick and vibration were tested  and  normal.  Proprioception tested in the upper extremities was normal.   Coordination: Rapid alternating movements in the fingers/hands were of normal speed.  The Finger-to-nose maneuver was intact without evidence of ataxia, dysmetria or tremor.   Gait and station: Patient could rise unassisted from a seated position, walked without assistive device.  Stance is of normal width/ base .  Toe and heel walk were deferred.  Deep tendon reflexes: in the upper and lower extremities are symmetric and intact.  Babinski response was deferred.       After spending a total time of  45 minutes face to face and additional time for physical and neurologic examination, review of laboratory studies,  personal review of imaging studies, reports and results of other testing and review of referral information / records as far as provided in visit, I have established the following assessments:  1) I will defer the memory evaluation to a latter point and concentrate on reviewing the described hypersomnia which has been verified by family observation especially by his spouse.  Mr. Thomsen would spend easily 10 to 12 hours a day asleep also he wakes up refreshed and restored in the morning it is difficult for him not to fall asleep again before lunchtime and he will take up to 2 naps more in the afternoon early and late. There are cardiological data available to me that I would like to verify in the nocturnal attended sleep study.  It is possible that he has quite significant bradycardia without the typical symptoms of lightheadedness.  It is  also possible that he has suffered due to the radiation additional brain lesion to the right brain which would usually result in a sleepier personality interestingly these changes can occur between 5 and 15 years after radiation and are not seen immediately.     My Plan is to proceed with:  Attended sleep study full EEG.   I would like to thank Crist Infante, MD and Crist Infante, [place],   [id number] for allowing me to meet with and to take care of this pleasant patient.   In short, ASADBEK BLOME is presenting with hypersomnia, in the setting of dementia, and head and neck radiation.  I plan to follow up either personally or through our NP within 2-3 month.    Electronically signed by: Larey Seat, MD 11/26/2019 11:24 AM  [facility] Board certified by The [employer] of Sleep Medicine and Diplomate of the [employer] of Sleep Medicine. Board certified In Neurology through the [employer],  Fellow of the [employer] of Neurology. Medical Director of [facility].

## 2019-11-26 NOTE — Patient Instructions (Signed)

## 2019-11-28 ENCOUNTER — Ambulatory Visit (INDEPENDENT_AMBULATORY_CARE_PROVIDER_SITE_OTHER): Payer: PPO | Admitting: Cardiology

## 2019-11-28 ENCOUNTER — Other Ambulatory Visit: Payer: Self-pay

## 2019-11-28 ENCOUNTER — Encounter: Payer: Self-pay | Admitting: Cardiology

## 2019-11-28 VITALS — BP 126/58 | HR 65 | Temp 97.6°F | Ht 72.0 in | Wt 181.6 lb

## 2019-11-28 DIAGNOSIS — R5383 Other fatigue: Secondary | ICD-10-CM | POA: Diagnosis not present

## 2019-11-28 DIAGNOSIS — R55 Syncope and collapse: Secondary | ICD-10-CM | POA: Diagnosis not present

## 2019-11-28 DIAGNOSIS — I471 Supraventricular tachycardia: Secondary | ICD-10-CM | POA: Diagnosis not present

## 2019-11-28 NOTE — Progress Notes (Signed)
Primary Physician:  Crist Infante, MD   Patient ID: Jeffrey [name], male    DOB: [birth year], 79 y.o.   MRN: [id number]  Subjective:    Chief Complaint  Patient presents with  . Near Syncope  . Hypertension  . Follow-up    event monitor    [name]  is a 79 y.o. male  with hypertension, kidney disease stage III,  hyperlipidemia, recently evaluated by [place] for exertional gait instability and near syncope that has been occurring over the last several years, but has recently had recurrent episode.  In view of abnormal EKG and his concerning symptoms, he underwent Lexiscan nuclear stress testing on 08/25/19 that showed moderate sized, mild intensity area of ischemia in the infero-apical wall, but still felt to be low risk study. Echocardiogram on 08/29/19 was essentially normal. Carotid duplex also performed at that time showed left external carotid >50% stenosis, otherwise, mild stenosis. No evidence of vertebral artery stenosis. Due to elevated blood pressure, advised to start back on Ramipril. He wore 2 week event monitor in November that showed some episodes of bradycardia; however, did not have any near syncopal episodes, so he was placed on 30 day event monitor for further evaluation. During the interim, he has been evaluated by Dr. Brett Fairy and is awaiting sleep study as possible etiology.   He did have one episode of gait instability that occurred while walking and required him to sit down. Continues to have fatigue and daytime sleepiness.  Patient does have some dementia, but is still able to care for himself and drive.  His wife and daughter are present at bedside. Establishing with Neurology in Feb. Question if radiation therapy on the right side of   He has had variations in blood pressure; however, overall stable. Daughter and wife present at bedside.   Past Medical History:  Diagnosis Date  . Colon polyps   . Diverticulosis   . Hypercholesterolemia   .  Hypertension   . Internal hemorrhoids     Past Surgical History:  Procedure Laterality Date  . EYE SURGERY     X 4  . HERNIA REPAIR  2006   abdominal  . SQUAMOUS CELL CARCINOMA EXCISION     preauricular on the right  . TONSILLECTOMY  1947  . VASECTOMY  1981    Social History   Socioeconomic History  . Marital status: Married    Spouse name: Not on file  . Number of children: 2  . Years of education: Not on file  . Highest education level: Not on file  Occupational History  . Occupation: retired  Tobacco Use  . Smoking status: Former Smoker    Packs/day: 0.25    Years: 3.00    Pack years: 0.75    Quit date: 08/21/1966    Years since quitting: 53.3  . Smokeless tobacco: Never Used  Substance and Sexual Activity  . Alcohol use: Yes    Comment: limited  . Drug use: No  . Sexual activity: Yes  Other Topics Concern  . Not on file  Social History Narrative  . Not on file   Social Determinants of Health   Financial Resource Strain:   . Difficulty of Paying Living Expenses: Not on file  Food Insecurity:   . Worried About Charity fundraiser in the Last Year: Not on file  . Ran Out of Food in the Last Year: Not on file  Transportation Needs:   . Lack of Transportation (Medical):  Not on file  . Lack of Transportation (Non-Medical): Not on file  Physical Activity:   . Days of Exercise per Week: Not on file  . Minutes of Exercise per Session: Not on file  Stress:   . Feeling of Stress : Not on file  Social Connections:   . Frequency of Communication with Friends and Family: Not on file  . Frequency of Social Gatherings with Friends and Family: Not on file  . Attends Religious Services: Not on file  . Active Member of Clubs or Organizations: Not on file  . Attends Archivist Meetings: Not on file  . Marital Status: Not on file  Intimate Partner Violence:   . Fear of Current or Ex-Partner: Not on file  . Emotionally Abused: Not on file  . Physically  Abused: Not on file  . Sexually Abused: Not on file    Review of Systems  Constitution: Negative for decreased appetite, malaise/fatigue, weight gain and weight loss.  Eyes: Negative for visual disturbance.  Cardiovascular: Negative for chest pain, claudication, dyspnea on exertion, leg swelling, orthopnea, palpitations and syncope.  Respiratory: Negative for hemoptysis and wheezing.   Endocrine: Negative for cold intolerance and heat intolerance.  Hematologic/Lymphatic: Does not bruise/bleed easily.  Skin: Negative for nail changes.  Musculoskeletal: Negative for muscle weakness and myalgias.  Gastrointestinal: Negative for abdominal pain, change in bowel habit, nausea and vomiting.  Neurological: Positive for loss of balance (episodic). Negative for difficulty with concentration, dizziness, focal weakness and headaches.  Psychiatric/Behavioral: Negative for altered mental status and suicidal ideas.  All other systems reviewed and are negative.     Objective:  Blood pressure (!) 126/58, pulse 65, temperature 97.6 F (36.4 C), height 6' (1.829 m), weight 181 lb 9.6 oz (82.4 kg), SpO2 95 %. Body mass index is 24.63 kg/m.    Physical Exam  Constitutional: He is oriented to person, place, and time. Vital signs are normal. He appears well-developed and well-nourished.  HENT:  Head: Normocephalic and atraumatic.  Cardiovascular: Normal rate, regular rhythm, normal heart sounds and intact distal pulses.  Pulses:      Carotid pulses are on the right side with bruit and on the left side with bruit.      Radial pulses are 2+ on the right side and 1+ on the left side.  Pulmonary/Chest: Effort normal and breath sounds normal. No accessory muscle usage. No respiratory distress.  Abdominal: Soft. Bowel sounds are normal.  Musculoskeletal:        General: Normal range of motion.     Cervical back: Normal range of motion.  Neurological: He is alert and oriented to person, place, and time.   Skin: Skin is warm and dry.  Vitals reviewed.  Radiology: No results found.  Laboratory examination:    CMP Latest Ref Rng & Units 11/10/2008 02/04/2008 08/21/2006  Glucose 70 - 99 mg/dL - 123(H) -  BUN 6 - 23 mg/dL 17 20 23   Creatinine 0.4 - 1.5 mg/dL 0.9 1.1 1.2  Potassium 3.5 - 5.1 meq/L 5.0 4.6 4.7  AST 0 - 37 units/L - 21 22  ALT 0 - 53 units/L - 29 22   No flowsheet data found. Lipid Panel     Component Value Date/Time   CHOL 125 02/04/2008 0746   TRIG 28 02/04/2008 0746   TRIG 19 08/21/2006 0807   HDL 42.4 02/04/2008 0746   CHOLHDL 2.9 CALC 02/04/2008 0746   VLDL 6 02/04/2008 0746   LDLCALC 77 02/04/2008 0746  HEMOGLOBIN A1C Lab Results  Component Value Date   HGBA1C 5.2 11/10/2008   TSH No results for input(s): TSH in the last 8760 hours.  PRN Meds:. There are no discontinued medications. Current Meds  Medication Sig  . aspirin 81 MG chewable tablet Chew by mouth daily.  Marland Kitchen atorvastatin (LIPITOR) 20 MG tablet Take 20 mg by mouth daily at 6 PM.   . citalopram (CELEXA) 10 MG tablet Take 10 mg by mouth daily.  . Multiple Vitamin (MULTIVITAMIN) tablet Take 1 tablet by mouth daily.  . niacinamide 500 MG tablet Take 500 mg by mouth 2 (two) times daily with a meal.  . ramipril (ALTACE) 2.5 MG capsule Take 2.5 mg by mouth daily.    Cardiac Studies:   Event monitor 10/21/2019 - 11/19/2019: Diagnostic time: 96%  Dominant rhythm: Sinus. HR 48-196 bpm. Avg HR 66 bpm. One episode of wide complex tachycardia at 196 bpm on 11/10/2019 at 6:34 PM CT, lasted for at least two min. Likely SVT with underlying incomplete LBBB, although VT cannot be excluded.  No symptoms reported with this event. Other episodes of occasional PAC's seen. No atrial fibrillation/atrial flutter/high grade AV block, sinus pause >3sec noted. Symptoms of fatigue correlate with sinus rhythm with and without PAC's.  14 day event monitor 11/6-11/20/20: Normal sinus rhythm. No critical events or  patient triggered events. 1 episode on Day 13 at 2242 is concerning for atrial flutter vs. atrial tachycardia at 140 bpm that persisted for greater than 5 minutes. Maximum HR 141 bpm on 11/18 at 2242. Minimum HR 48 bpm on 11/14 at 0513. Episodes of marked sinus bradycardia. No high degree AV block noted.  [place] Stress Test 08/25/2019: 1. Resting EKG normal sinus rhythm, left axis deviation, poor R progression, IVCD.  Stress EKG nondiagnostic due to Lexiscan infusion. Hypertensive both in rest and stress with resting  BP 160/100 mm Hg.  2. Perfusion imaging study reveals the left ventricle to be normal in both rest and stress images.  There is a moderate sized mild ischemia noted in inferior and infero-apical wall extending from the base towards the apex.  Left ventricular systolic function was preserved at 53% without wall motion abnormality. This represents a low risk study in view of very minimal ischemia and normal wall motion. No previous exam available for comparison.  Echocardiogram 08/29/2019:  Normal LV systolic function with visual EF 55-60%. Normal global wall motion. Left ventricle cavity is normal in size. Moderate concentric hypertrophy of the left ventricle. Doppler evidence of grade I (impaired) diastolic dysfunction, normal LAP. Trileaflet aortic valve. Mild (Grade I) aortic regurgitation. The aortic root is normal in size. There is a complex plaque noted at the tubular aorta.  See image.  Carotid artery duplex  08/29/2019: Stenosis in the right internal carotid artery (16-49%), lower end of spectrum. Stenosis in the right external carotid artery (<50%). Stenosis in the left internal carotid artery (16-49%), lower end of spectrum. Stenosis in the left external carotid artery (>50%). Antegrade right vertebral artery flow. Antegrade left vertebral artery flow. Follow up in one year is appropriate if clinically indicated.   Assessment:   Near syncope  Fatigue,  unspecified type  PSVT (paroxysmal supraventricular tachycardia) (HCC)  EKG 08/22/2019: Normal sinus rhythm at 65 bpm, normal axis, poor R wave progression cannot exclude anterior infarct old.  Incomplete left bundle branch block.    Recommendations:   Patient has been overall stable since last seen by [place]. I have reviewed and discussed event monitor  results. He was not noted to have any significant marked bradycardia episodes on event monitor, do not feel that pacemaker implantation is warranted at this time. He has had extensive cardiac evaluation, without etiology for his symptoms. Although he had one episode of PSVT on event monitor that lasted for approximately 10 minutes, this was episode was asymptomatic and feel that this is likely a incidental finding and does not explain his symptoms. As his episode was fairly brief and he was asymptomatic, do not feel that he needs medical management for this for now. I have recommended that he continue with sleep study evaluation and workup with neurology. Patient, wife, and daughter are all in agreeance with this. We will plan to see him back in 6 months for follow up on near syncope, but encouraged them to contact me sooner if needed.   *I have discussed this case with Dr. Einar Gip and he personally examined the patient and participated in formulating the plan.*   Miquel Dunn, MSN, APRN, FNP-C [facility]. [place] Office: [phone] Fax: [phone]

## 2019-12-07 ENCOUNTER — Ambulatory Visit: Payer: PPO

## 2019-12-08 DIAGNOSIS — H905 Unspecified sensorineural hearing loss: Secondary | ICD-10-CM | POA: Diagnosis not present

## 2019-12-08 DIAGNOSIS — F0280 Dementia in other diseases classified elsewhere without behavioral disturbance: Secondary | ICD-10-CM | POA: Diagnosis not present

## 2019-12-08 DIAGNOSIS — G301 Alzheimer's disease with late onset: Secondary | ICD-10-CM | POA: Diagnosis not present

## 2019-12-08 DIAGNOSIS — Z85828 Personal history of other malignant neoplasm of skin: Secondary | ICD-10-CM | POA: Diagnosis not present

## 2019-12-08 DIAGNOSIS — C44222 Squamous cell carcinoma of skin of right ear and external auricular canal: Secondary | ICD-10-CM | POA: Diagnosis not present

## 2019-12-08 DIAGNOSIS — Z08 Encounter for follow-up examination after completed treatment for malignant neoplasm: Secondary | ICD-10-CM | POA: Diagnosis not present

## 2019-12-08 DIAGNOSIS — R1319 Other dysphagia: Secondary | ICD-10-CM | POA: Diagnosis not present

## 2019-12-08 DIAGNOSIS — F0390 Unspecified dementia without behavioral disturbance: Secondary | ICD-10-CM | POA: Diagnosis not present

## 2019-12-19 ENCOUNTER — Ambulatory Visit: Payer: PPO | Admitting: Neurology

## 2019-12-19 ENCOUNTER — Other Ambulatory Visit: Payer: Self-pay

## 2019-12-19 ENCOUNTER — Encounter: Payer: Self-pay | Admitting: Neurology

## 2019-12-19 VITALS — BP 109/73 | Temp 97.3°F | Ht 72.0 in | Wt 183.5 lb

## 2019-12-19 DIAGNOSIS — G308 Other Alzheimer's disease: Secondary | ICD-10-CM | POA: Diagnosis not present

## 2019-12-19 DIAGNOSIS — F0280 Dementia in other diseases classified elsewhere without behavioral disturbance: Secondary | ICD-10-CM | POA: Diagnosis not present

## 2019-12-19 NOTE — Progress Notes (Signed)
SLEEP MEDICINE CLINIC    Provider:  Larey Seat, MD  Primary Care Physician:  Crist Infante, [place] [address]     Referring Provider: Dr. Adrian Prows, MD         Chief Complaint according to patient   Patient presents with:    . New Patient (Initial Visit)     pt wife states that he has excessive daytime sleepiness and takes 3 naps a day including sleeping 9:30p-7:30 am. he also has some cardiac irregularities that the cardiologist would like to rule out OSA.      HISTORY OF PRESENT ILLNESS:  Jeffrey [name] is a 79 y.o. year old Caucasian male patient, following up today on 12-19-2019.  I will perform a MOCA The problem of recurrent insomnia is discussed.I reviewed his 2 cardiac monitor test results from Dr Milderd Meager office.  One episode of bradycardia, one of tachycardia- both reportedly asymptomatic.  He is awaiting sleep study with full EEG on 12-29-2019.   We will do MOCA today- and discuss interval history- Jeffrey [name] is denying any trouble. His wife gives additional information. He fell at night over the toilet, he could not walk and stand steady the following day - he bumped into the left - and was " a little spacey" .  His BP was all over the place. And this morning he had 94/ 68 mmHg on the left, and the right sided BP was also low at  109/ 73 mmHg. He is at high fall risk, and he has cognitive impairment, too. MMSE was 28/ 30, but is not detecting differential difficulties, such as visio-spatial problems.  MOCA revealed some word delay, he missed all 5 recall words and he was not able to abstract as well-.   MOCA was 24/ 30 points. He has a delayed reaction time- he shall no longer drive.       Seen here upon referral by cardiology on  12/19/2019.  We will get the first and be correct.  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   Which included scapular skin flap closure.  Removal of the parotid gland the TMJ joint the lymph nodes C-section branches of the trigeminal nerve were involved and damaged surgery was followed by radiation treatments he was followed by Dr. Caryl Never at [facility].  In 2019 he had knee pain and some balance issues he did physical therapy and received 2 steroid shots to the knee.  In 2018 he had an obstructed tear ducts that needed to be repaired deviated septum and a lift of the area under the outer right eye.  This was again done by Dr. Janeal Holmes.  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  In 2020 July 21 he had a fall at [facility] hit his head had a facial laceration and felt dizzy and weak could not walk followed by an ER visit at [facility] CT negative there was no contusion laceration was treated.  In October again feeling of dizziness and weakness while walking in the morning sat down did not fall saw the nurse practitioner at Dr. Tempie Donning office who started a referral to cardiology.  He has a history of hypertension, has a history of mild dementia early stages, cardiology diagnosed him with carotid artery  stenosis but also kidneys chronic kidney disease stage II, hyperlipidemia, you recently had these 2 with syncope or near syncopal episodes so he underwent a Lexiscan nuclear stress test which showed only a small area of ischemia in the inner apical wall but it was still felt to be a low risk study.  Echocardiogram was essentially normal carotid duplex showed left external carotid over 50% stenosis otherwise mild this would not be affecting his brain function.  He was placed on a 2-week cardiac monitor there were no arrhythmias but he did have bradycardia and supraventricular tachycardia.  Blood pressures have reportedly been stable.   Chief concern according to patient :  " I am so sleepy"     I have the pleasure of seeing Jeffrey [name] today, a right-handed White or Caucasian male with a possible sleep disorder.      Family medical /sleep history: no other family member on CPAP with OSA, insomnia, sleep walking.    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 Lives in a household with 2 persons at [employer]. Daughter is a PA with [employer].  Pets are not present. Tobacco use: quit 54 years ago- in the [employer]. [place] V[place].   ETOH use none , Caffeine intake in form of Coffee( 2 cups in AM) Soda( none) Tea ( caffeinated at dinner ) or energy drinks.Regular exercise in form of - walking   Hobbies : none       Sleep habits are as follows: The patient's dinner time is between 5-6.30 PM. The patient goes to bed at 10 PM and continues to sleep for 7-8 hours, wakes for 3-4  bathroom breaks, the first time at 1 AM.   The preferred sleep position is supine and side, with the support of 2 pillows.  Dreams are reportedly rare. He belches a lot o at night.    7.30  AM is the usual rise time. The patient wakes up spontaneously/.  He reports  feeling refreshed or restored in AM, but he naps quickly- 9.30-10 and needs to be woken-  Lunch is about 12.30 and after lunch he naps again- 45 minutes, and I later afternoon again.  Total daily sleep time over 12 hours.  They moved to [facility] 08-2018 when his memory became impaired..    Review of Systems: Out of a complete 14 system review, the patient complains of only the following symptoms, and all other reviewed systems are negative.:  Fatigue, sleepiness , snoring, fragmented sleep, hypertension.    How likely are you to doze in the following situations: 0 = not likely, 1 = slight chance, 2 = moderate chance, 3 = high chance   Sitting and Reading? Watching Television? Sitting inactive in a public place (theater or meeting)? As a passenger in a car for an hour without a break? Lying down  in the afternoon when circumstances permit? Sitting and talking to someone? Sitting quietly after lunch without alcohol? In a car, while stopped for a few minutes in traffic?   Total = 3/ 24 points . Wife would tend to 16 points.   FSS endorsed at 9/ 63 points.   Social History   Socioeconomic History  . Marital status: Married    Spouse name: Not on file  . Number of children: 2  . Years of education: Not on file  . Highest education level: Not on file  Occupational History  .  Occupation: retired  Tobacco Use  . Smoking status: Former Smoker    Packs/day: 0.25    Years: 3.00    Pack years: 0.75    Quit date: 08/21/1966    Years since quitting: 53.3  . Smokeless tobacco: Never Used  Substance and Sexual Activity  . Alcohol use: Yes    Comment: limited  . Drug use: No  . Sexual activity: Yes  Other Topics Concern  . Not on file  Social History Narrative  . Not on file   Social Determinants of Health   Financial Resource Strain:   . Difficulty of Paying Living Expenses: Not on file  Food Insecurity:   . Worried About Charity fundraiser in the Last Year: Not on file  . Ran Out of Food in the Last Year: Not on file  Transportation Needs:   . Lack of Transportation (Medical): Not on file  . Lack of Transportation (Non-Medical): Not on file  Physical Activity:   . Days of Exercise per Week: Not on file  . Minutes of Exercise per Session: Not on file  Stress:   . Feeling of Stress : Not on file  Social Connections:   . Frequency of Communication with Friends and Family: Not on file  . Frequency of Social Gatherings with Friends and Family: Not on file  . Attends Religious Services: Not on file  . Active Member of Clubs or Organizations: Not on file  . Attends Archivist Meetings: Not on file  . Marital Status: Not on file    Family History  Problem Relation Age of Onset  . Heart disease Father   . Heart attack Father   . Alzheimer's disease Mother    . Heart failure Brother   . Colon cancer Neg Hx   . Stomach cancer Neg Hx   . Esophageal cancer Neg Hx   . Rectal cancer Neg Hx   . Liver cancer Neg Hx     Past Medical History:  Diagnosis Date  . Colon polyps   . Diverticulosis   . Hypercholesterolemia   . Hypertension   . Internal hemorrhoids     Past Surgical History:  Procedure Laterality Date  . EYE SURGERY     X 4  . HERNIA REPAIR  2006   abdominal  . SQUAMOUS CELL CARCINOMA EXCISION     preauricular on the right  . TONSILLECTOMY  1947  . VASECTOMY  1981     Current Outpatient Medications on File Prior to Visit  Medication Sig Dispense Refill  . ASPIRIN 81 PO Take by mouth.    Marland Kitchen atorvastatin (LIPITOR) 20 MG tablet Take 20 mg by mouth daily at 6 PM.     . citalopram (CELEXA) 10 MG tablet Take 10 mg by mouth daily.    Marland Kitchen donepezil (ARICEPT) 10 MG tablet Take 20 mg by mouth at bedtime.    . Multiple Vitamin (MULTIVITAMIN) tablet Take 1 tablet by mouth daily.    . niacinamide 500 MG tablet Take 500 mg by mouth 2 (two) times daily with a meal.    . ramipril (ALTACE) 2.5 MG capsule Take 2.5 mg by mouth daily.     No current facility-administered medications on file prior to visit.    Allergies  Allergen Reactions  . Other Other (See Comments)    Vicryl sutures, pte. Doesn't know what reaction he had, "Drs. Owens Shark + Yeatts deetrmined not to use it on me"    Physical exam:  Today's Vitals   12/19/19 1112  BP: 109/73  Temp: (!) 97.3 F (36.3 C)  Weight: 183 lb 8 oz (83.2 kg)  Height: 6' (1.829 m)   Body mass index is 24.89 kg/m.   Wt Readings from Last 3 Encounters:  12/19/19 183 lb 8 oz (83.2 kg)  11/28/19 181 lb 9.6 oz (82.4 kg)  11/26/19 180 lb (81.6 kg)     Ht Readings from Last 3 Encounters:  12/19/19 6' (1.829 m)  11/28/19 6' (1.829 m)  11/26/19 6' (1.829 m)      General: The patient is awake, alert and appears not in acute distress. The patient is well groomed. Head: Normocephalic,  atraumatic. Neck is supple. Mallampati ,  neck circumference:15 inches . Nasal airflow  patent.   Dental status:  Cardiovascular:  Regular rate and cardiac rhythm by pulse,  without distended neck veins. Respiratory: Lungs are clear to auscultation.  Skin:  Without evidence of ankle edema, or rash. Trunk: The patient's posture is erect.   Neurologic exam : The patient is awake and alert, oriented to place and time.   Memory subjective described as impaired. Attention span & concentration ability appears normal. He speaks very louldly and slowly- measured.  Speech is fluent,  without dysarthria, dysphonia or aphasia.  He has hearing aids.   Mood and affect are appropriate.   Cranial nerves: no loss of smell or taste reported  Right eye - natural pupil- ptosis, after nasal septum repaired. Funduscopic exam deferred   Extraocular movements in vertical and horizontal planes were intact and without nystagmus.  No Diplopia. Visual fields by finger perimetry are intact. Hearing was intact to soft voice and finger rubbing.    Facial sensation to fine touch is impaired in the left face- radiation and surgery.  Facial motor strength is asymmetric - the tongue  moved midline.  Uvula was deviated to the left, appears to stick to the pillar. .  Neck ROM : had neck resection -  rotation, tilt and flexion extension were normal for age and shoulder shrug was symmetrical.    Motor exam:  Symmetric bulk, tone and ROM.   Normal tone without cog wheeling, symmetric grip strength .   Sensory:  Fine touch, pinprick and vibration were tested  and  normal.  Proprioception tested in the upper extremities was normal.   Coordination: Rapid alternating movements in the fingers/hands were of normal speed.  The Finger-to-nose maneuver was intact without evidence of ataxia, dysmetria or tremor.   Gait and station: Patient could rise unassisted from a seated position, walked without assistive device.  Stance is  of normal width/ base .  Toe and heel walk were deferred.  Deep tendon reflexes: in the upper and lower extremities are symmetric and intact.  Babinski response was deferred.       After spending a total time of 30 minutes face to face and additional time for physical and neurologic examination, review of laboratory studies,  personal review of imaging studies, reports and results of other testing and review of referral information / records as far as provided in visit, I have established the following assessments:  1) early dementia, just borderline to MCI- MOCA was 24/ 30. Stay on Aricept. May need Namenda added in the future.  He has  Understood that driving is not encouraged, I will ask him to refrain for the next 3 month until I know more.  No flowsheet data found.     My Plan is to  proceed with:  Attended sleep study full EEG.  MOCA at revisit. 3 month   I would like to thank Crist Infante, MD and Crist Infante, [place],  [place] [address] for allowing me to meet with and to take care of this pleasant patient.   In short, Jeffrey [name] is presenting with hypersomnia, in the setting of dementia, and head and neck radiation.  I plan to follow up either personally or through our NP within 2-3 month.    Electronically signed by: Larey Seat, MD 12/19/2019 11:29 AM  [facility] Board certified by The [employer] of Sleep Medicine and Diplomate of the [employer] of Sleep Medicine. Board certified In Neurology through the [place], Fellow of the [employer] of Neurology. Medical Director of [facility].

## 2019-12-19 NOTE — Patient Instructions (Signed)

## 2019-12-23 DIAGNOSIS — C44229 Squamous cell carcinoma of skin of left ear and external auricular canal: Secondary | ICD-10-CM | POA: Diagnosis not present

## 2019-12-23 DIAGNOSIS — L814 Other melanin hyperpigmentation: Secondary | ICD-10-CM | POA: Diagnosis not present

## 2019-12-23 DIAGNOSIS — D485 Neoplasm of uncertain behavior of skin: Secondary | ICD-10-CM | POA: Diagnosis not present

## 2019-12-23 DIAGNOSIS — L821 Other seborrheic keratosis: Secondary | ICD-10-CM | POA: Diagnosis not present

## 2019-12-23 DIAGNOSIS — L570 Actinic keratosis: Secondary | ICD-10-CM | POA: Diagnosis not present

## 2019-12-23 DIAGNOSIS — Z85828 Personal history of other malignant neoplasm of skin: Secondary | ICD-10-CM | POA: Diagnosis not present

## 2019-12-25 DIAGNOSIS — R296 Repeated falls: Secondary | ICD-10-CM | POA: Diagnosis not present

## 2019-12-25 DIAGNOSIS — G301 Alzheimer's disease with late onset: Secondary | ICD-10-CM | POA: Diagnosis not present

## 2019-12-25 DIAGNOSIS — F0280 Dementia in other diseases classified elsewhere without behavioral disturbance: Secondary | ICD-10-CM | POA: Diagnosis not present

## 2019-12-29 ENCOUNTER — Ambulatory Visit (INDEPENDENT_AMBULATORY_CARE_PROVIDER_SITE_OTHER): Payer: PPO | Admitting: Neurology

## 2019-12-29 DIAGNOSIS — G4710 Hypersomnia, unspecified: Secondary | ICD-10-CM

## 2019-12-29 DIAGNOSIS — G3184 Mild cognitive impairment, so stated: Secondary | ICD-10-CM

## 2019-12-29 DIAGNOSIS — R001 Bradycardia, unspecified: Secondary | ICD-10-CM

## 2019-12-29 DIAGNOSIS — R55 Syncope and collapse: Secondary | ICD-10-CM

## 2019-12-29 DIAGNOSIS — C760 Malignant neoplasm of head, face and neck: Secondary | ICD-10-CM

## 2019-12-29 DIAGNOSIS — G4714 Hypersomnia due to medical condition: Secondary | ICD-10-CM

## 2020-01-06 ENCOUNTER — Encounter: Payer: Self-pay | Admitting: Neurology

## 2020-01-07 DIAGNOSIS — R1313 Dysphagia, pharyngeal phase: Secondary | ICD-10-CM | POA: Diagnosis not present

## 2020-01-07 DIAGNOSIS — R1319 Other dysphagia: Secondary | ICD-10-CM | POA: Diagnosis not present

## 2020-01-08 ENCOUNTER — Telehealth: Payer: Self-pay | Admitting: Neurology

## 2020-01-08 NOTE — Procedures (Signed)
PATIENT'S NAME:  Jeffrey [name], Jeffrey [name] DOB:      [birth year]      MR#:    [id number]     DATE OF RECORDING: 12/29/2019 REFERRING M.D.:  Crist Infante MD and Dr Einar Gip, wiht Lovette Cliche, NP  Study Performed:   Full EEG Polysomnogram HISTORY:  This 79 year old Caucasian male patient is following up on 12-19-2019. The problem of recurrent insomnia is discussed . I reviewed his 2 cardiac monitor test results from Dr Irven Shelling office.  One episode of bradycardia, one of tachycardia- both reportedly asymptomatic.  He is awaiting sleep study with full EEG on 12-29-2019.    [name] is denying any trouble. His wife gives additional information. He fell at night over the toilet, he could not walk and stand steady the following day - he bumped into the left - and was " a little spacey".  His BP was all over the place. And this morning he had 94/ 68 mmHg on the left, and the right sided BP was also low at 109/ 73 mmHg. He is at high fall risk, and he has cognitive impairment, too. MMSE was 28/ 30, but is not detecting differential difficulties, such as visio-spatial problems.  MOCA revealed some word delay, he missed all 5 recall words and he was not able to abstract as well-.   24/ 30 points. He has a delayed reaction time- he shall no longer drive. (Patient drove to and from sleep study.)   The patient endorsed the Epworth Sleepiness Scale at 3/24 points yet says : "I am so sleepy" .   The patient's weight 181 pounds with a height of 72 (inches), resulting in a BMI of 24.5 kg/m2. The patient's neck circumference measured 15 inches.  CURRENT MEDICATIONS: Lipitor, Aspirin, Celexa, Aricept   PROCEDURE:  This is a multichannel digital polysomnogram utilizing the Somnostar 11.2 system.  Electrodes and sensors were applied and monitored per AASM Specifications.   EEG, EOG, Chin and Limb EMG, were sampled at 200 Hz.  ECG, Snore and Nasal Pressure, Thermal Airflow, Respiratory Effort, CPAP Flow and Pressure, Oximetry was  sampled at 50 Hz. Digital video and audio were recorded.      BASELINE STUDY: Lights Out was at 21:06 and Lights On at 05:23.  Total recording time (TRT) was 497.5 minutes, with a total sleep time (TST) of 117.5 minutes.   The patient's sleep latency was 238 minutes.  REM latency was 219 minutes.   The sleep efficiency was 23.6 %    SLEEP ARCHITECTURE: WASO (Wake after sleep onset) was 140.5 minutes.  There were 16.5 minutes in Stage N1, 18.5 minutes Stage N2, 51.5 minutes Stage N3 and 31 minutes in Stage REM.  The percentage of Stage N1 was 14.%, Stage N2 was 15.7%, Stage N3 was 43.8% and Stage R (REM sleep) was 26.4%.   RESPIRATORY ANALYSIS:  There were a total of 15 respiratory events:  0 obstructive apneas, 2 central apneas and 4 mixed apneas with a total of 6 apneas and an apnea index (AI) of 3.1 /hour. There were 9 hypopneas with a hypopnea index of 4.6 /hour. The patient also had 0 respiratory event related arousals (RERAs).      The total APNEA/HYPOPNEA INDEX (AHI) was 7.7/hour and the total RESPIRATORY DISTURBANCE INDEX was  7.7 /hour.  5 events occurred in REM sleep and 14 events in NREM. The REM AHI was  9.7 /hour, versus a non-REM AHI of 6.9. The patient spent 0 minutes of  total sleep time in the supine position and 118 minutes in non-supine.. The supine AHI was 0.0 versus a non-supine AHI of 7.7.  OXYGEN SATURATION & C02:  The Wake baseline 02 saturation was 93%, with the lowest being 89%. Time spent below 89% saturation equaled 0 minutes.  The arousals were noted as: 17 were spontaneous, 6 were associated with PLMs, 5 were associated with respiratory events. The patient had a total of 23 Periodic Limb Movements.  The Periodic Limb Movement (PLM) index was 11.7 and the PLM Arousal index was 3.1/hour.   Audio and video analysis did not show any abnormal or unusual movements, behaviors, phonations or vocalizations.   EKG was abnormal, bradycardia and bigemini were noted. Screen shot  attached.    IMPRESSION: Severe Insomnia- the sleep efficiency was so low, that we were not able to capture all sleep stages. Thee was late onset REM sleep. The predominance of slow wave sleep EEG indicates an encephalopathic change, either through dementia or due to medication.   1. Very mild Obstructive Sleep Apnea (OSA)- this study was not converted to CPAP use because of the Covid restrictions , requiring previous COVID testing.  2. Abnormal EKG.   RECOMMENDATIONS:    I certify that I have reviewed the entire raw data recording prior to the issuance of this report in accordance with the Standards of Accreditation of the American Academy of Sleep Medicine (AASM)    Larey Seat, MD Diplomat, American Board of Psychiatry and Neurology  Diplomat, American Board of Sleep Medicine Market researcher, [place] [facility] [employer]

## 2020-01-08 NOTE — Telephone Encounter (Signed)
Report is in [facility] and screenshots were added to technical report

## 2020-01-08 NOTE — Telephone Encounter (Signed)
-----   Message from Larey Seat, MD sent at 01/08/2020  9:23 AM EST ----- The Periodic Limb Movement (PLM) index was 11.7 and the PLM Arousal index was 3.1/hour.   Audio and video analysis did not show any abnormal or unusual movements, behaviors, phonations or vocalizations.   EKG was abnormal, bradycardia and bigemini were noted. Screen shot attached.    IMPRESSION: Severe Insomnia- the sleep efficiency was so low, that we were not able to capture all sleep stages. Thee was late onset REM sleep. The predominance of slow wave sleep EEG indicates an encephalopathic change, either through dementia or due to medication.   1. Very mild Obstructive Sleep Apnea (OSA)- this study would not have been converted to CPAP use because of the Covid restrictions, requiring previous COVID testing.  2. Abnormal EKG.  NO other sleep specific abnormalities noted. CPAP would likely worsen Insomnia.

## 2020-01-08 NOTE — Telephone Encounter (Signed)
Called the pt and spoke to wife to advise the sleep study findings. Advised the wife that it indicated severe insomnia. There was no much sleep in the night. There was some mild sleep apnea but she feels that treating this possibly would cause the insomnia to be worse. As I reviewed the study the wife had a lot of questions such as how low did heart rate drop and what area did the EEG show slowing. She asked to set up follow up to discuss these findings in more detail.   Also this night was completely different from what is described at home she states that the patient has hypersomnia at home. She states that he sleeps during the night, although he seems to be a light sleeper and can be easily awakened he is sleeping. Then he is napping frequently during the daytime. Advised that this study could have showed insomnia because he was out of his home element. Pt wife will discuss with Dr Brett Fairy at the [facility] scheduled.

## 2020-01-08 NOTE — Progress Notes (Signed)
The Periodic Limb Movement (PLM) index was 11.7 and the PLM Arousal index was 3.1/hour.   Audio and video analysis did not show any abnormal or unusual movements, behaviors, phonations or vocalizations.   EKG was abnormal, bradycardia and bigemini were noted. Screen shot attached.    IMPRESSION: Severe Insomnia- the sleep efficiency was so low, that we were not able to capture all sleep stages. Thee was late onset REM sleep. The predominance of slow wave sleep EEG indicates an encephalopathic change, either through dementia or due to medication.   1. Very mild Obstructive Sleep Apnea (OSA)- this study would not have been converted to CPAP use because of the Covid restrictions, requiring previous COVID testing.  2. Abnormal EKG.  NO other sleep specific abnormalities noted. CPAP would likely worsen Insomnia.

## 2020-01-13 ENCOUNTER — Encounter: Payer: Self-pay | Admitting: Neurology

## 2020-01-14 ENCOUNTER — Telehealth (INDEPENDENT_AMBULATORY_CARE_PROVIDER_SITE_OTHER): Payer: PPO | Admitting: Neurology

## 2020-01-14 ENCOUNTER — Other Ambulatory Visit: Payer: Self-pay | Admitting: Neurology

## 2020-01-14 ENCOUNTER — Encounter: Payer: Self-pay | Admitting: Neurology

## 2020-01-14 DIAGNOSIS — G4714 Hypersomnia due to medical condition: Secondary | ICD-10-CM | POA: Diagnosis not present

## 2020-01-14 DIAGNOSIS — R001 Bradycardia, unspecified: Secondary | ICD-10-CM | POA: Diagnosis not present

## 2020-01-14 DIAGNOSIS — G3184 Mild cognitive impairment, so stated: Secondary | ICD-10-CM | POA: Diagnosis not present

## 2020-01-14 DIAGNOSIS — F0280 Dementia in other diseases classified elsewhere without behavioral disturbance: Secondary | ICD-10-CM

## 2020-01-14 DIAGNOSIS — G308 Other Alzheimer's disease: Secondary | ICD-10-CM

## 2020-01-14 DIAGNOSIS — F5101 Primary insomnia: Secondary | ICD-10-CM | POA: Diagnosis not present

## 2020-01-14 DIAGNOSIS — R55 Syncope and collapse: Secondary | ICD-10-CM

## 2020-01-14 DIAGNOSIS — G4710 Hypersomnia, unspecified: Secondary | ICD-10-CM

## 2020-01-14 DIAGNOSIS — G4730 Sleep apnea, unspecified: Secondary | ICD-10-CM

## 2020-01-14 NOTE — Progress Notes (Signed)
Virtual Visit via Video Note  I connected with Jeffrey [name] on 01/14/20 at 11:00 AM EST by a video enabled telemedicine application and verified that I am speaking with the correct person using two identifiers.  Location: Patient: at residence Provider: at [facility]    I discussed the limitations of evaluation and management by telemedicine and the availability of in person appointments. The patient expressed understanding and agreed to proceed.  History of Present Illness:  Jeffrey [name] has been referred by Dr. Hardie Shackleton and cardiology Dr. Einar Gip with his nurse practitioner Lovette Cliche.  This 79 year old Caucasian gentleman had followed up in the office on 12/19/2019 he has insomnia compliance, he also has bradycardia tachycardia but seems to be asymptomatic except for orthostatic changes.  A sleep study with full EEG was ordered for later in the month.  The patient also had some memory deficits his Mini-Mental status was 28 out of 30 his MOCA was 24 out of 30.    Observations/Objective: Mr. and Jeffrey [name] are both participating in this video conference.  We discussed the outcome of his sleep study based on bradycardia on previous EKG recordings, history of head and neck cancer, history of recent hypersomnia, near syncope, and amnestic cognitive impairment.  The baseline study showed a sleep efficiency of only 23.6% and a total sleep time of less than 2 hours  26% was however still spent in REM sleep  The total AHI was 7.7 during REM sleep slightly accentuated to 9.7/h the diagnosis was that of severe insomnia sleep efficiency was very low that we were not able to capture all sleep stages.   There was very late onset of REM sleep.  An abnormal EKG was noted bradycardia and bigeminy were seen.  I attached a screenshot.   Assessment and Plan: Due to the very reduced overall sleep time I suggested we will repeat a home sleep test which will only address the frequency of apnea and the correlation  to bradycardia. Dr Leonarda Salon had started him on Aricept which can potentially worsen bradycardia.  Mrs. Mcewan has emailed records of HR and BP , and the 11th February showed a very good example of bradycardia in the 40s. The Aricept dose has now been reduced by 50%.   Follow Up Instructions: Jeffrey [name] will be notified when he can pick up a home sleep test ( watchpat) here at our office and will be kindly requested to return at the next business day.  Following that evaluation and interpretation and if an AHI of over 5 is confirmed I would probably want him to use CPAP therapy even for mild apnea, as it can have a beneficial effect on heart rate, blood pressure and hopefully also on memory.  I do not think that his primary insomnia is correlated to the cardiac function but hope that his sleep quality overall can improve and should benefit his cognitive health.    I discussed the assessment and treatment plan with the patient. The patient was provided an opportunity to ask questions and all were answered. The patient agreed with the plan and demonstrated an understanding of the instructions.   The patient was advised to call back or seek an in-person evaluation if the symptoms worsen or if the condition fails to improve as anticipated.  I provided 21 minutes of non-face-to-face time during this encounter.   Larey Seat, MD

## 2020-01-14 NOTE — Patient Instructions (Signed)
Management of Memory Problems  There are some general things you can do to help manage your memory problems.  Your memory may not in fact recover, but by using techniques and strategies you will be able to manage your memory difficulties better.  1)  Establish a routine.  Try to establish and then stick to a regular routine.  By doing this, you will get used to what to expect and you will reduce the need to rely on your memory.  Also, try to do things at the same time of day, such as taking your medication or checking your calendar first thing in the morning.  Think about think that you can do as a part of a regular routine and make a list.  Then enter them into a daily planner to remind you.  This will help you establish a routine.  2)  Organize your environment.  Organize your environment so that it is uncluttered.  Decrease visual stimulation.  Place everyday items such as keys or cell phone in the same place every day (ie.  Basket next to front door)  Use post it notes with a brief message to yourself (ie. Turn off light, lock the door)  Use labels to indicate where things go (ie. Which cupboards are for food, dishes, etc.)  Keep a notepad and pen by the telephone to take messages  3)  Memory Aids  A diary or journal/notebook/daily planner  Making a list (shopping list, chore list, to do list that needs to be done)  Using an alarm as a reminder (kitchen timer or cell phone alarm)  Using cell phone to store information (Notes, Calendar, Reminders)  Calendar/White board placed in a prominent position  Post-it notes  In order for memory aids to be useful, you need to have good habits.  It's no good remembering to make a note in your journal if you don't remember to look in it.  Try setting aside a certain time of day to look in journal.  4)  Improving mood and managing fatigue.  There may be other factors that contribute to memory difficulties.  Factors, such as anxiety,  depression and tiredness can affect memory.  Regular gentle exercise can help improve your mood and give you more energy.  Simple relaxation techniques may help relieve symptoms of anxiety  Try to get back to completing activities or hobbies you enjoyed doing in the past.  Learn to pace yourself through activities to decrease fatigue.  Find out about some local support groups where you can share experiences with others.  Try and achieve 7-8 hours of sleep at night.  Memory Compensation Strategies  2. Use "WARM" strategy.  W= write it down  A= associate it  R= repeat it  M= make a mental note  2.   You can keep a Memory Notebook.  Use a 3-ring notebook with sections for the following: calendar, important names and phone numbers,  medications, doctors' names/phone numbers, lists/reminders, and a section to journal what you did  each day.   3.    Use a calendar to write appointments down.  4.    Write yourself a schedule for the day.  This can be placed on the calendar or in a separate section of the Memory Notebook.  Keeping a  regular schedule can help memory.  5.    Use medication organizer with sections for each day or morning/evening pills.  You may need help loading it  6.      Keep a basket, or pegboard by the door.  Place items that you need to take out with you in the basket or on the pegboard.  You may also want to  include a message board for reminders.  7.    Use sticky notes.  Place sticky notes with reminders in a place where the task is performed.  For example: " turn off the  stove" placed by the stove, "lock the door" placed on the door at eye level, " take your medications" on  the bathroom mirror or by the place where you normally take your medications.  8.    Use alarms/timers.  Use while cooking to remind yourself to check on food or as a reminder to take your medicine, or as a  reminder to make a call, or as a reminder to perform another task, etc.  

## 2020-01-21 DIAGNOSIS — C44229 Squamous cell carcinoma of skin of left ear and external auricular canal: Secondary | ICD-10-CM | POA: Diagnosis not present

## 2020-01-22 DIAGNOSIS — R1319 Other dysphagia: Secondary | ICD-10-CM | POA: Diagnosis not present

## 2020-01-22 DIAGNOSIS — R1313 Dysphagia, pharyngeal phase: Secondary | ICD-10-CM | POA: Diagnosis not present

## 2020-01-23 DIAGNOSIS — R1319 Other dysphagia: Secondary | ICD-10-CM | POA: Diagnosis not present

## 2020-01-23 DIAGNOSIS — R1313 Dysphagia, pharyngeal phase: Secondary | ICD-10-CM | POA: Diagnosis not present

## 2020-01-26 DIAGNOSIS — R1319 Other dysphagia: Secondary | ICD-10-CM | POA: Diagnosis not present

## 2020-01-26 DIAGNOSIS — R1313 Dysphagia, pharyngeal phase: Secondary | ICD-10-CM | POA: Diagnosis not present

## 2020-01-27 DIAGNOSIS — R1319 Other dysphagia: Secondary | ICD-10-CM | POA: Diagnosis not present

## 2020-01-27 DIAGNOSIS — R1313 Dysphagia, pharyngeal phase: Secondary | ICD-10-CM | POA: Diagnosis not present

## 2020-01-29 DIAGNOSIS — R1313 Dysphagia, pharyngeal phase: Secondary | ICD-10-CM | POA: Diagnosis not present

## 2020-01-29 DIAGNOSIS — R1319 Other dysphagia: Secondary | ICD-10-CM | POA: Diagnosis not present

## 2020-01-30 DIAGNOSIS — R1313 Dysphagia, pharyngeal phase: Secondary | ICD-10-CM | POA: Diagnosis not present

## 2020-01-30 DIAGNOSIS — R1319 Other dysphagia: Secondary | ICD-10-CM | POA: Diagnosis not present

## 2020-02-02 DIAGNOSIS — R1319 Other dysphagia: Secondary | ICD-10-CM | POA: Diagnosis not present

## 2020-02-02 DIAGNOSIS — R1313 Dysphagia, pharyngeal phase: Secondary | ICD-10-CM | POA: Diagnosis not present

## 2020-02-03 DIAGNOSIS — M6389 Disorders of muscle in diseases classified elsewhere, multiple sites: Secondary | ICD-10-CM | POA: Diagnosis not present

## 2020-02-03 DIAGNOSIS — R2689 Other abnormalities of gait and mobility: Secondary | ICD-10-CM | POA: Diagnosis not present

## 2020-02-03 DIAGNOSIS — R296 Repeated falls: Secondary | ICD-10-CM | POA: Diagnosis not present

## 2020-02-03 DIAGNOSIS — R278 Other lack of coordination: Secondary | ICD-10-CM | POA: Diagnosis not present

## 2020-02-03 DIAGNOSIS — G301 Alzheimer's disease with late onset: Secondary | ICD-10-CM | POA: Diagnosis not present

## 2020-02-03 DIAGNOSIS — R2681 Unsteadiness on feet: Secondary | ICD-10-CM | POA: Diagnosis not present

## 2020-02-04 ENCOUNTER — Ambulatory Visit (INDEPENDENT_AMBULATORY_CARE_PROVIDER_SITE_OTHER): Payer: PPO | Admitting: Neurology

## 2020-02-04 ENCOUNTER — Other Ambulatory Visit: Payer: Self-pay

## 2020-02-04 DIAGNOSIS — R001 Bradycardia, unspecified: Secondary | ICD-10-CM

## 2020-02-04 DIAGNOSIS — G4710 Hypersomnia, unspecified: Secondary | ICD-10-CM

## 2020-02-04 DIAGNOSIS — F0280 Dementia in other diseases classified elsewhere without behavioral disturbance: Secondary | ICD-10-CM

## 2020-02-04 DIAGNOSIS — G3184 Mild cognitive impairment, so stated: Secondary | ICD-10-CM

## 2020-02-04 DIAGNOSIS — G308 Other Alzheimer's disease: Secondary | ICD-10-CM

## 2020-02-04 DIAGNOSIS — R1313 Dysphagia, pharyngeal phase: Secondary | ICD-10-CM | POA: Diagnosis not present

## 2020-02-04 DIAGNOSIS — R1319 Other dysphagia: Secondary | ICD-10-CM | POA: Diagnosis not present

## 2020-02-04 DIAGNOSIS — R55 Syncope and collapse: Secondary | ICD-10-CM

## 2020-02-05 DIAGNOSIS — R1313 Dysphagia, pharyngeal phase: Secondary | ICD-10-CM | POA: Diagnosis not present

## 2020-02-05 DIAGNOSIS — R1319 Other dysphagia: Secondary | ICD-10-CM | POA: Diagnosis not present

## 2020-02-09 DIAGNOSIS — R1313 Dysphagia, pharyngeal phase: Secondary | ICD-10-CM | POA: Diagnosis not present

## 2020-02-09 DIAGNOSIS — R1319 Other dysphagia: Secondary | ICD-10-CM | POA: Diagnosis not present

## 2020-02-10 DIAGNOSIS — R1319 Other dysphagia: Secondary | ICD-10-CM | POA: Diagnosis not present

## 2020-02-10 DIAGNOSIS — R1313 Dysphagia, pharyngeal phase: Secondary | ICD-10-CM | POA: Diagnosis not present

## 2020-02-11 DIAGNOSIS — R1319 Other dysphagia: Secondary | ICD-10-CM | POA: Diagnosis not present

## 2020-02-11 DIAGNOSIS — R1313 Dysphagia, pharyngeal phase: Secondary | ICD-10-CM | POA: Diagnosis not present

## 2020-02-12 DIAGNOSIS — G301 Alzheimer's disease with late onset: Secondary | ICD-10-CM | POA: Diagnosis not present

## 2020-02-12 DIAGNOSIS — R278 Other lack of coordination: Secondary | ICD-10-CM | POA: Diagnosis not present

## 2020-02-12 DIAGNOSIS — R2681 Unsteadiness on feet: Secondary | ICD-10-CM | POA: Diagnosis not present

## 2020-02-12 DIAGNOSIS — M6389 Disorders of muscle in diseases classified elsewhere, multiple sites: Secondary | ICD-10-CM | POA: Diagnosis not present

## 2020-02-12 DIAGNOSIS — R2689 Other abnormalities of gait and mobility: Secondary | ICD-10-CM | POA: Diagnosis not present

## 2020-02-12 DIAGNOSIS — R296 Repeated falls: Secondary | ICD-10-CM | POA: Diagnosis not present

## 2020-02-13 DIAGNOSIS — R1313 Dysphagia, pharyngeal phase: Secondary | ICD-10-CM | POA: Diagnosis not present

## 2020-02-13 DIAGNOSIS — R1319 Other dysphagia: Secondary | ICD-10-CM | POA: Diagnosis not present

## 2020-02-16 DIAGNOSIS — R1313 Dysphagia, pharyngeal phase: Secondary | ICD-10-CM | POA: Diagnosis not present

## 2020-02-16 DIAGNOSIS — R1319 Other dysphagia: Secondary | ICD-10-CM | POA: Diagnosis not present

## 2020-02-17 ENCOUNTER — Telehealth: Payer: Self-pay | Admitting: Neurology

## 2020-02-17 DIAGNOSIS — M6389 Disorders of muscle in diseases classified elsewhere, multiple sites: Secondary | ICD-10-CM | POA: Diagnosis not present

## 2020-02-17 DIAGNOSIS — G309 Alzheimer's disease, unspecified: Secondary | ICD-10-CM | POA: Insufficient documentation

## 2020-02-17 DIAGNOSIS — R1319 Other dysphagia: Secondary | ICD-10-CM | POA: Diagnosis not present

## 2020-02-17 DIAGNOSIS — R1313 Dysphagia, pharyngeal phase: Secondary | ICD-10-CM | POA: Diagnosis not present

## 2020-02-17 DIAGNOSIS — G301 Alzheimer's disease with late onset: Secondary | ICD-10-CM | POA: Diagnosis not present

## 2020-02-17 DIAGNOSIS — R001 Bradycardia, unspecified: Secondary | ICD-10-CM | POA: Insufficient documentation

## 2020-02-17 DIAGNOSIS — R2689 Other abnormalities of gait and mobility: Secondary | ICD-10-CM | POA: Diagnosis not present

## 2020-02-17 DIAGNOSIS — R55 Syncope and collapse: Secondary | ICD-10-CM | POA: Insufficient documentation

## 2020-02-17 DIAGNOSIS — G4710 Hypersomnia, unspecified: Secondary | ICD-10-CM | POA: Insufficient documentation

## 2020-02-17 DIAGNOSIS — R2681 Unsteadiness on feet: Secondary | ICD-10-CM | POA: Diagnosis not present

## 2020-02-17 DIAGNOSIS — R296 Repeated falls: Secondary | ICD-10-CM | POA: Diagnosis not present

## 2020-02-17 DIAGNOSIS — G3184 Mild cognitive impairment, so stated: Secondary | ICD-10-CM | POA: Insufficient documentation

## 2020-02-17 DIAGNOSIS — R278 Other lack of coordination: Secondary | ICD-10-CM | POA: Diagnosis not present

## 2020-02-17 DIAGNOSIS — G4730 Sleep apnea, unspecified: Secondary | ICD-10-CM | POA: Insufficient documentation

## 2020-02-17 DIAGNOSIS — F0280 Dementia in other diseases classified elsewhere without behavioral disturbance: Secondary | ICD-10-CM | POA: Insufficient documentation

## 2020-02-17 NOTE — Telephone Encounter (Signed)
During apt reminder call for Thursday, pt wife wanted a call back regarding getting set up for CPAP soon as they will be going out of town best call back [phone]

## 2020-02-17 NOTE — Telephone Encounter (Signed)
Called patient to discuss sleep study results. No answer at this time. LVM for the patient to call back.   

## 2020-02-17 NOTE — Procedures (Signed)
  Patient Information     First Name: Jeffrey Last Name: Daylin [name]: [id number]  Birth Date: 03/17/1941 Age: 79 Gender: Male  Referring Provider: Crist Infante, MD BMI: 24.8 (W=183 lb, H=6' 0'')  Neck Circ.:  15 '' Epworth:  3/24   Sleep Study Information    Study Date: Feb 04, 2020 S/H/A Version: 001.001.001.001 / 4.1.1528 / 18  History:    [name] has been referred by Dr. Crist Infante and cardiologist Dr. Einar Gip / nurse practitioner Lovette Cliche.  This 79 year old Caucasian gentleman had been seen 01-14-20 per video 2021. A sleep study in the lab was not successful due to insomnia. EEG was extremely slow (12-29-2019). The patient also had some memory deficits his Mini-Mental status was 28 out of 30 his MOCA was 24 out of 30. Dr Leonarda Salon had started the patient on Aricept. The patient has a history of head and neck cancer.       Summary & Diagnosis:    This HST revealed a severe OSA with an AHI of 32.6/h and RDI of 35.8/h, REM AHI was 39.4. overall sleep time was estimated at 7 hours.   Recommendations:      This degree of Sleep Apnea would require Positive Airway pressure Therapy, and we start with an autotitration CPAP device between 5--16 cm water pressure, 3 cm EPR and a mask of choice. Due to [name] history of head and neck cancer, the fitting has to be done in person.   Interpreting Physician: Jeb Levering, MD             Sleep Summary  Oxygen Saturation Statistics   Start Study Time: End Study Time: Total Recording Time:  9:23:14 PM 7:27:15 AM 10 h, 4 min  Total Sleep Time % REM of Sleep Time:  8 h, 8 min  29.6    Mean: 93 Minimum: 78 Maximum: 98  Mean of Desaturations Nadirs (%):   91  Oxygen Desaturation. %:   4-9 10-20 >20 Total  Events Number Total    83  3 96.5 3.5  0 0.0  86 100.0  Oxygen Saturation: <90 <=88 <85 <80 <70  Duration (minutes): Sleep % 0.8 0.2  0.1 0.1  0.0 0.0 0.0 0.0 0.0 0.0     Respiratory Indices      Total Events REM  NREM All Night  pRDI:  261  pAHI:  238 ODI:  86  pAHIc:  40  % CSR: 0.0 43.4 39.4 20.4 7.5 32.4 29.6 7.9 4.7 35.8 32.6 11.8 5.6       Pulse Rate Statistics during Sleep (BPM)      Mean: 58 Minimum: N/A Maximum: 88    Indices are calculated using technically valid sleep time of 7 h, 17 min. Central-Indices are calculated using technically valid sleep time of 7 h, 6 min. pRDI/pAHI are calculated using oxi desaturations ? 3%  Body Position Statistics  Position Supine Prone Right Left Non-Supine  Sleep (min) 124.5 3.5 302.5 58.0 364.0  Sleep % 25.5 0.7 61.9 11.9 74.5  pRDI 41.0 N/A 31.7 44.6 33.9  pAHI 36.9 N/A 29.0 41.4 31.1  ODI 21.3 N/A 6.4 18.5 8.4     Snoring Statistics Snoring Level (dB) >40 >50 >60 >70 >80 >Threshold (45)  Sleep (min) 314.1 5.9 1.2 0.0 0.0 13.9  Sleep % 64.3 1.2 0.2 0.0 0.0 2.8    Mean: 41 dB

## 2020-02-17 NOTE — Addendum Note (Signed)
Addended by: Larey Seat on: 02/17/2020 08:59 AM   Modules accepted: Orders

## 2020-02-17 NOTE — Telephone Encounter (Signed)
-----   Message from Larey Seat, MD sent at 02/17/2020  8:59 AM EDT ----- Summary & Diagnosis:   This HST revealed a severe OSA with an AHI of 32.6/h and RDI of  35.8/h, REM AHI was 39.4. overall sleep time was estimated at 7  hours.   Recommendations:     This degree of Sleep Apnea would require Positive Airway pressure  Therapy, and we start with an autotitration CPAP device between  5-16 cm water pressure, 3 cm EPR and a mask of choice. Due to [name] history of head and neck cancer, the fitting has to be  done in person.   Interpreting Physician: Jeb Levering, MD

## 2020-02-17 NOTE — Progress Notes (Signed)
Summary & Diagnosis:   This HST revealed a severe OSA with an AHI of 32.6/h and RDI of  35.8/h, REM AHI was 39.4. overall sleep time was estimated at 7  hours.   Recommendations:     This degree of Sleep Apnea would require Positive Airway pressure  Therapy, and we start with an autotitration CPAP device between  5-16 cm water pressure, 3 cm EPR and a mask of choice. Due to [name] history of head and neck cancer, the fitting has to be  done in person.   Interpreting Physician: Jeb Levering, MD

## 2020-02-18 DIAGNOSIS — R1319 Other dysphagia: Secondary | ICD-10-CM | POA: Diagnosis not present

## 2020-02-18 DIAGNOSIS — R1313 Dysphagia, pharyngeal phase: Secondary | ICD-10-CM | POA: Diagnosis not present

## 2020-02-18 NOTE — Telephone Encounter (Signed)
I called pt and wife. I advised pt that Dr. Brett Fairy reviewed their sleep study results and found that pt severe sleep apnea. Dr. Brett Fairy recommends that pt starts auto CPAP. I reviewed PAP compliance expectations with the pt. Pt is agreeable to starting a CPAP. I advised pt that an order will be sent to a DME, [facility], and [facility] will call the pt within about one week after they file with the pt's insurance. [facility] will show the pt how to use the machine, fit for masks, and troubleshoot the CPAP if needed. A follow up appt was made for insurance purposes with Dr. Brett Fairy on July 7,2021 at 1:30 pm. Pt verbalized understanding to arrive 15 minutes early and bring their CPAP. A letter with all of this information in it will be mailed to the pt as a reminder. I verified with the pt that the address we have on file is correct. Pt verbalized understanding of results. Pt had no questions at this time but was encouraged to call back if questions arise. I have sent the order to [facility] and have received confirmation that they have received the order.

## 2020-02-19 ENCOUNTER — Other Ambulatory Visit: Payer: Self-pay

## 2020-02-19 ENCOUNTER — Encounter: Payer: Self-pay | Admitting: Neurology

## 2020-02-19 ENCOUNTER — Ambulatory Visit: Payer: PPO | Admitting: Neurology

## 2020-02-19 VITALS — BP 132/88 | HR 61 | Temp 97.2°F | Ht 72.0 in | Wt 181.0 lb

## 2020-02-19 DIAGNOSIS — F0280 Dementia in other diseases classified elsewhere without behavioral disturbance: Secondary | ICD-10-CM

## 2020-02-19 DIAGNOSIS — G308 Other Alzheimer's disease: Secondary | ICD-10-CM | POA: Diagnosis not present

## 2020-02-19 DIAGNOSIS — G4730 Sleep apnea, unspecified: Secondary | ICD-10-CM | POA: Diagnosis not present

## 2020-02-19 DIAGNOSIS — R001 Bradycardia, unspecified: Secondary | ICD-10-CM

## 2020-02-19 DIAGNOSIS — G4710 Hypersomnia, unspecified: Secondary | ICD-10-CM

## 2020-02-19 NOTE — Patient Instructions (Signed)

## 2020-02-19 NOTE — Progress Notes (Signed)
SLEEP MEDICINE CLINIC    Provider:  Larey Seat, MD  Primary Care Physician:  Crist Infante, [place] [address]     Referring Provider: Dr. Adrian Prows, MD         Chief Complaint according to patient   Patient presents with:    . New Patient (Initial Visit)     pt wife states that he has excessive daytime sleepiness and takes 3 naps a day including sleeping 9:30p-7:30 am. he also has some cardiac irregularities that the cardiologist would like to rule out OSA.      HISTORY OF PRESENT ILLNESS:  Jeffrey [name] is a 79 y.o. year old Caucasian male patient, following up today on 02/19/2020.  His PSG did not give any answers as he couldn't sleep. He underwent HST and has been given a diagnosis pf sevre OSA, with bradycardia and AHI of 32/h. CPAP is needed . We discussed in detail the potential benefits of OSA treatment for bradycardia and hopefully benefits for his underlying dementia. Due to his facial structures I requested and evaluation face- to -face.     12-19-2019.  I will perform a MOCA The problem of recurrent insomnia is discussed.I reviewed his 2 cardiac monitor test results from Dr Milderd Meager office.  One episode of bradycardia, one of tachycardia- both reportedly asymptomatic.  He is awaiting sleep study with full EEG on 12-29-2019.   We will do MOCA today- and discuss interval history- Jeffrey [name] is denying any trouble. His wife gives additional information. He fell at night over the toilet, he could not walk and stand steady the following day - he bumped into the left - and was " a little spacey" .  His BP was all over the place. And this morning he had 94/ 68 mmHg on the left, and the right sided BP was also low at  109/ 73 mmHg. He is at high fall risk, and he has cognitive impairment, too. MMSE was 28/ 30, but is not detecting differential difficulties, such as visio-spatial problems.  MOCA revealed some word delay, he missed all 5 recall words and  he was not able to abstract as well-.   MOCA was 24/ 30 points. He has a delayed reaction time- he shall no longer drive.    Seen here upon referral by cardiology on  02/19/2020.  We will get the first and be correct.  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  Which included scapular skin flap closure.  Removal of the parotid gland the TMJ joint the lymph nodes C-section branches of the trigeminal nerve were involved and damaged surgery was followed by radiation treatments he was followed by Dr. Caryl Never at [facility].  In 2019 he had knee pain and some balance issues he did physical therapy and received 2 steroid shots to the knee.  In 2018 he had an obstructed tear ducts that needed to be repaired deviated septum and a lift of the area under the outer right eye.  This was again done by Dr. Janeal Holmes.  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  In 2020 July 21 he had a fall at [facility] hit his head had a facial laceration and felt dizzy and weak could not walk followed by an ER  visit at [facility] CT negative there was no contusion laceration was treated.  In October again feeling of dizziness and weakness while walking in the morning sat down did not fall saw the nurse practitioner at Dr. Tempie Donning office who started a referral to cardiology.  He has a history of hypertension, has a history of mild dementia early stages, cardiology diagnosed him with carotid artery stenosis but also kidneys chronic kidney disease stage II, hyperlipidemia, you recently had these 2 with syncope or near syncopal episodes so he underwent a Lexiscan nuclear stress test which showed only a small area of ischemia in the inner apical wall but it was still felt to be a low risk study.  Echocardiogram was essentially normal carotid duplex showed  left external carotid over 50% stenosis otherwise mild this would not be affecting his brain function.  He was placed on a 2-week cardiac monitor there were no arrhythmias but he did have bradycardia and supraventricular tachycardia.  Blood pressures have reportedly been stable.   Chief concern according to patient :  " I am so sleepy"     I have the pleasure of seeing Jeffrey [name] today, a right-handed White or Caucasian male with a possible sleep disorder.      Family medical /sleep history: no other family member on CPAP with OSA, insomnia, sleep walking.    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 Lives in a household with 2 persons at [employer]. Daughter is a PA with [employer].  Pets are not present. Tobacco use: quit 54 years ago- in the [employer] [place] V[place].   ETOH use none , Caffeine intake in form of Coffee( 2 cups in AM) Soda( none) Tea ( caffeinated at dinner ) or energy drinks.Regular exercise in form of - walking   Hobbies : none       Sleep habits are as follows: The patient's dinner time is between 5-6.30 PM. The patient goes to bed at 10 PM and continues to sleep for 7-8 hours, wakes for 3-4  bathroom breaks, the first time at 1 AM.   The preferred sleep position is supine and side, with the support of 2 pillows.  Dreams are reportedly rare. He belches a lot o at night.    7.30  AM is the usual rise time. The patient wakes up spontaneously/.  He reports  feeling refreshed or restored in AM, but he naps quickly- 9.30-10 and needs to be woken-  Lunch is about 12.30 and after lunch he naps again- 45 minutes, and I later afternoon again.  Total daily sleep time over 12 hours.  They moved to [facility] 08-2018 when his memory became impaired..    Review of Systems: Out of a complete 14 system review, the patient complains of only the following symptoms, and all other reviewed  systems are negative.:  Fatigue, sleepiness , snoring, fragmented sleep, hypertension.    How likely are you to doze in the following situations: 0 = not likely, 1 = slight chance, 2 = moderate chance, 3 = high chance   Sitting and Reading? Watching Television? Sitting inactive in a public place (theater or meeting)? As a passenger in a car for an hour without a break? Lying down in the afternoon when circumstances permit? Sitting and talking to someone? Sitting quietly after lunch without alcohol? In a car, while stopped for a few minutes in traffic?   Total =  3/ 24 points . Wife would tend to 16 points.   FSS endorsed at 9/ 63 points.   Social History   Socioeconomic History  . Marital status: Married    Spouse name: Not on file  . Number of children: 2  . Years of education: Not on file  . Highest education level: Not on file  Occupational History  . Occupation: retired  Tobacco Use  . Smoking status: Former Smoker    Packs/day: 0.25    Years: 3.00    Pack years: 0.75    Quit date: 08/21/1966    Years since quitting: 53.5  . Smokeless tobacco: Never Used  Substance and Sexual Activity  . Alcohol use: Yes    Comment: limited  . Drug use: No  . Sexual activity: Yes  Other Topics Concern  . Not on file  Social History Narrative  . Not on file   Social Determinants of Health   Financial Resource Strain:   . Difficulty of Paying Living Expenses:   Food Insecurity:   . Worried About Charity fundraiser in the Last Year:   . Arboriculturist in the Last Year:   Transportation Needs:   . Film/video editor (Medical):   Marland Kitchen Lack of Transportation (Non-Medical):   Physical Activity:   . Days of Exercise per Week:   . Minutes of Exercise per Session:   Stress:   . Feeling of Stress :   Social Connections:   . Frequency of Communication with Friends and Family:   . Frequency of Social Gatherings with Friends and Family:   . Attends Religious Services:   .  Active Member of Clubs or Organizations:   . Attends Archivist Meetings:   Marland Kitchen Marital Status:     Family History  Problem Relation Age of Onset  . Heart disease Father   . Heart attack Father   . Alzheimer's disease Mother   . Heart failure Brother   . Colon cancer Neg Hx   . Stomach cancer Neg Hx   . Esophageal cancer Neg Hx   . Rectal cancer Neg Hx   . Liver cancer Neg Hx     Past Medical History:  Diagnosis Date  . Colon polyps   . Diverticulosis   . Hypercholesterolemia   . Hypertension   . Internal hemorrhoids     Past Surgical History:  Procedure Laterality Date  . EYE SURGERY     X 4  . HERNIA REPAIR  2006   abdominal  . SQUAMOUS CELL CARCINOMA EXCISION     preauricular on the right  . TONSILLECTOMY  1947  . VASECTOMY  1981     Current Outpatient Medications on File Prior to Visit  Medication Sig Dispense Refill  . ASPIRIN 81 PO Take by mouth.    Marland Kitchen atorvastatin (LIPITOR) 20 MG tablet Take 20 mg by mouth daily at 6 PM.     . citalopram (CELEXA) 10 MG tablet Take 10 mg by mouth daily.    Marland Kitchen donepezil (ARICEPT) 10 MG tablet Take 5 mg by mouth at bedtime.     . Multiple Vitamin (MULTIVITAMIN) tablet Take 1 tablet by mouth daily.    . niacinamide 500 MG tablet Take 500 mg by mouth 2 (two) times daily with a meal.    . ramipril (ALTACE) 2.5 MG capsule Take 2.5 mg by mouth daily.     No current facility-administered medications on file prior to visit.  Allergies  Allergen Reactions  . Other Other (See Comments)    Vicryl sutures, pte. Doesn't know what reaction he had, "Drs. Owens Shark + Yeatts deetrmined not to use it on me"    Physical exam:  Today's Vitals   02/19/20 1014  BP: 132/88  Pulse: 61  Temp: (!) 97.2 F (36.2 C)  Weight: 181 lb (82.1 kg)  Height: 6' (1.829 m)   Body mass index is 24.55 kg/m.   Wt Readings from Last 3 Encounters:  02/19/20 181 lb (82.1 kg)  12/19/19 183 lb 8 oz (83.2 kg)  11/28/19 181 lb 9.6 oz (82.4 kg)      Ht Readings from Last 3 Encounters:  02/19/20 6' (1.829 m)  12/19/19 6' (1.829 m)  11/28/19 6' (1.829 m)      General: The patient is awake, alert and appears not in acute distress. The patient is well groomed. Head: Normocephalic, atraumatic. Neck is supple. Mallampati ,  neck circumference:15 inches . Nasal airflow  patent.   Dental status:  Cardiovascular:  Regular rate and cardiac rhythm by pulse,  without distended neck veins. Respiratory: Lungs are clear to auscultation.  Skin:  Without evidence of ankle edema, or rash. Trunk: The patient's posture is erect.   Neurologic exam : The patient is awake and alert, oriented to place and time.   Memory subjective described as impaired. Attention span & concentration ability appears normal. He speaks very louldly and slowly- measured.  Speech is fluent,  without dysarthria, dysphonia or aphasia.  He has hearing aids.   Mood and affect are appropriate.   Cranial nerves: no loss of smell or taste reported  Right eye - natural pupil- ptosis, after nasal septum repaired. Funduscopic exam deferred   Extraocular movements in vertical and horizontal planes were intact and without nystagmus.  No Diplopia. Visual fields by finger perimetry are intact. Hearing was intact to soft voice and finger rubbing.    Facial sensation to fine touch is impaired in the left face- radiation and surgery.  Facial motor strength is asymmetric - the tongue  moved midline.  Uvula was deviated to the left, appears to stick to the pillar. .  Neck ROM : had neck resection -  rotation, tilt and flexion extension were normal for age and shoulder shrug was symmetrical.    Motor exam:  Symmetric bulk, tone and ROM.   Normal tone without cog wheeling, symmetric grip strength .   Sensory:  Fine touch, pinprick and vibration were normal.  Proprioception tested in the upper extremities was normal.   Coordination: Rapid alternating movements in the fingers/hands were  deferred.  The Finger-to-nose maneuver was intact without evidence of ataxia, dysmetria or tremor.   Gait and station: Patient could rise unassisted from a seated position, walked without assistive device.  Stance is of normal width/ base .  Toe and heel walk were deferred.  Deep tendon reflexes: in the upper and lower extremities are symmetric and intact.  Babinski response was deferred.       After spending a total time of 18 minutes face to face and additional time for physical and neurologic examination, review of laboratory studies,  personal review of imaging studies, reports and results of other testing and review of referral information / records as far as provided in visit, I have established the following assessments:  1) early dementia, just borderline to MCI- MOCA was 24/ 30. Stay on Aricept. May need Namenda added in the future.  He has understood  that driving is not encouraged, I will ask him to refrain for the next 3 month until I know more.    2) severe OSA ! Attended sleep study full EEG had failed - due to Insomnia- and HST was needed. He was clearly affected.   In short, Jeffrey [name] is presenting with hypersomnia, in the setting of dementia, and head and neck radiation. He was tested by HST and had severe OSA with little REM exacerbation. CPAP was ordered by ut has not been received.   I plan to follow up either personally or through our NP within 2-3 month.    Electronically signed by: Larey Seat, MD 02/19/2020 10:43 AM  [facility] Board certified by The [employer] of Sleep Medicine and Diplomate of the [employer] of Sleep Medicine. Board certified In Neurology through the [place], Fellow of the [employer] of Neurology. Medical Director of [facility].

## 2020-02-20 DIAGNOSIS — R1319 Other dysphagia: Secondary | ICD-10-CM | POA: Diagnosis not present

## 2020-02-20 DIAGNOSIS — R1313 Dysphagia, pharyngeal phase: Secondary | ICD-10-CM | POA: Diagnosis not present

## 2020-03-05 DIAGNOSIS — W1811XA Fall from or off toilet without subsequent striking against object, initial encounter: Secondary | ICD-10-CM | POA: Diagnosis not present

## 2020-03-05 DIAGNOSIS — Y92012 Bathroom of single-family (private) house as the place of occurrence of the external cause: Secondary | ICD-10-CM | POA: Diagnosis not present

## 2020-03-05 DIAGNOSIS — S0990XA Unspecified injury of head, initial encounter: Secondary | ICD-10-CM | POA: Diagnosis not present

## 2020-03-05 DIAGNOSIS — S199XXA Unspecified injury of neck, initial encounter: Secondary | ICD-10-CM | POA: Diagnosis not present

## 2020-03-05 DIAGNOSIS — F0390 Unspecified dementia without behavioral disturbance: Secondary | ICD-10-CM | POA: Diagnosis not present

## 2020-03-05 DIAGNOSIS — S0993XA Unspecified injury of face, initial encounter: Secondary | ICD-10-CM | POA: Diagnosis not present

## 2020-03-05 DIAGNOSIS — S0512XA Contusion of eyeball and orbital tissues, left eye, initial encounter: Secondary | ICD-10-CM | POA: Diagnosis not present

## 2020-03-09 DIAGNOSIS — G4733 Obstructive sleep apnea (adult) (pediatric): Secondary | ICD-10-CM | POA: Diagnosis not present

## 2020-03-11 DIAGNOSIS — R1312 Dysphagia, oropharyngeal phase: Secondary | ICD-10-CM | POA: Diagnosis not present

## 2020-03-11 DIAGNOSIS — R633 Feeding difficulties: Secondary | ICD-10-CM | POA: Diagnosis not present

## 2020-03-15 ENCOUNTER — Telehealth: Payer: Self-pay

## 2020-03-15 NOTE — Telephone Encounter (Signed)
The Patient's wife called and said that he has fallen 6 times in the month of April. His heart rate has been as low as in the 40's. Bp has been stable.

## 2020-03-15 NOTE — Telephone Encounter (Signed)
I think best option is loop implantation. I will discuss with his daughter Roe Coombs, [place]

## 2020-03-16 ENCOUNTER — Other Ambulatory Visit: Payer: Self-pay

## 2020-03-16 ENCOUNTER — Ambulatory Visit: Payer: PPO

## 2020-03-16 DIAGNOSIS — R278 Other lack of coordination: Secondary | ICD-10-CM | POA: Diagnosis not present

## 2020-03-16 DIAGNOSIS — R2681 Unsteadiness on feet: Secondary | ICD-10-CM | POA: Diagnosis not present

## 2020-03-16 DIAGNOSIS — R296 Repeated falls: Secondary | ICD-10-CM | POA: Diagnosis not present

## 2020-03-16 DIAGNOSIS — M6389 Disorders of muscle in diseases classified elsewhere, multiple sites: Secondary | ICD-10-CM | POA: Diagnosis not present

## 2020-03-16 DIAGNOSIS — R1313 Dysphagia, pharyngeal phase: Secondary | ICD-10-CM | POA: Diagnosis not present

## 2020-03-16 DIAGNOSIS — R55 Syncope and collapse: Secondary | ICD-10-CM | POA: Diagnosis not present

## 2020-03-16 DIAGNOSIS — G301 Alzheimer's disease with late onset: Secondary | ICD-10-CM | POA: Diagnosis not present

## 2020-03-16 DIAGNOSIS — R1319 Other dysphagia: Secondary | ICD-10-CM | POA: Diagnosis not present

## 2020-03-16 DIAGNOSIS — R2689 Other abnormalities of gait and mobility: Secondary | ICD-10-CM | POA: Diagnosis not present

## 2020-03-18 DIAGNOSIS — R1319 Other dysphagia: Secondary | ICD-10-CM | POA: Diagnosis not present

## 2020-03-18 DIAGNOSIS — R1313 Dysphagia, pharyngeal phase: Secondary | ICD-10-CM | POA: Diagnosis not present

## 2020-03-19 DIAGNOSIS — R1313 Dysphagia, pharyngeal phase: Secondary | ICD-10-CM | POA: Diagnosis not present

## 2020-03-19 DIAGNOSIS — R1319 Other dysphagia: Secondary | ICD-10-CM | POA: Diagnosis not present

## 2020-03-23 DIAGNOSIS — R1313 Dysphagia, pharyngeal phase: Secondary | ICD-10-CM | POA: Diagnosis not present

## 2020-03-23 DIAGNOSIS — R1319 Other dysphagia: Secondary | ICD-10-CM | POA: Diagnosis not present

## 2020-03-24 DIAGNOSIS — R2681 Unsteadiness on feet: Secondary | ICD-10-CM | POA: Diagnosis not present

## 2020-03-24 DIAGNOSIS — R2689 Other abnormalities of gait and mobility: Secondary | ICD-10-CM | POA: Diagnosis not present

## 2020-03-24 DIAGNOSIS — R296 Repeated falls: Secondary | ICD-10-CM | POA: Diagnosis not present

## 2020-03-24 DIAGNOSIS — Z125 Encounter for screening for malignant neoplasm of prostate: Secondary | ICD-10-CM | POA: Diagnosis not present

## 2020-03-24 DIAGNOSIS — R278 Other lack of coordination: Secondary | ICD-10-CM | POA: Diagnosis not present

## 2020-03-24 DIAGNOSIS — M859 Disorder of bone density and structure, unspecified: Secondary | ICD-10-CM | POA: Diagnosis not present

## 2020-03-24 DIAGNOSIS — G301 Alzheimer's disease with late onset: Secondary | ICD-10-CM | POA: Diagnosis not present

## 2020-03-24 DIAGNOSIS — R7301 Impaired fasting glucose: Secondary | ICD-10-CM | POA: Diagnosis not present

## 2020-03-24 DIAGNOSIS — M6389 Disorders of muscle in diseases classified elsewhere, multiple sites: Secondary | ICD-10-CM | POA: Diagnosis not present

## 2020-03-24 DIAGNOSIS — E7849 Other hyperlipidemia: Secondary | ICD-10-CM | POA: Diagnosis not present

## 2020-03-25 DIAGNOSIS — R1313 Dysphagia, pharyngeal phase: Secondary | ICD-10-CM | POA: Diagnosis not present

## 2020-03-25 DIAGNOSIS — R1319 Other dysphagia: Secondary | ICD-10-CM | POA: Diagnosis not present

## 2020-03-26 DIAGNOSIS — R1319 Other dysphagia: Secondary | ICD-10-CM | POA: Diagnosis not present

## 2020-03-26 DIAGNOSIS — R1313 Dysphagia, pharyngeal phase: Secondary | ICD-10-CM | POA: Diagnosis not present

## 2020-03-29 DIAGNOSIS — I517 Cardiomegaly: Secondary | ICD-10-CM | POA: Diagnosis not present

## 2020-03-29 DIAGNOSIS — I6389 Other cerebral infarction: Secondary | ICD-10-CM | POA: Diagnosis not present

## 2020-03-29 DIAGNOSIS — R269 Unspecified abnormalities of gait and mobility: Secondary | ICD-10-CM | POA: Diagnosis not present

## 2020-03-29 DIAGNOSIS — Z0000 Encounter for general adult medical examination without abnormal findings: Secondary | ICD-10-CM | POA: Diagnosis not present

## 2020-03-29 DIAGNOSIS — Z1212 Encounter for screening for malignant neoplasm of rectum: Secondary | ICD-10-CM | POA: Diagnosis not present

## 2020-03-29 DIAGNOSIS — I129 Hypertensive chronic kidney disease with stage 1 through stage 4 chronic kidney disease, or unspecified chronic kidney disease: Secondary | ICD-10-CM | POA: Diagnosis not present

## 2020-03-29 DIAGNOSIS — R1319 Other dysphagia: Secondary | ICD-10-CM | POA: Diagnosis not present

## 2020-03-29 DIAGNOSIS — R9431 Abnormal electrocardiogram [ECG] [EKG]: Secondary | ICD-10-CM | POA: Diagnosis not present

## 2020-03-29 DIAGNOSIS — I700 Atherosclerosis of aorta: Secondary | ICD-10-CM | POA: Diagnosis not present

## 2020-03-29 DIAGNOSIS — I351 Nonrheumatic aortic (valve) insufficiency: Secondary | ICD-10-CM | POA: Diagnosis not present

## 2020-03-29 DIAGNOSIS — R82998 Other abnormal findings in urine: Secondary | ICD-10-CM | POA: Diagnosis not present

## 2020-03-29 DIAGNOSIS — I6529 Occlusion and stenosis of unspecified carotid artery: Secondary | ICD-10-CM | POA: Diagnosis not present

## 2020-03-29 DIAGNOSIS — I519 Heart disease, unspecified: Secondary | ICD-10-CM | POA: Diagnosis not present

## 2020-03-29 DIAGNOSIS — G300 Alzheimer's disease with early onset: Secondary | ICD-10-CM | POA: Diagnosis not present

## 2020-03-29 DIAGNOSIS — R1313 Dysphagia, pharyngeal phase: Secondary | ICD-10-CM | POA: Diagnosis not present

## 2020-03-29 DIAGNOSIS — R55 Syncope and collapse: Secondary | ICD-10-CM | POA: Diagnosis not present

## 2020-03-29 NOTE — Progress Notes (Signed)
External Labs: Collected: 03/24/2020 Creatinine 1.1 mg/dL. eGFR: 65 mL/min per 1.73 m Lipid profile: Total cholesterol 142 triglycerides 38, HDL 58, LDL 76 non-HDL 84 apolipoprotein B 60. TSH: 1.2

## 2020-03-30 DIAGNOSIS — R1313 Dysphagia, pharyngeal phase: Secondary | ICD-10-CM | POA: Diagnosis not present

## 2020-03-30 DIAGNOSIS — R1319 Other dysphagia: Secondary | ICD-10-CM | POA: Diagnosis not present

## 2020-03-31 DIAGNOSIS — R1319 Other dysphagia: Secondary | ICD-10-CM | POA: Diagnosis not present

## 2020-03-31 DIAGNOSIS — R1313 Dysphagia, pharyngeal phase: Secondary | ICD-10-CM | POA: Diagnosis not present

## 2020-04-01 DIAGNOSIS — R1313 Dysphagia, pharyngeal phase: Secondary | ICD-10-CM | POA: Diagnosis not present

## 2020-04-01 DIAGNOSIS — R1319 Other dysphagia: Secondary | ICD-10-CM | POA: Diagnosis not present

## 2020-04-08 DIAGNOSIS — G4733 Obstructive sleep apnea (adult) (pediatric): Secondary | ICD-10-CM | POA: Diagnosis not present

## 2020-04-08 NOTE — Progress Notes (Signed)
No symptoms reported. No heart block. No atrial fibrillation. Continue to observe. D/W his daughter Jeffrey [name].

## 2020-04-13 DIAGNOSIS — G4733 Obstructive sleep apnea (adult) (pediatric): Secondary | ICD-10-CM | POA: Diagnosis not present

## 2020-04-14 DIAGNOSIS — C4441 Basal cell carcinoma of skin of scalp and neck: Secondary | ICD-10-CM | POA: Diagnosis not present

## 2020-04-14 DIAGNOSIS — L570 Actinic keratosis: Secondary | ICD-10-CM | POA: Diagnosis not present

## 2020-04-26 DIAGNOSIS — R296 Repeated falls: Secondary | ICD-10-CM | POA: Diagnosis not present

## 2020-04-26 DIAGNOSIS — R2689 Other abnormalities of gait and mobility: Secondary | ICD-10-CM | POA: Diagnosis not present

## 2020-04-26 DIAGNOSIS — R2681 Unsteadiness on feet: Secondary | ICD-10-CM | POA: Diagnosis not present

## 2020-04-26 DIAGNOSIS — M6389 Disorders of muscle in diseases classified elsewhere, multiple sites: Secondary | ICD-10-CM | POA: Diagnosis not present

## 2020-04-26 DIAGNOSIS — G301 Alzheimer's disease with late onset: Secondary | ICD-10-CM | POA: Diagnosis not present

## 2020-04-26 DIAGNOSIS — R278 Other lack of coordination: Secondary | ICD-10-CM | POA: Diagnosis not present

## 2020-04-28 DIAGNOSIS — G4733 Obstructive sleep apnea (adult) (pediatric): Secondary | ICD-10-CM | POA: Diagnosis not present

## 2020-04-30 DIAGNOSIS — M6389 Disorders of muscle in diseases classified elsewhere, multiple sites: Secondary | ICD-10-CM | POA: Diagnosis not present

## 2020-04-30 DIAGNOSIS — R2681 Unsteadiness on feet: Secondary | ICD-10-CM | POA: Diagnosis not present

## 2020-04-30 DIAGNOSIS — R2689 Other abnormalities of gait and mobility: Secondary | ICD-10-CM | POA: Diagnosis not present

## 2020-04-30 DIAGNOSIS — G301 Alzheimer's disease with late onset: Secondary | ICD-10-CM | POA: Diagnosis not present

## 2020-04-30 DIAGNOSIS — R296 Repeated falls: Secondary | ICD-10-CM | POA: Diagnosis not present

## 2020-04-30 DIAGNOSIS — R278 Other lack of coordination: Secondary | ICD-10-CM | POA: Diagnosis not present

## 2020-05-09 DIAGNOSIS — G4733 Obstructive sleep apnea (adult) (pediatric): Secondary | ICD-10-CM | POA: Diagnosis not present

## 2020-05-11 DIAGNOSIS — R2689 Other abnormalities of gait and mobility: Secondary | ICD-10-CM | POA: Diagnosis not present

## 2020-05-11 DIAGNOSIS — M6389 Disorders of muscle in diseases classified elsewhere, multiple sites: Secondary | ICD-10-CM | POA: Diagnosis not present

## 2020-05-11 DIAGNOSIS — R2681 Unsteadiness on feet: Secondary | ICD-10-CM | POA: Diagnosis not present

## 2020-05-11 DIAGNOSIS — G301 Alzheimer's disease with late onset: Secondary | ICD-10-CM | POA: Diagnosis not present

## 2020-05-11 DIAGNOSIS — R278 Other lack of coordination: Secondary | ICD-10-CM | POA: Diagnosis not present

## 2020-05-11 DIAGNOSIS — R296 Repeated falls: Secondary | ICD-10-CM | POA: Diagnosis not present

## 2020-05-13 ENCOUNTER — Encounter: Payer: Self-pay | Admitting: Neurology

## 2020-05-13 DIAGNOSIS — R2681 Unsteadiness on feet: Secondary | ICD-10-CM | POA: Diagnosis not present

## 2020-05-13 DIAGNOSIS — M6389 Disorders of muscle in diseases classified elsewhere, multiple sites: Secondary | ICD-10-CM | POA: Diagnosis not present

## 2020-05-13 DIAGNOSIS — R296 Repeated falls: Secondary | ICD-10-CM | POA: Diagnosis not present

## 2020-05-13 DIAGNOSIS — R2689 Other abnormalities of gait and mobility: Secondary | ICD-10-CM | POA: Diagnosis not present

## 2020-05-13 DIAGNOSIS — R278 Other lack of coordination: Secondary | ICD-10-CM | POA: Diagnosis not present

## 2020-05-13 DIAGNOSIS — G301 Alzheimer's disease with late onset: Secondary | ICD-10-CM | POA: Diagnosis not present

## 2020-05-18 DIAGNOSIS — G301 Alzheimer's disease with late onset: Secondary | ICD-10-CM | POA: Diagnosis not present

## 2020-05-18 DIAGNOSIS — R278 Other lack of coordination: Secondary | ICD-10-CM | POA: Diagnosis not present

## 2020-05-18 DIAGNOSIS — R296 Repeated falls: Secondary | ICD-10-CM | POA: Diagnosis not present

## 2020-05-18 DIAGNOSIS — M6389 Disorders of muscle in diseases classified elsewhere, multiple sites: Secondary | ICD-10-CM | POA: Diagnosis not present

## 2020-05-18 DIAGNOSIS — R2681 Unsteadiness on feet: Secondary | ICD-10-CM | POA: Diagnosis not present

## 2020-05-18 DIAGNOSIS — R2689 Other abnormalities of gait and mobility: Secondary | ICD-10-CM | POA: Diagnosis not present

## 2020-05-19 ENCOUNTER — Ambulatory Visit: Payer: PPO | Admitting: Neurology

## 2020-05-19 ENCOUNTER — Encounter: Payer: Self-pay | Admitting: Neurology

## 2020-05-19 DIAGNOSIS — I6523 Occlusion and stenosis of bilateral carotid arteries: Secondary | ICD-10-CM | POA: Diagnosis not present

## 2020-05-19 NOTE — Progress Notes (Signed)
SLEEP MEDICINE CLINIC    Provider:  Larey Seat, MD  Primary Care Physician:  Crist Infante, [place] [address]     Referring Provider: Dr. Adrian Prows, MD         Chief Complaint according to patient   Patient presents with:    . New Patient (Initial Visit)     pt wife for initial CPAP follow up in severe OSA-  states that sometimes the machine works and other times it doesnt. the wife states that seems to be the mask is not getting a good seal. He has fallen 10 times since January- and he wants to drive- Had ST and a barium swallow test showed some pocketing but no aspiration.       HISTORY OF PRESENT ILLNESS:      RV on 05-19-2020.  Jeffrey [name] is a 79 y.o. year old Caucasian male patient with dementia and OSA-, with good response to CPAP treatment in the setting of OSA- He is bored and that , he feels, lets him to sleep more.  Sleep apnea identified in the study from 3-24 2021 by home sleep test revealed severe obstructive sleep apnea with an AHI of 32.6 RDI was 35.8 indicating some additional snoring.  In rem sleep there was a slightly higher degree of apnea AHI was 39.4.  The patient started on AutoSet pressure therapy he has been very compliant 100% of the last 30 days he has used the machine and each night over 4 hours consecutively.  The average user time is 7 hours 50 minutes.  The AutoSet machine offers a pressure range between 5 and 16 cmH2O with 3 cm EPR and his residual AHI is 2.5/h now he does have some air leaks but they seem not to affect the apnea control and the 95th percentile pressure is 12 cmH2O well within the current settings so I do not have to change any.  Of the remaining low apnea count 1.8 as central in nature and 0.1 obstructive.  I would like for the patient to continue with this therapy.  His fatigue score was endorsed at 9 and his Epworth sleepiness score at 1 point.  And the patient has changed to Exelon po now increased to  3 mg bid.     Following up today on 02/19/2020. His PSG did not give any answers as he couldn't sleep. He underwent HST and has been given a diagnosis pf sevre OSA, with bradycardia and AHI of 32/h. CPAP is needed . We discussed in detail the potential benefits of OSA treatment for bradycardia and hopefully benefits for his underlying dementia. Due to his facial structures I requested and evaluation face- to -face.     12-19-2019. I will perform a MOCA The problem of recurrent insomnia is discussed.I reviewed his 2 cardiac monitor test results from Dr Milderd Meager office.  One episode of bradycardia, one of tachycardia- both reportedly asymptomatic.  He is awaiting sleep study with full EEG on 12-29-2019.   We will do MOCA today- and discuss interval history- Mr Jeffrey [name] is denying any trouble. His wife gives additional information. He fell at night over the toilet, he could not walk and stand steady the following day - he bumped into the left - and was " a little spacey  His BP was all over the place. And this morning he had 94/ 68 mmHg on the left, and the right sided BP was also low at  109/  73 mmHg. He is at high fall risk, and he has cognitive impairment, too. MMSE was 28/ 30, but is not detecting differential difficulties, such as visio-spatial problems.  MOCA revealed some word delay, he missed all 5 recall words and he was not able to abstract as well-.  MOCA was 24/ 30 points. He has a delayed reaction time- he shall no longer drive.    Seen here upon referral by cardiology on  05/19/2020.  We will get the first and be correct.  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  Which included scapular skin flap closure.  Removal of the parotid gland the TMJ joint the lymph nodes C-section branches of the trigeminal nerve were involved and damaged surgery was followed by radiation treatments  he was followed by [facility]. Caryl [facility].  In 2019 he had knee pain and some balance issues he did physical therapy and received 2 steroid shots to the knee.  In 2018 he had an obstructed tear ducts that needed to be repaired deviated septum and a lift of the area under the outer right eye.  This was again done by Dr. Janeal Holmes.  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  In 2020 July 21 he had a fall at [facility] hit his head had a facial laceration and felt dizzy and weak could not walk followed by an ER visit at [facility] CT negative there was no contusion laceration was treated.  In October again feeling of dizziness and weakness while walking in the morning sat down did not fall saw the nurse practitioner at Dr. Tempie Donning office who started a referral to cardiology.  He has a history of hypertension, has a history of mild dementia early stages, cardiology diagnosed him with carotid artery stenosis but also kidneys chronic kidney disease stage II, hyperlipidemia, you recently had these 2 with syncope or near syncopal episodes so he underwent a Lexiscan nuclear stress test which showed only a small area of ischemia in the inner apical wall but it was still felt to be a low risk study.  Echocardiogram was essentially normal carotid duplex showed left external carotid over 50% stenosis otherwise mild this would not be affecting his brain function.  He was placed on a 2-week cardiac monitor there were no arrhythmias but he did have bradycardia and supraventricular tachycardia.  Blood pressures have reportedly been stable.   Chief concern according to patient :  " I am so sleepy"     I have the pleasure of seeing Jeffrey [name] today, a right-handed Caucasian male for evaluation of a possible sleep disorder.      Family medical /sleep history: no other family member on CPAP with OSA, insomnia, sleep walking.    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 Daughter is a PA with [employer].  Pets are not present. Tobacco use: quit 54 years ago- in the [employer]. [place].   ETOH use none , Caffeine intake in form of Coffee( 2 cups in AM) Soda( none) Tea ( caffeinated at dinner ) or energy drinks.Regular exercise in form of - walking   Hobbies : none    Sleep  habits are as follows: The patient's dinner time is between 5-6.30 PM. The patient goes to bed at 10 PM and continues to sleep for 7-8 hours, wakes for 3-4  bathroom breaks, the first time at 1 AM.   The preferred sleep position is supine and side, with the support of 2 pillows.  Dreams are reportedly rare. He belches a lot o at night.    7.30  AM is the usual rise time. The patient wakes up spontaneously/.  He reports  feeling refreshed or restored in AM, but he naps quickly- 9.30-10 and needs to be woken-  Lunch is about 12.30 and after lunch he naps again- 45 minutes, and I later afternoon again.  Total daily sleep time over 12 hours.  They moved to [facility] 08-[place] when his memory became impaired..    Review of Systems: Out of a complete 14 system review, the patient complains of only the following symptoms, and all other reviewed systems are negative.:  Fatigue, sleepiness , snoring, fragmented sleep, hypertension.    How likely are you to doze in the following situations: 0 = not likely, 1 = slight chance, 2 = moderate chance, 3 = high chance   Sitting and Reading? Watching Television? Sitting inactive in a public place (theater or meeting)? As a passenger in a car for an hour without a break? Lying down in the afternoon when circumstances permit? Sitting and talking to someone? Sitting quietly after lunch without alcohol? In a car, while stopped for a few minutes in traffic?   Total = 3/ 24 points . Wife would  tend to 16 points.   FSS endorsed at 9/ 63 points.   Social History   Socioeconomic History  . Marital status: Married    Spouse name: Not on file  . Number of children: 2  . Years of education: Not on file  . Highest education level: Not on file  Occupational History  . Occupation: retired  Tobacco Use  . Smoking status: Former Smoker    Packs/day: 0.25    Years: 3.00    Pack years: 0.75    Quit date: 08/21/1966    Years since quitting: 53.7  . Smokeless tobacco: Never Used  Vaping Use  . Vaping Use: Never used  Substance and Sexual Activity  . Alcohol use: Yes    Comment: limited  . Drug use: No  . Sexual activity: Yes  Other Topics Concern  . Not on file  Social History Narrative  . Not on file   Social Determinants of Health   Financial Resource Strain:   . Difficulty of Paying Living Expenses:   Food Insecurity:   . Worried About Charity fundraiser in the Last Year:   . Arboriculturist in the Last Year:   Transportation Needs:   . Film/video editor (Medical):   Marland Kitchen Lack of Transportation (Non-Medical):   Physical Activity:   . Days of Exercise per Week:   . Minutes of Exercise per Session:   Stress:   . Feeling of Stress :   Social Connections:   . Frequency of Communication with Friends and Family:   . Frequency of Social Gatherings with Friends and Family:   . Attends Religious Services:   . Active Member of Clubs or Organizations:   . Attends Archivist Meetings:   Marland Kitchen Marital Status:     Family History  Problem Relation Age of Onset  . Heart disease Father   .  Heart attack Father   . Alzheimer's disease Mother   . Heart failure Brother   . Colon cancer Neg Hx   . Stomach cancer Neg Hx   . Esophageal cancer Neg Hx   . Rectal cancer Neg Hx   . Liver cancer Neg Hx     Past Medical History:  Diagnosis Date  . Colon polyps   . Diverticulosis   . Hypercholesterolemia   . Hypertension   . Internal hemorrhoids     Past  Surgical History:  Procedure Laterality Date  . EYE SURGERY     X 4  . HERNIA REPAIR  2006   abdominal  . SQUAMOUS CELL CARCINOMA EXCISION     preauricular on the right  . TONSILLECTOMY  1947  . VASECTOMY  1981     Current Outpatient Medications on File Prior to Visit  Medication Sig Dispense Refill  . ASPIRIN 81 PO Take by mouth.    Marland Kitchen atorvastatin (LIPITOR) 20 MG tablet Take 20 mg by mouth daily at 6 PM.     . citalopram (CELEXA) 10 MG tablet Take 10 mg by mouth daily.    . Multiple Vitamin (MULTIVITAMIN) tablet Take 1 tablet by mouth daily.    . niacinamide 500 MG tablet Take 500 mg by mouth 2 (two) times daily with a meal.    . ramipril (ALTACE) 2.5 MG capsule Take 2.5 mg by mouth daily.    . rivastigmine (EXELON) 3 MG capsule Take 3 mg by mouth in the morning and at bedtime.     No current facility-administered medications on file prior to visit.    Allergies  Allergen Reactions  . Other Other (See Comments)    Vicryl sutures, pte. Doesn't know what reaction he had, "Drs. Owens Shark + Yeatts deetrmined not to use it on me"    Physical exam:  Today's Vitals   05/19/20 1318  BP: 109/71  Pulse: 81  Weight: 182 lb (82.6 kg)  Height: 6' (1.829 m)   Body mass index is 24.68 kg/m.   Wt Readings from Last 3 Encounters:  05/19/20 182 lb (82.6 kg)  02/19/20 181 lb (82.1 kg)  12/19/19 183 lb 8 oz (83.2 kg)     Ht Readings from Last 3 Encounters:  05/19/20 6' (1.829 m)  02/19/20 6' (1.829 m)  12/19/19 6' (1.829 m)      General: The patient is awake, alert and appears not in acute distress. The patient is well groomed. Head: Normocephalic, atraumatic. Neck is supple. Mallampati ,  neck circumference:15 inches . Nasal airflow  patent.   Dental status:  Cardiovascular:  Regular rate and cardiac rhythm by pulse,  without distended neck veins. Respiratory: Lungs are clear to auscultation.  Skin:  Without evidence of ankle edema, or rash. Trunk: The patient's posture is  erect.   Neurologic exam : The patient is awake and alert, oriented to place and time.   Memory subjective described as impaired. Attention span & concentration ability appears normal. He speaks very louldly and slowly- measured.  Speech is fluent,  without dysarthria, dysphonia or aphasia.  He has hearing aids.   Mood and affect are appropriate.   Cranial nerves: no loss of smell or taste reported  Right eye - natural pupil- ptosis, after nasal septum repaired. Funduscopic exam deferred   Extraocular movements in vertical and horizontal planes were intact and without nystagmus.  No Diplopia. Visual fields by finger perimetry are intact. Hearing was intact to soft voice and finger  rubbing.    Facial sensation to fine touch is impaired in the left face- radiation and surgery.  Facial motor strength is asymmetric - the tongue  moved midline.  Uvula was deviated to the left, appears to stick to the pillar. .  Neck ROM : had neck resection -  rotation, tilt and flexion extension were normal for age and shoulder shrug was symmetrical.    Motor exam:  Symmetric bulk, tone and ROM.   Normal tone without cog wheeling, symmetric grip strength .   Sensory:  Fine touch, pinprick and vibration were normal.  Proprioception tested in the upper extremities was normal.   Coordination: Rapid alternating movements in the fingers/hands were deferred.  The Finger-to-nose maneuver was intact without evidence of ataxia, dysmetria or tremor.   Gait and station: Patient could rise unassisted from a seated position, walked without assistive device.  Stance is of normal width/ base .  Toe and heel walk were deferred.  Deep tendon reflexes: in the upper and lower extremities are symmetric and intact.  Babinski response was deferred.       After spending a total time of 18 minutes face to face and additional time for physical and neurologic examination, review of laboratory studies,  personal review of  imaging studies, reports and results of other testing and review of referral information / records as far as provided in visit, I have established the following assessments:  1) early dementia, just borderline to MCI- MOCA was 24/ 30. Stay on [place] .  He has understood that driving is not encouraged, I will ask him to refrain for the next 3 month until I know more. We will repeat memory testing in October.   2) severe OSA ! Attended sleep study full EEG had failed - due to Insomnia- and HST was needed. He was clearly affected. Severe OSA confirmed and treated on auto CPAP with a very good resolution, some residual centrals- not bad- and best mask fit for his special needs.    In short, LAMARK SCHUE is presenting with hypersomnia, in the setting of dementia, and head and neck radiation. He was tested by HST and had severe OSA with little REM exacerbation.  CPAP  To continue at current settings.   I plan to follow up either personally or through our NP within 2-3 month.    Electronically signed by: Larey Seat, MD 05/19/2020 1:45 PM  [facility] Sleep Board certified by The [employer] of Sleep Medicine and Diplomate of the [employer] of Sleep Medicine. Board certified In Neurology through the [employer], Fellow of the [employer] of Neurology. Medical Director of [employer].

## 2020-05-19 NOTE — Patient Instructions (Signed)
Rivastigmine capsules What is this medicine? RIVASTIGMINE (ri va STIG meen) is used to treat mild to moderate dementia caused by Alzheimer's disease or Parkinson's disease. This medicine may be used for other purposes; ask your health care provider or pharmacist if you have questions. COMMON BRAND NAME(S): Exelon What should I tell my health care provider before I take this medicine? They need to know if you have any of these conditions:  difficulty passing urine  heart disease, or irregular or slow heartbeat  kidney disease  liver disease  lung or breathing disease, like asthma  seizures  stomach or intestine disease, ulcers, or stomach bleeding  an unusual or allergic reaction to rivastigmine, other medicines, foods, dyes, or preservatives  pregnant or trying to get pregnant  breast-feeding How should I use this medicine? Take this medicine by mouth with a glass of water. Follow the directions on the prescription label. Take this medicine with food. Take your doses at regular intervals. Do not take your medicine more often than directed. Do not stop taking except on the advice of your doctor or health care professional. Talk to your pediatrician regarding the use of this medicine in children. Special care may be needed. Overdosage: If you think you have taken too much of this medicine contact a poison control center or emergency room at once. NOTE: This medicine is only for you. Do not share this medicine with others. What if I miss a dose? If you miss a dose, take it as soon as you can. If it is almost time for your next dose, take only that dose. Do not take double or extra doses. What may interact with this medicine?  antihistamines for allergy, cough and cold  atropine  certain medicines for bladder problems like oxybutynin, tolterodine  certain medicines for Parkinson's disease like benztropine, trihexyphenidyl  certain medicines for stomach problems like  dicyclomine, hyoscyamine  glycopyrrolate  ipratropium  certain medicines for travel sickness like scopolamine  medicines that relax your muscles for surgery  other medicines for Alzheimer's disease This list may not describe all possible interactions. Give your health care provider a list of all the medicines, herbs, non-prescription drugs, or dietary supplements you use. Also tell them if you smoke, drink alcohol, or use illegal drugs. Some items may interact with your medicine. What should I watch for while using this medicine? Visit your doctor or health care professional for regular checks on your progress. Check with your doctor or health care professional if your symptoms do not get better or if they get worse. You may get drowsy or dizzy. Do not drive, use machinery, or do anything that needs mental alertness until you know how this drug affects you. What side effects may I notice from receiving this medicine? Side effects that you should report to your doctor or health care professional as soon as possible:  allergic reactions like skin rash, itching or hives, swelling of the face, lips, or tongue  changes in vision or balance  feeling faint or lightheaded, falls  increase in frequency of passing urine, or incontinence  nervousness, agitation, or increased confusion  redness, blistering, peeling or loosening of the skin, including inside the mouth  severe diarrhea  slow heartbeat, or palpitations  stomach pain  sweating  uncontrollable movements  vomiting  weight loss Side effects that usually do not require medical attention (report to your doctor or health care professional if they continue or are bothersome):  headache  indigestion or heartburn  loss of  appetite  mild diarrhea, especially when starting treatment  nausea This list may not describe all possible side effects. Call your doctor for medical advice about side effects. You may report side  effects to FDA at 1-800-FDA-1088. Where should I keep my medicine? Keep out of reach of children. Store at room temperature between 15 and 30 degrees C (59 and 86 degrees F). Keep container tightly closed. Throw away any unused medicine after the expiration date. NOTE: This sheet is a summary. It may not cover all possible information. If you have questions about this medicine, talk to your doctor, pharmacist, or health care provider.  2020 Elsevier/Gold Standard (2008-06-29 14:15:23)

## 2020-05-20 DIAGNOSIS — R296 Repeated falls: Secondary | ICD-10-CM | POA: Diagnosis not present

## 2020-05-20 DIAGNOSIS — R2689 Other abnormalities of gait and mobility: Secondary | ICD-10-CM | POA: Diagnosis not present

## 2020-05-20 DIAGNOSIS — R278 Other lack of coordination: Secondary | ICD-10-CM | POA: Diagnosis not present

## 2020-05-20 DIAGNOSIS — M6389 Disorders of muscle in diseases classified elsewhere, multiple sites: Secondary | ICD-10-CM | POA: Diagnosis not present

## 2020-05-20 DIAGNOSIS — R2681 Unsteadiness on feet: Secondary | ICD-10-CM | POA: Diagnosis not present

## 2020-05-20 DIAGNOSIS — G301 Alzheimer's disease with late onset: Secondary | ICD-10-CM | POA: Diagnosis not present

## 2020-05-25 DIAGNOSIS — R296 Repeated falls: Secondary | ICD-10-CM | POA: Diagnosis not present

## 2020-05-25 DIAGNOSIS — R2681 Unsteadiness on feet: Secondary | ICD-10-CM | POA: Diagnosis not present

## 2020-05-25 DIAGNOSIS — R2689 Other abnormalities of gait and mobility: Secondary | ICD-10-CM | POA: Diagnosis not present

## 2020-05-25 DIAGNOSIS — G301 Alzheimer's disease with late onset: Secondary | ICD-10-CM | POA: Diagnosis not present

## 2020-05-25 DIAGNOSIS — R278 Other lack of coordination: Secondary | ICD-10-CM | POA: Diagnosis not present

## 2020-05-25 DIAGNOSIS — M6389 Disorders of muscle in diseases classified elsewhere, multiple sites: Secondary | ICD-10-CM | POA: Diagnosis not present

## 2020-05-26 DIAGNOSIS — C44219 Basal cell carcinoma of skin of left ear and external auricular canal: Secondary | ICD-10-CM | POA: Diagnosis not present

## 2020-05-27 ENCOUNTER — Ambulatory Visit: Payer: PPO | Admitting: Cardiology

## 2020-05-27 DIAGNOSIS — R2681 Unsteadiness on feet: Secondary | ICD-10-CM | POA: Diagnosis not present

## 2020-05-27 DIAGNOSIS — R2689 Other abnormalities of gait and mobility: Secondary | ICD-10-CM | POA: Diagnosis not present

## 2020-05-27 DIAGNOSIS — M6389 Disorders of muscle in diseases classified elsewhere, multiple sites: Secondary | ICD-10-CM | POA: Diagnosis not present

## 2020-05-27 DIAGNOSIS — G301 Alzheimer's disease with late onset: Secondary | ICD-10-CM | POA: Diagnosis not present

## 2020-05-27 DIAGNOSIS — R296 Repeated falls: Secondary | ICD-10-CM | POA: Diagnosis not present

## 2020-05-27 DIAGNOSIS — R278 Other lack of coordination: Secondary | ICD-10-CM | POA: Diagnosis not present

## 2020-06-01 DIAGNOSIS — R2681 Unsteadiness on feet: Secondary | ICD-10-CM | POA: Diagnosis not present

## 2020-06-01 DIAGNOSIS — R278 Other lack of coordination: Secondary | ICD-10-CM | POA: Diagnosis not present

## 2020-06-01 DIAGNOSIS — R2689 Other abnormalities of gait and mobility: Secondary | ICD-10-CM | POA: Diagnosis not present

## 2020-06-01 DIAGNOSIS — R296 Repeated falls: Secondary | ICD-10-CM | POA: Diagnosis not present

## 2020-06-01 DIAGNOSIS — M6389 Disorders of muscle in diseases classified elsewhere, multiple sites: Secondary | ICD-10-CM | POA: Diagnosis not present

## 2020-06-01 DIAGNOSIS — G301 Alzheimer's disease with late onset: Secondary | ICD-10-CM | POA: Diagnosis not present

## 2020-06-03 DIAGNOSIS — G301 Alzheimer's disease with late onset: Secondary | ICD-10-CM | POA: Diagnosis not present

## 2020-06-03 DIAGNOSIS — M6389 Disorders of muscle in diseases classified elsewhere, multiple sites: Secondary | ICD-10-CM | POA: Diagnosis not present

## 2020-06-03 DIAGNOSIS — R2681 Unsteadiness on feet: Secondary | ICD-10-CM | POA: Diagnosis not present

## 2020-06-03 DIAGNOSIS — R278 Other lack of coordination: Secondary | ICD-10-CM | POA: Diagnosis not present

## 2020-06-03 DIAGNOSIS — R296 Repeated falls: Secondary | ICD-10-CM | POA: Diagnosis not present

## 2020-06-03 DIAGNOSIS — R2689 Other abnormalities of gait and mobility: Secondary | ICD-10-CM | POA: Diagnosis not present

## 2020-06-07 DIAGNOSIS — Z85828 Personal history of other malignant neoplasm of skin: Secondary | ICD-10-CM | POA: Diagnosis not present

## 2020-06-07 DIAGNOSIS — G301 Alzheimer's disease with late onset: Secondary | ICD-10-CM | POA: Diagnosis not present

## 2020-06-07 DIAGNOSIS — C44222 Squamous cell carcinoma of skin of right ear and external auricular canal: Secondary | ICD-10-CM | POA: Diagnosis not present

## 2020-06-07 DIAGNOSIS — R296 Repeated falls: Secondary | ICD-10-CM | POA: Diagnosis not present

## 2020-06-07 DIAGNOSIS — H905 Unspecified sensorineural hearing loss: Secondary | ICD-10-CM | POA: Diagnosis not present

## 2020-06-07 DIAGNOSIS — Z483 Aftercare following surgery for neoplasm: Secondary | ICD-10-CM | POA: Diagnosis not present

## 2020-06-07 DIAGNOSIS — R278 Other lack of coordination: Secondary | ICD-10-CM | POA: Diagnosis not present

## 2020-06-07 DIAGNOSIS — F0390 Unspecified dementia without behavioral disturbance: Secondary | ICD-10-CM | POA: Diagnosis not present

## 2020-06-07 DIAGNOSIS — R1310 Dysphagia, unspecified: Secondary | ICD-10-CM | POA: Diagnosis not present

## 2020-06-07 DIAGNOSIS — R2681 Unsteadiness on feet: Secondary | ICD-10-CM | POA: Diagnosis not present

## 2020-06-07 DIAGNOSIS — M6389 Disorders of muscle in diseases classified elsewhere, multiple sites: Secondary | ICD-10-CM | POA: Diagnosis not present

## 2020-06-07 DIAGNOSIS — F0280 Dementia in other diseases classified elsewhere without behavioral disturbance: Secondary | ICD-10-CM | POA: Diagnosis not present

## 2020-06-07 DIAGNOSIS — R2689 Other abnormalities of gait and mobility: Secondary | ICD-10-CM | POA: Diagnosis not present

## 2020-06-08 DIAGNOSIS — R296 Repeated falls: Secondary | ICD-10-CM | POA: Diagnosis not present

## 2020-06-08 DIAGNOSIS — R2689 Other abnormalities of gait and mobility: Secondary | ICD-10-CM | POA: Diagnosis not present

## 2020-06-08 DIAGNOSIS — R278 Other lack of coordination: Secondary | ICD-10-CM | POA: Diagnosis not present

## 2020-06-08 DIAGNOSIS — M6389 Disorders of muscle in diseases classified elsewhere, multiple sites: Secondary | ICD-10-CM | POA: Diagnosis not present

## 2020-06-08 DIAGNOSIS — G301 Alzheimer's disease with late onset: Secondary | ICD-10-CM | POA: Diagnosis not present

## 2020-06-08 DIAGNOSIS — R2681 Unsteadiness on feet: Secondary | ICD-10-CM | POA: Diagnosis not present

## 2020-06-15 DIAGNOSIS — R296 Repeated falls: Secondary | ICD-10-CM | POA: Diagnosis not present

## 2020-06-15 DIAGNOSIS — G301 Alzheimer's disease with late onset: Secondary | ICD-10-CM | POA: Diagnosis not present

## 2020-06-15 DIAGNOSIS — R278 Other lack of coordination: Secondary | ICD-10-CM | POA: Diagnosis not present

## 2020-06-15 DIAGNOSIS — M6389 Disorders of muscle in diseases classified elsewhere, multiple sites: Secondary | ICD-10-CM | POA: Diagnosis not present

## 2020-06-15 DIAGNOSIS — R2689 Other abnormalities of gait and mobility: Secondary | ICD-10-CM | POA: Diagnosis not present

## 2020-06-15 DIAGNOSIS — R2681 Unsteadiness on feet: Secondary | ICD-10-CM | POA: Diagnosis not present

## 2020-06-17 DIAGNOSIS — G301 Alzheimer's disease with late onset: Secondary | ICD-10-CM | POA: Diagnosis not present

## 2020-06-17 DIAGNOSIS — R296 Repeated falls: Secondary | ICD-10-CM | POA: Diagnosis not present

## 2020-06-17 DIAGNOSIS — R278 Other lack of coordination: Secondary | ICD-10-CM | POA: Diagnosis not present

## 2020-06-17 DIAGNOSIS — R2681 Unsteadiness on feet: Secondary | ICD-10-CM | POA: Diagnosis not present

## 2020-06-17 DIAGNOSIS — M6389 Disorders of muscle in diseases classified elsewhere, multiple sites: Secondary | ICD-10-CM | POA: Diagnosis not present

## 2020-06-17 DIAGNOSIS — R2689 Other abnormalities of gait and mobility: Secondary | ICD-10-CM | POA: Diagnosis not present

## 2020-06-18 DIAGNOSIS — G4733 Obstructive sleep apnea (adult) (pediatric): Secondary | ICD-10-CM | POA: Diagnosis not present

## 2020-06-22 DIAGNOSIS — R2689 Other abnormalities of gait and mobility: Secondary | ICD-10-CM | POA: Diagnosis not present

## 2020-06-22 DIAGNOSIS — M6389 Disorders of muscle in diseases classified elsewhere, multiple sites: Secondary | ICD-10-CM | POA: Diagnosis not present

## 2020-06-22 DIAGNOSIS — R2681 Unsteadiness on feet: Secondary | ICD-10-CM | POA: Diagnosis not present

## 2020-06-22 DIAGNOSIS — G301 Alzheimer's disease with late onset: Secondary | ICD-10-CM | POA: Diagnosis not present

## 2020-06-22 DIAGNOSIS — R296 Repeated falls: Secondary | ICD-10-CM | POA: Diagnosis not present

## 2020-06-22 DIAGNOSIS — R278 Other lack of coordination: Secondary | ICD-10-CM | POA: Diagnosis not present

## 2020-06-24 DIAGNOSIS — M6389 Disorders of muscle in diseases classified elsewhere, multiple sites: Secondary | ICD-10-CM | POA: Diagnosis not present

## 2020-06-24 DIAGNOSIS — R278 Other lack of coordination: Secondary | ICD-10-CM | POA: Diagnosis not present

## 2020-06-24 DIAGNOSIS — R296 Repeated falls: Secondary | ICD-10-CM | POA: Diagnosis not present

## 2020-06-24 DIAGNOSIS — R2689 Other abnormalities of gait and mobility: Secondary | ICD-10-CM | POA: Diagnosis not present

## 2020-06-24 DIAGNOSIS — R2681 Unsteadiness on feet: Secondary | ICD-10-CM | POA: Diagnosis not present

## 2020-06-24 DIAGNOSIS — G301 Alzheimer's disease with late onset: Secondary | ICD-10-CM | POA: Diagnosis not present

## 2020-07-04 DIAGNOSIS — F0280 Dementia in other diseases classified elsewhere without behavioral disturbance: Secondary | ICD-10-CM | POA: Diagnosis not present

## 2020-07-04 DIAGNOSIS — I161 Hypertensive emergency: Secondary | ICD-10-CM | POA: Diagnosis not present

## 2020-07-04 DIAGNOSIS — R5381 Other malaise: Secondary | ICD-10-CM | POA: Diagnosis not present

## 2020-07-04 DIAGNOSIS — R55 Syncope and collapse: Secondary | ICD-10-CM | POA: Diagnosis not present

## 2020-07-04 DIAGNOSIS — G309 Alzheimer's disease, unspecified: Secondary | ICD-10-CM | POA: Diagnosis not present

## 2020-07-04 DIAGNOSIS — E785 Hyperlipidemia, unspecified: Secondary | ICD-10-CM | POA: Diagnosis not present

## 2020-07-04 DIAGNOSIS — R1312 Dysphagia, oropharyngeal phase: Secondary | ICD-10-CM | POA: Diagnosis not present

## 2020-07-04 DIAGNOSIS — F0390 Unspecified dementia without behavioral disturbance: Secondary | ICD-10-CM | POA: Diagnosis not present

## 2020-07-04 DIAGNOSIS — S066X1A Traumatic subarachnoid hemorrhage with loss of consciousness of 30 minutes or less, initial encounter: Secondary | ICD-10-CM | POA: Diagnosis not present

## 2020-07-04 DIAGNOSIS — S0990XA Unspecified injury of head, initial encounter: Secondary | ICD-10-CM | POA: Diagnosis not present

## 2020-07-04 DIAGNOSIS — R9431 Abnormal electrocardiogram [ECG] [EKG]: Secondary | ICD-10-CM | POA: Diagnosis not present

## 2020-07-04 DIAGNOSIS — G4733 Obstructive sleep apnea (adult) (pediatric): Secondary | ICD-10-CM | POA: Diagnosis not present

## 2020-07-04 DIAGNOSIS — S065X1A Traumatic subdural hemorrhage with loss of consciousness of 30 minutes or less, initial encounter: Secondary | ICD-10-CM | POA: Diagnosis not present

## 2020-07-04 DIAGNOSIS — E871 Hypo-osmolality and hyponatremia: Secondary | ICD-10-CM | POA: Diagnosis not present

## 2020-07-04 DIAGNOSIS — G8911 Acute pain due to trauma: Secondary | ICD-10-CM | POA: Diagnosis not present

## 2020-07-04 DIAGNOSIS — I10 Essential (primary) hypertension: Secondary | ICD-10-CM | POA: Diagnosis not present

## 2020-07-04 DIAGNOSIS — Z9181 History of falling: Secondary | ICD-10-CM | POA: Diagnosis not present

## 2020-07-04 DIAGNOSIS — G935 Compression of brain: Secondary | ICD-10-CM | POA: Diagnosis not present

## 2020-07-04 DIAGNOSIS — I609 Nontraumatic subarachnoid hemorrhage, unspecified: Secondary | ICD-10-CM | POA: Diagnosis not present

## 2020-07-04 DIAGNOSIS — Z7982 Long term (current) use of aspirin: Secondary | ICD-10-CM | POA: Diagnosis not present

## 2020-07-04 DIAGNOSIS — W19XXXA Unspecified fall, initial encounter: Secondary | ICD-10-CM | POA: Diagnosis not present

## 2020-07-04 DIAGNOSIS — R0902 Hypoxemia: Secondary | ICD-10-CM | POA: Diagnosis not present

## 2020-07-04 DIAGNOSIS — S065X9A Traumatic subdural hemorrhage with loss of consciousness of unspecified duration, initial encounter: Secondary | ICD-10-CM | POA: Diagnosis not present

## 2020-07-04 DIAGNOSIS — I2510 Atherosclerotic heart disease of native coronary artery without angina pectoris: Secondary | ICD-10-CM | POA: Diagnosis not present

## 2020-07-04 DIAGNOSIS — N400 Enlarged prostate without lower urinary tract symptoms: Secondary | ICD-10-CM | POA: Diagnosis not present

## 2020-07-04 DIAGNOSIS — R1310 Dysphagia, unspecified: Secondary | ICD-10-CM | POA: Diagnosis not present

## 2020-07-04 DIAGNOSIS — S065X0A Traumatic subdural hemorrhage without loss of consciousness, initial encounter: Secondary | ICD-10-CM | POA: Diagnosis not present

## 2020-07-04 DIAGNOSIS — R4020 Unspecified coma: Secondary | ICD-10-CM | POA: Diagnosis not present

## 2020-07-09 DIAGNOSIS — G4733 Obstructive sleep apnea (adult) (pediatric): Secondary | ICD-10-CM | POA: Diagnosis not present

## 2020-07-09 DIAGNOSIS — Z9181 History of falling: Secondary | ICD-10-CM | POA: Diagnosis not present

## 2020-07-09 DIAGNOSIS — M6389 Disorders of muscle in diseases classified elsewhere, multiple sites: Secondary | ICD-10-CM | POA: Diagnosis not present

## 2020-07-09 DIAGNOSIS — R278 Other lack of coordination: Secondary | ICD-10-CM | POA: Diagnosis not present

## 2020-07-09 DIAGNOSIS — R2689 Other abnormalities of gait and mobility: Secondary | ICD-10-CM | POA: Diagnosis not present

## 2020-07-09 DIAGNOSIS — S065X1A Traumatic subdural hemorrhage with loss of consciousness of 30 minutes or less, initial encounter: Secondary | ICD-10-CM | POA: Diagnosis not present

## 2020-07-13 ENCOUNTER — Encounter: Payer: Self-pay | Admitting: Neurology

## 2020-07-13 ENCOUNTER — Ambulatory Visit: Payer: PPO | Admitting: Neurology

## 2020-07-13 VITALS — BP 127/79 | HR 68 | Ht 71.0 in | Wt 176.0 lb

## 2020-07-13 DIAGNOSIS — R278 Other lack of coordination: Secondary | ICD-10-CM | POA: Diagnosis not present

## 2020-07-13 DIAGNOSIS — S065X9A Traumatic subdural hemorrhage with loss of consciousness of unspecified duration, initial encounter: Secondary | ICD-10-CM | POA: Diagnosis not present

## 2020-07-13 DIAGNOSIS — Z9181 History of falling: Secondary | ICD-10-CM | POA: Diagnosis not present

## 2020-07-13 DIAGNOSIS — G3184 Mild cognitive impairment, so stated: Secondary | ICD-10-CM

## 2020-07-13 DIAGNOSIS — I609 Nontraumatic subarachnoid hemorrhage, unspecified: Secondary | ICD-10-CM

## 2020-07-13 DIAGNOSIS — R2689 Other abnormalities of gait and mobility: Secondary | ICD-10-CM | POA: Diagnosis not present

## 2020-07-13 DIAGNOSIS — S065X1A Traumatic subdural hemorrhage with loss of consciousness of 30 minutes or less, initial encounter: Secondary | ICD-10-CM | POA: Diagnosis not present

## 2020-07-13 DIAGNOSIS — S065XAA Traumatic subdural hemorrhage with loss of consciousness status unknown, initial encounter: Secondary | ICD-10-CM

## 2020-07-13 DIAGNOSIS — M6389 Disorders of muscle in diseases classified elsewhere, multiple sites: Secondary | ICD-10-CM | POA: Diagnosis not present

## 2020-07-13 NOTE — Progress Notes (Signed)
SLEEP MEDICINE CLINIC    Provider:  Larey Seat, MD  Primary Care Physician:  Jeffrey Infante, MD [place] [address]     Referring Provider: Dr. Adrian Prows, MD  / Jeffrey Infante, MD       Chief Complaint according to patient   Patient presents with:    . New Patient (Initial Visit)     ED follow up for SDH.        HISTORY OF PRESENT ILLNESS:   Jeffrey [name] is a 79 year old Caucasian gentleman with a history of progressive dementia and sleep apnea who initially had a good response to CPAP use.  He did develop more amnestic spells and also more frequent falls.  He had 12 falls since April of this year.  He had a SAH/SDH 7 days ago ! 07-04-2020  He was by his report trying to open the blinds in his bedroom and the next thing his wife heard was a thump, she found him unconscious on the floor and he had fallen to the left side to the left side.  EMS was called the patient went to the [name] emergency room at CT scan there showed subdural hematoma.  The patient underwent observational stay in the ICU but did not require surgery or drain.  He was asked to hold aspirin until 07-20-2020. His [name] course was summarized by a colleague who was stated that he had an acute traumatic subdural arachnoid hemorrhage and subdural hemorrhage the work-up was completed by CT it was an extra-axial hemorrhage 24-hour head CT second day showed already slight improvement in the volume.  His antiplatelet agents were held there was no other therapy necessary.   compression stockings were used for DVT prophylaxis, he presented with a leukocytosis which resolved he presented hyponatremic 130s which also resolved, and he was assessed by rehabilitation was a recommendation for PT OT and speech they were consulted and followed him during the [name] observation and he was evaluated for rehabilitation needs but remained home and discharged home with supervision.  Home health PT asked for a shower  chair.   The patients underlying alzheimer's diagnosis may have progressed with this traumatic bleed.   Montreal Cognitive Assessment  07/13/2020  Visuospatial/ Executive (0/5) 2  Naming (0/3) 3  Attention: Read list of digits (0/2) 2  Attention: Read list of letters (0/1) 1  Attention: Serial 7 subtraction starting at 100 (0/3) 3  Language: Repeat phrase (0/2) 2  Language : Fluency (0/1) 0  Abstraction (0/2) 2  Delayed Recall (0/5) 0  Orientation (0/6) 6  Total 21   Montreal Cognitive Assessment Blind 07/13/2020  Attention: Read list of digits (0/2) 2  Attention: Read list of letters (0/1) 1  Attention: Serial 7 subtraction starting at 100 (0/3) 3  Language: Repeat phrase (0/2) 2  Language : Fluency (0/1) 0  Abstraction (0/2) 2  Delayed Recall (0/5) 0  Orientation (0/6) 6  Total 16    Jeffrey [name], M.D.   RV on 05-19-2020.  Jeffrey [name] is a 79 y.o. year old Caucasian male patient with dementia and OSA-, with good response to CPAP treatment in the setting of OSA- He is bored and that , he feels, lets him to sleep more.  Sleep apnea identified in the study from 3-24 2021 by home sleep test revealed severe obstructive sleep apnea with an AHI of 32.6 RDI was 35.8 indicating some additional snoring.  In rem sleep there was a slightly higher  degree of apnea AHI was 39.4.  The patient started on AutoSet pressure therapy he has been very compliant 100% of the last 30 days he has used the machine and each night over 4 hours consecutively.  The average user time is 7 hours 50 minutes.  The AutoSet machine offers a pressure range between 5 and 16 cmH2O with 3 cm EPR and his residual AHI is 2.5/h now he does have some air leaks but they seem not to affect the apnea control and the 95th percentile pressure is 12 cmH2O well within the current settings so I do not have to change any.  Of the remaining low apnea count 1.8 as central in nature and 0.1 obstructive.  I would like for the patient  to continue with this therapy.  His fatigue score was endorsed at 9 and his Epworth sleepiness score at 1 point.  And the patient has changed to Exelon po now increased to 3 mg bid.     Following up today on 02/19/2020. His PSG did not give any answers as he couldn't sleep. He underwent HST and has been given a diagnosis pf sevre OSA, with bradycardia and AHI of 32/h. CPAP is needed . We discussed in detail the potential benefits of OSA treatment for bradycardia and hopefully benefits for his underlying dementia. Due to his facial structures I requested and evaluation face- to -face.     12-19-2019. I will perform a MOCA The problem of recurrent insomnia is discussed.I reviewed his 2 cardiac monitor test results from [name] office.  One episode of bradycardia, one of tachycardia- both reportedly asymptomatic.  He is awaiting sleep study with full EEG on 12-29-2019.   We will do MOCA today- and discuss interval history- Jeffrey [name] is denying any trouble. His wife gives additional information. He fell at night over the toilet, he could not walk and stand steady the following day - he bumped into the left - and was " a little spacey  His BP was all over the place. And this morning he had 94/ 68 mmHg on the left, and the right sided BP was also low at  109/ 73 mmHg. He is at high fall risk, and he has cognitive impairment, too. MMSE was 28/ 30, but is not detecting differential difficulties, such as visio-spatial problems.  MOCA revealed some word delay, he missed all 5 recall words and he was not able to abstract as well-.  MOCA was 24/ 30 points. He has a delayed reaction time- he shall no longer drive.    Seen here upon referral by cardiology on  07/13/2020.  We will get the first and be correct.  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  Which included scapular skin flap closure.   Removal of the parotid gland the TMJ joint the lymph nodes C-section branches of the trigeminal nerve were involved and damaged surgery was followed by radiation treatments he was followed by Jeffrey. Caryl Never at [name].  In 2019 he had knee pain and some balance issues he did physical therapy and received 2 steroid shots to the knee.  In 2018 he had an obstructed tear ducts that needed to be repaired deviated septum and a lift of the area under the outer right eye.  This was again done by Jeffrey. Janeal Holmes.  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  In 2020 July 21 he had a fall at [facility] hit his head had a facial laceration and felt dizzy and weak could not walk followed by an ER visit at [facility] CT negative there was no contusion laceration was treated.  In October again feeling of dizziness and weakness while walking in the morning sat down did not fall saw the nurse practitioner at Jeffrey. Tempie Donning office who started a referral to cardiology.  He has a history of hypertension, has a history of mild dementia early stages, cardiology diagnosed him with carotid artery stenosis but also kidneys chronic kidney disease stage II, hyperlipidemia, you recently had these 2 with syncope or near syncopal episodes so he underwent a Lexiscan nuclear stress test which showed only a small area of ischemia in the inner apical wall but it was still felt to be a low risk study.  Echocardiogram was essentially normal carotid duplex showed left external carotid over 50% stenosis otherwise mild this would not be affecting his brain function.  He was placed on a 2-week cardiac monitor there were no arrhythmias but he did have bradycardia and supraventricular tachycardia.  Blood pressures have reportedly been stable.   Chief concern according to patient :  " I am so sleepy"     I have the pleasure of seeing Jeffrey [name] today, a right-handed Caucasian male  for evaluation of a possible sleep disorder.      Family medical /sleep history: no other family member on CPAP with OSA, insomnia, sleep walking.    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 Daughter is a PA with [employer].  Pets are not present. Tobacco use: quit 54 years ago- in the [employer]. [place].   ETOH use none , Caffeine intake in form of Coffee( 2 cups in AM) Soda( none) Tea ( caffeinated at dinner ) or energy drinks.Regular exercise in form of - walking   Hobbies : none    Sleep habits are as follows: The patient's dinner time is between 5-6.30 PM. The patient goes to bed at 10 PM and continues to sleep for 7-8 hours, wakes for 3-4  bathroom breaks, the first time at 1 AM.   The preferred sleep position is supine and side, with the support of 2 pillows.  Dreams are reportedly rare. He belches a lot o at night.    7.30  AM is the usual rise time. The patient wakes up spontaneously/.  He reports  feeling refreshed or restored in AM, but he naps quickly- 9.30-10 and needs to be woken-  Lunch is about 12.30 and after lunch he naps again- 45 minutes, and I later afternoon again.  Total daily sleep time over 12 hours.  They moved to [facility] 08-[place] when his memory became impaired..    Review of Systems: Out of a complete 14 system review, the patient complains of only the following symptoms, and all other reviewed systems are negative.:  Fatigue, sleepiness , snoring, fragmented sleep, hypertension.    How likely are you to doze in the following situations: 0 = not likely, 1 = slight chance, 2 = moderate chance, 3 = high chance   Sitting and Reading? Watching Television? Sitting inactive in a public place (theater or meeting)? As a passenger in a car for an  hour without a break? Lying down in the afternoon when circumstances  permit? Sitting and talking to someone? Sitting quietly after lunch without alcohol? In a car, while stopped for a few minutes in traffic?   Total = 3/ 24 points . Wife would tend to 16 points.   FSS endorsed at 9/ 63 points.   Social History   Socioeconomic History  . Marital status: Married    Spouse name: Not on file  . Number of children: 2  . Years of education: Not on file  . Highest education level: Not on file  Occupational History  . Occupation: retired  Tobacco Use  . Smoking status: Former Smoker    Packs/day: 0.25    Years: 3.00    Pack years: 0.75    Quit date: 08/21/1966    Years since quitting: 53.9  . Smokeless tobacco: Never Used  Vaping Use  . Vaping Use: Never used  Substance and Sexual Activity  . Alcohol use: Yes    Comment: limited  . Drug use: No  . Sexual activity: Yes  Other Topics Concern  . Not on file  Social History Narrative  . Not on file   Social Determinants of Health   Financial Resource Strain:   . Difficulty of Paying Living Expenses: Not on file  Food Insecurity:   . Worried About Charity fundraiser in the Last Year: Not on file  . Ran Out of Food in the Last Year: Not on file  Transportation Needs:   . Lack of Transportation (Medical): Not on file  . Lack of Transportation (Non-Medical): Not on file  Physical Activity:   . Days of Exercise per Week: Not on file  . Minutes of Exercise per Session: Not on file  Stress:   . Feeling of Stress : Not on file  Social Connections:   . Frequency of Communication with Friends and Family: Not on file  . Frequency of Social Gatherings with Friends and Family: Not on file  . Attends Religious Services: Not on file  . Active Member of Clubs or Organizations: Not on file  . Attends Archivist Meetings: Not on file  . Marital Status: Not on file    Family History  Problem Relation Age of Onset  . Heart disease Father   . Heart attack Father   . Alzheimer's disease  Mother   . Heart failure Brother   . Colon cancer Neg Hx   . Stomach cancer Neg Hx   . Esophageal cancer Neg Hx   . Rectal cancer Neg Hx   . Liver cancer Neg Hx     Past Medical History:  Diagnosis Date  . Colon polyps   . Diverticulosis   . Hypercholesterolemia   . Hypertension   . Internal hemorrhoids     Past Surgical History:  Procedure Laterality Date  . EYE SURGERY     X 4  . HERNIA REPAIR  2006   abdominal  . SQUAMOUS CELL CARCINOMA EXCISION     preauricular on the right  . TONSILLECTOMY  1947  . VASECTOMY  1981     Current Outpatient Medications on File Prior to Visit  Medication Sig Dispense Refill  . atorvastatin (LIPITOR) 20 MG tablet Take 20 mg by mouth daily at 6 PM.     . citalopram (CELEXA) 10 MG tablet Take 10 mg by mouth daily.    . Multiple Vitamin (MULTIVITAMIN) tablet Take 1 tablet by mouth daily.    Marland Kitchen  niacinamide 500 MG tablet Take 500 mg by mouth 2 (two) times daily with a meal.    . ramipril (ALTACE) 2.5 MG capsule Take 2.5 mg by mouth daily.    . rivastigmine (EXELON) 3 MG capsule Take 3 mg by mouth in the morning and at bedtime.    . ASPIRIN 81 PO Take by mouth. (Patient not taking: Reported on 07/13/2020)     No current facility-administered medications on file prior to visit.    Allergies  Allergen Reactions  . Other Other (See Comments)    Vicryl sutures, pte. Doesn't know what reaction he had, "Drs. Owens Shark + Yeatts deetrmined not to use it on me"    Physical exam:  Today's Vitals   07/13/20 1445  BP: 127/79  Pulse: 68  Weight: 176 lb (79.8 kg)  Height: 5\' 11"  (1.803 m)   Body mass index is 24.55 kg/m.   Wt Readings from Last 3 Encounters:  07/13/20 176 lb (79.8 kg)  05/19/20 182 lb (82.6 kg)  02/19/20 181 lb (82.1 kg)     Ht Readings from Last 3 Encounters:  07/13/20 5\' 11"  (1.803 m)  05/19/20 6' (1.829 m)  02/19/20 6' (1.829 m)      General: The patient is awake, alert and appears not in acute distress. The patient  is well groomed. Head: Normocephalic, atraumatic. Neck is supple. Mallampati ,  neck circumference:15 inches . Nasal airflow  patent.   Dental status:  Cardiovascular:  Regular rate and cardiac rhythm by pulse,  without distended neck veins. Respiratory: Lungs are clear to auscultation.  Skin:  Without evidence of ankle edema, or rash. Trunk: The patient's posture is erect.   Neurologic exam : The patient is awake and alert, oriented to place and time.    MMSE - Mini Mental State Exam 12/19/2019  Orientation to time 5  Orientation to Place 4  Registration 3  Attention/ Calculation 5  Recall 3  Language- name 2 objects 2  Language- repeat 1  Language- follow 3 step command 3  Language- read & follow direction 1  Write a sentence 1  Copy design 1  Total score 29     Memory subjective described as impaired. Attention span & concentration ability appears normal. He speaks very louldly and slowly- measured.  Speech is fluent,  without dysarthria, dysphonia or aphasia.  He has hearing aids.   Mood and affect are appropriate.   Cranial nerves: no loss of smell or taste reported  Right eye - natural pupil- ptosis, after nasal septum repaired. Funduscopic exam deferred   Extraocular movements in vertical and horizontal planes were full  and without nystagmus.  No Diplopia. Right ptosis is related to scar surgical formation-  Visual fields by finger perimetry are intact. Hearing impaired - has hearing aids in situ.  Facial sensation to fine touch is impaired in the left face- radiation and surgery.  Facial motor strength is asymmetric  - the tongue  moved midline.  Uvula was deviated to the left, appears to stick to the pillar. .  Neck ROM : had neck resection -  rotation, tilt and flexion extension were normal for age and shoulder shrug was symmetrical.    Motor exam:  Symmetric bulk, tone and ROM.   Normal tone without cog wheeling, symmetric grip strength .   Sensory:  Fine  touch, pinprick and vibration were normal.  Proprioception tested in the upper extremities was normal.   Coordination: Rapid alternating movements in the fingers/hands were  deferred.  The Finger-to-nose maneuver was intact without evidence of ataxia, dysmetria or tremor.   Gait and station: Patient could rise unassisted from a seated position, walked without assistive device.  Stance is of normal width/ base .  Toe and heel walk were deferred.  Deep tendon reflexes: in the upper and lower extremities are symmetric and intact.  Babinski response was deferred.       After spending a total time of 30 minutes face to face and additional time for physical and neurologic examination, review of laboratory studies, ED reports and images at [name].  personal review of imaging studies, reports and results of other testing and review of referral information / records as far as provided in visit, I have established the following assessments:  1) Alzheimer's type  dementia, just borderline to MCI- MOCA was 24/ 30 last visit now 21/ 30 . Stay on [place] . No driving.   2) falls - WHY- ? We worked him up for bradycardia, we looked at orthopedics, at neuropathy-  He walks very stiffly, he turns with 5 steps, he is not wide based.  He uses a cane , 4 prongs.     3) we did not address his severe OSA ! Attended sleep study full EEG had failed - due to Insomnia- and HST was needed. He was clearly affected. Severe OSA confirmed and treated on auto CPAP with a very good resolution, some residual centrals- not bad- and best mask fit for his special needs.    In short, Jeffrey [name] is presenting with  Alzheimer's type dementia and status post SAH/ SDH , traumatic , on 07-05-220   I plan to follow up either personally or through our NP within 2-3 month.    Electronically signed by: Jeffrey Seat, MD 07/13/2020 3:18 PM  [facility] Board certified by The [employer] of Sleep Medicine and Diplomate of the [employer] of Sleep Medicine. Board certified In Neurology through the [place], Fellow of the [employer] of Neurology. Medical Director of [employer].

## 2020-07-13 NOTE — Patient Instructions (Signed)
Living With Traumatic Brain Injury Traumatic brain injury (TBI) is an injury to the brain that may be mild, moderate, or severe. Symptoms of any type of TBI can be long lasting (chronic). Depending on the area of the brain that is affected, a TBI can interfere with vision, memory, concentration, speech, balance, sense of touch, and sleep. TBI can also cause chronic symptoms like headache or dizziness. How to cope with lifestyle changes After a TBI, you may need to make changes to your lifestyle in order to recover as well as possible. How quickly and how fully you recover will depend on the severity of your injury. Your recovery plan may involve:  Working with specialists to develop a rehabilitation plan to help you return to your regular activities. Your health care team may include: ? Physical or occupational therapists. ? Speech and language pathologists. ? Mental health counselors. ? Physicians like your primary care physician or neurologist.  Taking time off work or school, depending on your injury.  Avoiding situations where there is a risk for another head injury, such as football, hockey, soccer, basketball, martial arts, downhill snow sports, and horseback riding. Do not do these activities until your health care provider approves.  Resting. Rest helps the brain to heal. Make sure you: ? Get plenty of sleep at night. Avoid staying up late at night. ? Keep the same bedtime hours on weekends and weekdays. ? Rest during the day. Take daytime naps or rest breaks when you feel tired.  Avoiding extra stress on your eyes. You may need to set time limits when working on the computer, watching TV, and reading.  Finding ways to manage stress. This may include: ? Avoiding activities that cause stress. ? Deep breathing, yoga, or meditation. ? Listening to music or spending time outdoors.  Making lists, setting reminders, or using a day planner to help your memory.  Allowing yourself plenty  of time to complete everyday tasks, such as grocery shopping, paying bills, and doing laundry.  Avoiding driving. Your ability to drive safely may be affected by your injury. ? Rely on family, friends, or a transportation service to help you get around and to appointments. ? Have a professional evaluation to check your driving ability. ? Access support services to help you return to driving. These may include training and adaptive equipment. Follow these instructions at home:  Take over-the-counter and prescription medicines only as told by your health care provider. Do not take aspirin or other anti-inflammatory medicines such as ibuprofen or naproxen unless approved by your health care provider.  Avoid large amounts of caffeine. Your body may be more sensitive to it after your injury.  Do not use any products that contain nicotine or tobacco, such as cigarettes, e-cigarettes, nicotine gum, and patches. If you need help quitting, ask your health care provider.  Do not use drugs.  Limit alcohol intake to no more than 1 drink per day for nonpregnant women and 2 drinks per day for men. One drink equals 12 ounces of beer, 5 ounces of wine, or 1 ounces of hard liquor.  Do not drive until cleared by your health care provider.  Keep all follow-up visits as told by your health care provider. This is important. Where to find support  Talk with your employer, co-workers, teachers, or school counselor about your injury. Work together to develop a plan for completing tasks while you recover.  Talk to others living with a TBI. Join a support group with other people  who have experienced a TBI.  Let your friends and family members know what they can do to help. This might include helping at home or transportation to appointments.  If you are unable to continue working after your injury, talk to a Education officer, museum about options to help you meet your financial needs.  Seek out additional resources if  you are a Teacher, adult education or family member, such as: ? Defense and [place]: dvbic.dcoe.mil ? Department of [place]: (916)241-3431 Questions to ask your health care provider:  How serious is my injury?  What is my rehabilitation plan?  What is my expected recovery?  When can I return to work or school?  When can I return to regular activities, including driving? Contact a health care provider if:  You have new or worsening: ? Dizziness. ? Headache. ? Anxiety or depression. ? Irritability. ? Confusion. ? Jerky movements that you cannot control (seizures). ? Extreme sensitivity to light or sound. ? Nausea or vomiting. Summary  Traumatic brain injury (TBI) is an injury to your brain that can interfere with vision, memory, concentration, speech, balance, sense of touch, and sleep. TBI can also cause chronic symptoms like headache or dizziness.  After a TBI you may need to make several changes to your lifestyle in order to recover as well as possible. How quickly and how fully you recover will depend on the severity of your injury.  Talk to your family, friends, employer, co-workers, Tour manager, or school counselor about your injury. Work together to develop a plan for completing tasks while you recover. This information is not intended to replace advice given to you by your health care provider. Make sure you discuss any questions you have with your health care provider. Document Revised: 02/21/2019 Document Reviewed: 10/26/2016 Elsevier Patient Education  [place].

## 2020-07-14 ENCOUNTER — Telehealth: Payer: Self-pay | Admitting: Neurology

## 2020-07-14 NOTE — Telephone Encounter (Signed)
Health team order sent to GI. No auth they will reach out to the patient to schedule.  

## 2020-07-16 DIAGNOSIS — Z9181 History of falling: Secondary | ICD-10-CM | POA: Diagnosis not present

## 2020-07-16 DIAGNOSIS — I6529 Occlusion and stenosis of unspecified carotid artery: Secondary | ICD-10-CM | POA: Diagnosis not present

## 2020-07-16 DIAGNOSIS — M6389 Disorders of muscle in diseases classified elsewhere, multiple sites: Secondary | ICD-10-CM | POA: Diagnosis not present

## 2020-07-16 DIAGNOSIS — R2689 Other abnormalities of gait and mobility: Secondary | ICD-10-CM | POA: Diagnosis not present

## 2020-07-16 DIAGNOSIS — G300 Alzheimer's disease with early onset: Secondary | ICD-10-CM | POA: Diagnosis not present

## 2020-07-16 DIAGNOSIS — S065X1A Traumatic subdural hemorrhage with loss of consciousness of 30 minutes or less, initial encounter: Secondary | ICD-10-CM | POA: Diagnosis not present

## 2020-07-16 DIAGNOSIS — R278 Other lack of coordination: Secondary | ICD-10-CM | POA: Diagnosis not present

## 2020-07-16 DIAGNOSIS — S065X9A Traumatic subdural hemorrhage with loss of consciousness of unspecified duration, initial encounter: Secondary | ICD-10-CM | POA: Diagnosis not present

## 2020-07-16 DIAGNOSIS — I2510 Atherosclerotic heart disease of native coronary artery without angina pectoris: Secondary | ICD-10-CM | POA: Diagnosis not present

## 2020-07-16 DIAGNOSIS — I10 Essential (primary) hypertension: Secondary | ICD-10-CM | POA: Diagnosis not present

## 2020-07-20 ENCOUNTER — Encounter: Payer: Self-pay | Admitting: Neurology

## 2020-07-20 ENCOUNTER — Ambulatory Visit
Admission: RE | Admit: 2020-07-20 | Discharge: 2020-07-20 | Disposition: A | Discharge status: Home / Self Care | Payer: PPO | Source: Ambulatory Visit | Attending: Neurology | Admitting: Neurology

## 2020-07-20 ENCOUNTER — Other Ambulatory Visit: Payer: Self-pay

## 2020-07-20 DIAGNOSIS — G3184 Mild cognitive impairment, so stated: Secondary | ICD-10-CM

## 2020-07-20 DIAGNOSIS — S065XAA Traumatic subdural hemorrhage with loss of consciousness status unknown, initial encounter: Secondary | ICD-10-CM

## 2020-07-20 DIAGNOSIS — I609 Nontraumatic subarachnoid hemorrhage, unspecified: Secondary | ICD-10-CM

## 2020-07-20 DIAGNOSIS — S065X9A Traumatic subdural hemorrhage with loss of consciousness of unspecified duration, initial encounter: Secondary | ICD-10-CM

## 2020-07-20 MED ORDER — GADOBENATE DIMEGLUMINE 529 MG/ML IV SOLN
15.0000 mL | Freq: Once | INTRAVENOUS | Status: AC | PRN
  Administered 2020-07-20: 15 mL via INTRAVENOUS

## 2020-07-22 NOTE — Progress Notes (Signed)
Patent with post traumatic frontal SDH, SAH admitted to hospital 12 days ago - bleed now appears to have completely resolved !

## 2020-07-27 DIAGNOSIS — S065X1A Traumatic subdural hemorrhage with loss of consciousness of 30 minutes or less, initial encounter: Secondary | ICD-10-CM | POA: Diagnosis not present

## 2020-07-27 DIAGNOSIS — M6389 Disorders of muscle in diseases classified elsewhere, multiple sites: Secondary | ICD-10-CM | POA: Diagnosis not present

## 2020-07-27 DIAGNOSIS — Z9181 History of falling: Secondary | ICD-10-CM | POA: Diagnosis not present

## 2020-07-27 DIAGNOSIS — R2689 Other abnormalities of gait and mobility: Secondary | ICD-10-CM | POA: Diagnosis not present

## 2020-07-27 DIAGNOSIS — R278 Other lack of coordination: Secondary | ICD-10-CM | POA: Diagnosis not present

## 2020-07-29 DIAGNOSIS — R296 Repeated falls: Secondary | ICD-10-CM | POA: Diagnosis not present

## 2020-07-29 DIAGNOSIS — M6389 Disorders of muscle in diseases classified elsewhere, multiple sites: Secondary | ICD-10-CM | POA: Diagnosis not present

## 2020-07-29 DIAGNOSIS — R2689 Other abnormalities of gait and mobility: Secondary | ICD-10-CM | POA: Diagnosis not present

## 2020-07-29 DIAGNOSIS — F0280 Dementia in other diseases classified elsewhere without behavioral disturbance: Secondary | ICD-10-CM | POA: Diagnosis not present

## 2020-07-29 DIAGNOSIS — G301 Alzheimer's disease with late onset: Secondary | ICD-10-CM | POA: Diagnosis not present

## 2020-07-29 DIAGNOSIS — S065X1A Traumatic subdural hemorrhage with loss of consciousness of 30 minutes or less, initial encounter: Secondary | ICD-10-CM | POA: Diagnosis not present

## 2020-07-29 DIAGNOSIS — Z9181 History of falling: Secondary | ICD-10-CM | POA: Diagnosis not present

## 2020-07-29 DIAGNOSIS — G4733 Obstructive sleep apnea (adult) (pediatric): Secondary | ICD-10-CM | POA: Diagnosis not present

## 2020-07-29 DIAGNOSIS — R278 Other lack of coordination: Secondary | ICD-10-CM | POA: Diagnosis not present

## 2020-08-03 DIAGNOSIS — R2689 Other abnormalities of gait and mobility: Secondary | ICD-10-CM | POA: Diagnosis not present

## 2020-08-03 DIAGNOSIS — R278 Other lack of coordination: Secondary | ICD-10-CM | POA: Diagnosis not present

## 2020-08-03 DIAGNOSIS — S065X1A Traumatic subdural hemorrhage with loss of consciousness of 30 minutes or less, initial encounter: Secondary | ICD-10-CM | POA: Diagnosis not present

## 2020-08-03 DIAGNOSIS — M6389 Disorders of muscle in diseases classified elsewhere, multiple sites: Secondary | ICD-10-CM | POA: Diagnosis not present

## 2020-08-03 DIAGNOSIS — Z9181 History of falling: Secondary | ICD-10-CM | POA: Diagnosis not present

## 2020-08-05 DIAGNOSIS — M6389 Disorders of muscle in diseases classified elsewhere, multiple sites: Secondary | ICD-10-CM | POA: Diagnosis not present

## 2020-08-05 DIAGNOSIS — Z9181 History of falling: Secondary | ICD-10-CM | POA: Diagnosis not present

## 2020-08-05 DIAGNOSIS — R2689 Other abnormalities of gait and mobility: Secondary | ICD-10-CM | POA: Diagnosis not present

## 2020-08-05 DIAGNOSIS — S065X1A Traumatic subdural hemorrhage with loss of consciousness of 30 minutes or less, initial encounter: Secondary | ICD-10-CM | POA: Diagnosis not present

## 2020-08-05 DIAGNOSIS — R278 Other lack of coordination: Secondary | ICD-10-CM | POA: Diagnosis not present

## 2020-08-09 DIAGNOSIS — G4733 Obstructive sleep apnea (adult) (pediatric): Secondary | ICD-10-CM | POA: Diagnosis not present

## 2020-08-09 DIAGNOSIS — H524 Presbyopia: Secondary | ICD-10-CM | POA: Diagnosis not present

## 2020-08-09 DIAGNOSIS — H52202 Unspecified astigmatism, left eye: Secondary | ICD-10-CM | POA: Diagnosis not present

## 2020-08-09 DIAGNOSIS — E119 Type 2 diabetes mellitus without complications: Secondary | ICD-10-CM | POA: Diagnosis not present

## 2020-08-09 DIAGNOSIS — Z961 Presence of intraocular lens: Secondary | ICD-10-CM | POA: Diagnosis not present

## 2020-08-10 DIAGNOSIS — Z9181 History of falling: Secondary | ICD-10-CM | POA: Diagnosis not present

## 2020-08-10 DIAGNOSIS — S065X1A Traumatic subdural hemorrhage with loss of consciousness of 30 minutes or less, initial encounter: Secondary | ICD-10-CM | POA: Diagnosis not present

## 2020-08-10 DIAGNOSIS — R278 Other lack of coordination: Secondary | ICD-10-CM | POA: Diagnosis not present

## 2020-08-10 DIAGNOSIS — R2689 Other abnormalities of gait and mobility: Secondary | ICD-10-CM | POA: Diagnosis not present

## 2020-08-10 DIAGNOSIS — M6389 Disorders of muscle in diseases classified elsewhere, multiple sites: Secondary | ICD-10-CM | POA: Diagnosis not present

## 2020-08-12 DIAGNOSIS — R2689 Other abnormalities of gait and mobility: Secondary | ICD-10-CM | POA: Diagnosis not present

## 2020-08-12 DIAGNOSIS — R278 Other lack of coordination: Secondary | ICD-10-CM | POA: Diagnosis not present

## 2020-08-12 DIAGNOSIS — M6389 Disorders of muscle in diseases classified elsewhere, multiple sites: Secondary | ICD-10-CM | POA: Diagnosis not present

## 2020-08-12 DIAGNOSIS — Z9181 History of falling: Secondary | ICD-10-CM | POA: Diagnosis not present

## 2020-08-12 DIAGNOSIS — S065X1A Traumatic subdural hemorrhage with loss of consciousness of 30 minutes or less, initial encounter: Secondary | ICD-10-CM | POA: Diagnosis not present

## 2020-08-16 DIAGNOSIS — M859 Disorder of bone density and structure, unspecified: Secondary | ICD-10-CM | POA: Diagnosis not present

## 2020-08-16 DIAGNOSIS — G4733 Obstructive sleep apnea (adult) (pediatric): Secondary | ICD-10-CM | POA: Diagnosis not present

## 2020-08-16 DIAGNOSIS — I10 Essential (primary) hypertension: Secondary | ICD-10-CM | POA: Diagnosis not present

## 2020-08-16 DIAGNOSIS — Z23 Encounter for immunization: Secondary | ICD-10-CM | POA: Diagnosis not present

## 2020-08-16 DIAGNOSIS — I6389 Other cerebral infarction: Secondary | ICD-10-CM | POA: Diagnosis not present

## 2020-08-16 DIAGNOSIS — G300 Alzheimer's disease with early onset: Secondary | ICD-10-CM | POA: Diagnosis not present

## 2020-08-16 DIAGNOSIS — I351 Nonrheumatic aortic (valve) insufficiency: Secondary | ICD-10-CM | POA: Diagnosis not present

## 2020-08-16 DIAGNOSIS — R7301 Impaired fasting glucose: Secondary | ICD-10-CM | POA: Diagnosis not present

## 2020-08-16 DIAGNOSIS — N401 Enlarged prostate with lower urinary tract symptoms: Secondary | ICD-10-CM | POA: Diagnosis not present

## 2020-08-16 DIAGNOSIS — I2510 Atherosclerotic heart disease of native coronary artery without angina pectoris: Secondary | ICD-10-CM | POA: Diagnosis not present

## 2020-08-17 DIAGNOSIS — Z9181 History of falling: Secondary | ICD-10-CM | POA: Diagnosis not present

## 2020-08-17 DIAGNOSIS — R278 Other lack of coordination: Secondary | ICD-10-CM | POA: Diagnosis not present

## 2020-08-17 DIAGNOSIS — R2689 Other abnormalities of gait and mobility: Secondary | ICD-10-CM | POA: Diagnosis not present

## 2020-08-17 DIAGNOSIS — M6389 Disorders of muscle in diseases classified elsewhere, multiple sites: Secondary | ICD-10-CM | POA: Diagnosis not present

## 2020-08-17 DIAGNOSIS — S065X1A Traumatic subdural hemorrhage with loss of consciousness of 30 minutes or less, initial encounter: Secondary | ICD-10-CM | POA: Diagnosis not present

## 2020-08-19 DIAGNOSIS — R278 Other lack of coordination: Secondary | ICD-10-CM | POA: Diagnosis not present

## 2020-08-19 DIAGNOSIS — M6389 Disorders of muscle in diseases classified elsewhere, multiple sites: Secondary | ICD-10-CM | POA: Diagnosis not present

## 2020-08-19 DIAGNOSIS — R2689 Other abnormalities of gait and mobility: Secondary | ICD-10-CM | POA: Diagnosis not present

## 2020-08-19 DIAGNOSIS — S065X1A Traumatic subdural hemorrhage with loss of consciousness of 30 minutes or less, initial encounter: Secondary | ICD-10-CM | POA: Diagnosis not present

## 2020-08-19 DIAGNOSIS — Z9181 History of falling: Secondary | ICD-10-CM | POA: Diagnosis not present

## 2020-08-26 ENCOUNTER — Other Ambulatory Visit: Payer: Self-pay | Admitting: Internal Medicine

## 2020-08-26 DIAGNOSIS — M8580 Other specified disorders of bone density and structure, unspecified site: Secondary | ICD-10-CM

## 2020-08-30 ENCOUNTER — Other Ambulatory Visit: Payer: PPO

## 2020-09-02 DIAGNOSIS — R278 Other lack of coordination: Secondary | ICD-10-CM | POA: Diagnosis not present

## 2020-09-02 DIAGNOSIS — R2689 Other abnormalities of gait and mobility: Secondary | ICD-10-CM | POA: Diagnosis not present

## 2020-09-02 DIAGNOSIS — Z9181 History of falling: Secondary | ICD-10-CM | POA: Diagnosis not present

## 2020-09-02 DIAGNOSIS — S065X1A Traumatic subdural hemorrhage with loss of consciousness of 30 minutes or less, initial encounter: Secondary | ICD-10-CM | POA: Diagnosis not present

## 2020-09-02 DIAGNOSIS — M6389 Disorders of muscle in diseases classified elsewhere, multiple sites: Secondary | ICD-10-CM | POA: Diagnosis not present

## 2020-09-03 DIAGNOSIS — R413 Other amnesia: Secondary | ICD-10-CM | POA: Diagnosis not present

## 2020-09-03 DIAGNOSIS — G301 Alzheimer's disease with late onset: Secondary | ICD-10-CM | POA: Diagnosis not present

## 2020-09-03 DIAGNOSIS — Z7189 Other specified counseling: Secondary | ICD-10-CM | POA: Diagnosis not present

## 2020-09-03 DIAGNOSIS — F0280 Dementia in other diseases classified elsewhere without behavioral disturbance: Secondary | ICD-10-CM | POA: Diagnosis not present

## 2020-09-06 ENCOUNTER — Other Ambulatory Visit: Payer: PPO

## 2020-09-07 DIAGNOSIS — M6389 Disorders of muscle in diseases classified elsewhere, multiple sites: Secondary | ICD-10-CM | POA: Diagnosis not present

## 2020-09-07 DIAGNOSIS — R2689 Other abnormalities of gait and mobility: Secondary | ICD-10-CM | POA: Diagnosis not present

## 2020-09-07 DIAGNOSIS — S065X1A Traumatic subdural hemorrhage with loss of consciousness of 30 minutes or less, initial encounter: Secondary | ICD-10-CM | POA: Diagnosis not present

## 2020-09-07 DIAGNOSIS — Z9181 History of falling: Secondary | ICD-10-CM | POA: Diagnosis not present

## 2020-09-07 DIAGNOSIS — R278 Other lack of coordination: Secondary | ICD-10-CM | POA: Diagnosis not present

## 2020-09-08 ENCOUNTER — Ambulatory Visit: Payer: PPO

## 2020-09-08 ENCOUNTER — Other Ambulatory Visit: Payer: Self-pay

## 2020-09-08 DIAGNOSIS — I6523 Occlusion and stenosis of bilateral carotid arteries: Secondary | ICD-10-CM

## 2020-09-08 DIAGNOSIS — C44619 Basal cell carcinoma of skin of left upper limb, including shoulder: Secondary | ICD-10-CM | POA: Diagnosis not present

## 2020-09-08 DIAGNOSIS — G4733 Obstructive sleep apnea (adult) (pediatric): Secondary | ICD-10-CM | POA: Diagnosis not present

## 2020-09-08 DIAGNOSIS — D849 Immunodeficiency, unspecified: Secondary | ICD-10-CM | POA: Diagnosis not present

## 2020-09-08 DIAGNOSIS — D485 Neoplasm of uncertain behavior of skin: Secondary | ICD-10-CM | POA: Diagnosis not present

## 2020-09-08 DIAGNOSIS — L821 Other seborrheic keratosis: Secondary | ICD-10-CM | POA: Diagnosis not present

## 2020-09-08 DIAGNOSIS — L570 Actinic keratosis: Secondary | ICD-10-CM | POA: Diagnosis not present

## 2020-09-09 ENCOUNTER — Ambulatory Visit: Payer: PPO | Admitting: Cardiology

## 2020-09-09 DIAGNOSIS — Z9181 History of falling: Secondary | ICD-10-CM | POA: Diagnosis not present

## 2020-09-09 DIAGNOSIS — M6389 Disorders of muscle in diseases classified elsewhere, multiple sites: Secondary | ICD-10-CM | POA: Diagnosis not present

## 2020-09-09 DIAGNOSIS — R2689 Other abnormalities of gait and mobility: Secondary | ICD-10-CM | POA: Diagnosis not present

## 2020-09-09 DIAGNOSIS — R278 Other lack of coordination: Secondary | ICD-10-CM | POA: Diagnosis not present

## 2020-09-09 DIAGNOSIS — S065X1A Traumatic subdural hemorrhage with loss of consciousness of 30 minutes or less, initial encounter: Secondary | ICD-10-CM | POA: Diagnosis not present

## 2020-09-11 ENCOUNTER — Other Ambulatory Visit: Payer: Self-pay | Admitting: Cardiology

## 2020-09-11 DIAGNOSIS — I6523 Occlusion and stenosis of bilateral carotid arteries: Secondary | ICD-10-CM

## 2020-09-13 ENCOUNTER — Encounter: Payer: Self-pay | Admitting: Neurology

## 2020-09-13 ENCOUNTER — Ambulatory Visit: Payer: PPO | Admitting: Cardiology

## 2020-09-14 DIAGNOSIS — Z9181 History of falling: Secondary | ICD-10-CM | POA: Diagnosis not present

## 2020-09-14 DIAGNOSIS — M6389 Disorders of muscle in diseases classified elsewhere, multiple sites: Secondary | ICD-10-CM | POA: Diagnosis not present

## 2020-09-14 DIAGNOSIS — R2689 Other abnormalities of gait and mobility: Secondary | ICD-10-CM | POA: Diagnosis not present

## 2020-09-14 DIAGNOSIS — R278 Other lack of coordination: Secondary | ICD-10-CM | POA: Diagnosis not present

## 2020-09-14 DIAGNOSIS — S065X1A Traumatic subdural hemorrhage with loss of consciousness of 30 minutes or less, initial encounter: Secondary | ICD-10-CM | POA: Diagnosis not present

## 2020-09-15 ENCOUNTER — Ambulatory Visit: Payer: PPO | Admitting: Neurology

## 2020-09-15 ENCOUNTER — Encounter: Payer: Self-pay | Admitting: Neurology

## 2020-09-15 VITALS — BP 140/83 | HR 55 | Ht 71.0 in | Wt 177.5 lb

## 2020-09-15 DIAGNOSIS — G4730 Sleep apnea, unspecified: Secondary | ICD-10-CM

## 2020-09-15 DIAGNOSIS — I6523 Occlusion and stenosis of bilateral carotid arteries: Secondary | ICD-10-CM | POA: Diagnosis not present

## 2020-09-15 DIAGNOSIS — C760 Malignant neoplasm of head, face and neck: Secondary | ICD-10-CM

## 2020-09-15 DIAGNOSIS — R001 Bradycardia, unspecified: Secondary | ICD-10-CM | POA: Diagnosis not present

## 2020-09-15 DIAGNOSIS — G3184 Mild cognitive impairment, so stated: Secondary | ICD-10-CM

## 2020-09-15 DIAGNOSIS — S065X9A Traumatic subdural hemorrhage with loss of consciousness of unspecified duration, initial encounter: Secondary | ICD-10-CM

## 2020-09-15 DIAGNOSIS — G4710 Hypersomnia, unspecified: Secondary | ICD-10-CM | POA: Diagnosis not present

## 2020-09-15 DIAGNOSIS — I609 Nontraumatic subarachnoid hemorrhage, unspecified: Secondary | ICD-10-CM | POA: Diagnosis not present

## 2020-09-15 DIAGNOSIS — S065XAA Traumatic subdural hemorrhage with loss of consciousness status unknown, initial encounter: Secondary | ICD-10-CM

## 2020-09-15 NOTE — Progress Notes (Signed)
SLEEP MEDICINE CLINIC    Provider:  Larey Seat, MD  Primary Care Physician:  Crist Infante, [place] [address]     Referring Provider: Dr. Adrian Prows, MD  / Crist Infante, MD       Chief Complaint according to patient   Patient presents with:    . New Patient (Initial Visit)     Follow up for more falls- incontinence- confusion, memory loss        INTERVAL HISTORY: Mr. Colello is a 79 year old Caucasian gentleman with a history of progressive dementia and sleep apnea who initially had a good response to CPAP use.   He did develop more amnestic spells and also more frequent falls. He suffered a SAH / SDH in 06-2020. He is not allowed to drive anymore, a concern that we had to address in the presence of his wife.  He has had a repeat MRI showing absorption of blood and only small vessel disease wit little atrophy 07-20-2020. In the meantime more falls , even after d/c of Aricept and switch to Exelon.  During a recent walk on the beach  , he became " spacey" , and had urinary incontinence twice - and didn't notice it either time. He walked very unsteady, storky and stiffly. There is every day a spell of appearing unsteady or confused.  He had 2 falls after leaning forward, losing balance, similar to the one that caused SDH. His MOCA score has increased but his functional capacity has clearly decreased.  He was confused this morning abut day of the week and where they were going, but while here he could answer the related questions.  Mr. Scholze sees a therapist twice weekly for PT/ OT and has gotten a heel lift. This improved his Byrd test. (?).  Mr. Parents very recent spell where he appeared not fully oriented not fully steady and had not noticed his bladder dysfunction could indicate a seizure-like event.  This acute confusion was last for maybe 30 minutes or longer.   he rested on the sa ofa afterwards-. He is fatigued after those spells so I will definitely  want to do an EEG. I want to look at NPH as well.      HISTORY OF PRESENT ILLNESS:  Mr. Figg is a 79 year old Caucasian gentleman with a history of progressive dementia and sleep apnea who initially had a good response to CPAP use.  He did develop more amnestic spells and also more frequent falls.  He had 12 falls since April of this year.  He had a SAH/SDH 7 days ago ! 07-04-2020  He was by his report trying to open the blinds in his bedroom and the next thing his wife heard was a thump, she found him unconscious on the floor and he had fallen to the left side to the left side.  EMS was called the patient went to the [facility] emergency room at CT scan there showed subdural hematoma.  The patient underwent observational stay in the ICU but did not require surgery or drain.  He was asked to hold aspirin until 07-20-2020. His hospital course was summarized by a colleague who was stated that he had an acute traumatic subdural arachnoid hemorrhage and subdural hemorrhage the work-up was completed by CT it was an extra-axial hemorrhage 24-hour head CT second day showed already slight improvement in the volume.  His antiplatelet agents were held there was no other therapy necessary.   compression stockings  were used for DVT prophylaxis, he presented with a leukocytosis which resolved he presented hyponatremic 130s which also resolved, and he was assessed by rehabilitation was a recommendation for PT OT and speech they were consulted and followed him during the hospital observation and he was evaluated for rehabilitation needs but remained home and discharged home with supervision.  Home health PT asked for a shower chair.   The patients underlying alzheimer's diagnosis may have progressed with this traumatic bleed.   Montreal Cognitive Assessment  09/15/2020 09/15/2020 07/13/2020  Visuospatial/ Executive (0/5) 4 1 2   Naming (0/3) 3 - 3  Attention: Read list of digits (0/2) 1 - 2  Attention: Read list of  letters (0/1) 1 - 1  Attention: Serial 7 subtraction starting at 100 (0/3) 3 - 3  Language: Repeat phrase (0/2) 2 - 2  Language : Fluency (0/1) 0 - 0  Abstraction (0/2) 2 - 2  Delayed Recall (0/5) 2 - 0  Orientation (0/6) 6 - 6  Total 24 - 21   Montreal Cognitive Assessment Blind 09/15/2020 07/13/2020  Attention: Read list of digits (0/2) 1 2  Attention: Read list of letters (0/1) 1 1  Attention: Serial 7 subtraction starting at 100 (0/3) 3 3  Language: Repeat phrase (0/2) 2 2  Language : Fluency (0/1) 0 0  Abstraction (0/2) 2 2  Delayed Recall (0/5) 2 0  Orientation (0/6) 6 6  Total 17 16    [facility]  NEUROIMAGING REPORT   STUDY DATE: 07/20/20 PATIENT NAME: PERRY BRUCATO DOB: June 17, 1941 MRN: [id number]  ORDERING CLINICIAN: Jerrilynn Mikowski, Asencion Partridge, MD  CLINICAL HISTORY: 79 year old male with memory loss.  EXAM: MR BRAIN W WO CONTRAST  TECHNIQUE: MRI of the brain with and without contrast was obtained utilizing 5 mm axial slices with T1, T2, T2 flair, SWI and diffusion weighted views.  T1 sagittal, T2 coronal and postcontrast views in the axial and coronal plane were obtained. CONTRAST: 43ml multihance  COMPARISON: 06/03/19 CT  IMAGING SITE: [facility] [address] (1.5 Tesla MRI)    FINDINGS:   No abnormal lesions are seen on diffusion-weighted views to suggest acute ischemia. The cortical sulci, fissures and cisterns are notable for mild perisylvian atrophy. Lateral, third and fourth ventricle are mildly enlarged on ex vacuo basis. No extra-axial fluid collections are seen. No evidence of mass effect or midline shift.  Mild periventricular and subcortical and pontine chronic small vessel ischemic disease. No abnormal lesions on post-contrast views.   On sagittal views the posterior fossa, pituitary gland and corpus callosum are unremarkable. No evidence of intracranial hemorrhage on SWI views. The orbits and their contents, paranasal  sinuses and calvarium are unremarkable.  Intracranial flow voids are present.   IMPRESSION:   MRI brain (with and without) demonstrating: - Mild chronic small vessel ischemic disease.  - Mild atrophy. - No acute findings.    INTERPRETING PHYSICIAN:  Penni Bombard, MD Certified in Neurology, Neurophysiology and Neuroimaging  Reviewed in the presence of the patient,  Larey Seat, M.D.   RV on 05-19-2020.  JAIDEEP POLLACK is a 80 y.o. year old Caucasian male patient with dementia and OSA-, with good response to CPAP treatment in the setting of OSA- He is bored and that , he feels, lets him to sleep more.  Sleep apnea identified in the study from 3-24 2021 by home sleep test revealed severe obstructive sleep apnea with an AHI of 32.6 RDI was 35.8 indicating some additional  snoring.  In rem sleep there was a slightly higher degree of apnea AHI was 39.4.  The patient started on AutoSet pressure therapy he has been very compliant 100% of the last 30 days he has used the machine and each night over 4 hours consecutively.  The average user time is 7 hours 50 minutes.  The AutoSet machine offers a pressure range between 5 and 16 cmH2O with 3 cm EPR and his residual AHI is 2.5/h now he does have some air leaks but they seem not to affect the apnea control and the 95th percentile pressure is 12 cmH2O well within the current settings so I do not have to change any.  Of the remaining low apnea count 1.8 as central in nature and 0.1 obstructive.  I would like for the patient to continue with this therapy.  His fatigue score was endorsed at 9 and his Epworth sleepiness score at 1 point.  And the patient has changed to Exelon po now increased to 3 mg bid.     Following up today on 02/19/2020. His PSG did not give any answers as he couldn't sleep. He underwent HST and has been given a diagnosis pf sevre OSA, with bradycardia and AHI of 32/h. CPAP is needed . We discussed in detail the potential  benefits of OSA treatment for bradycardia and hopefully benefits for his underlying dementia. Due to his facial structures I requested and evaluation face- to -face.     12-19-2019. I will perform a MOCA The problem of recurrent insomnia is discussed.I reviewed his 2 cardiac monitor test results from Dr Milderd Meager office.  One episode of bradycardia, one of tachycardia- both reportedly asymptomatic.  He is awaiting sleep study with full EEG on 12-29-2019.   We will do MOCA today- and discuss interval history- Mr Thrun is denying any trouble. His wife gives additional information. He fell at night over the toilet, he could not walk and stand steady the following day - he bumped into the left - and was " a little spacey  His BP was all over the place. And this morning he had 94/ 68 mmHg on the left, and the right sided BP was also low at  109/ 73 mmHg. He is at high fall risk, and he has cognitive impairment, too. MMSE was 28/ 30, but is not detecting differential difficulties, such as visio-spatial problems.  MOCA revealed some word delay, he missed all 5 recall words and he was not able to abstract as well-.  MOCA was 24/ 30 points. He has a delayed reaction time- he shall no longer drive.    Seen here upon referral by cardiology on  09/15/2020.  We will get the first and be correct.  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  Which included scapular skin flap closure.  Removal of the parotid gland the TMJ joint the lymph nodes C-section branches of the trigeminal nerve were involved and damaged surgery was followed by radiation treatments he was followed by Dr. Caryl Never at [facility].  In 2019 he had knee pain and some balance issues he did physical therapy and received 2 steroid shots to the knee.  In 2018 he had an obstructed tear ducts that needed to be repaired deviated  septum and a lift of the area under the outer right eye.  This was  again done by Dr. Janeal Holmes.  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  In 2020 July 21 he had a fall at [facility] hit his head had a facial laceration and felt dizzy and weak could not walk followed by an ER visit at [facility] CT negative there was no contusion laceration was treated.  In October again feeling of dizziness and weakness while walking in the morning sat down did not fall saw the nurse practitioner at Dr. Tempie Donning office who started a referral to cardiology.  He has a history of hypertension, has a history of mild dementia early stages, cardiology diagnosed him with carotid artery stenosis but also kidneys chronic kidney disease stage II, hyperlipidemia, you recently had these 2 with syncope or near syncopal episodes so he underwent a Lexiscan nuclear stress test which showed only a small area of ischemia in the inner apical wall but it was still felt to be a low risk study.  Echocardiogram was essentially normal carotid duplex showed left external carotid over 50% stenosis otherwise mild this would not be affecting his brain function.  He was placed on a 2-week cardiac monitor there were no arrhythmias but he did have bradycardia and supraventricular tachycardia.  Blood pressures have reportedly been stable.   Chief concern according to patient :  " I am so sleepy"     I have the pleasure of seeing MCCAULEY DIEHL today, a right-handed Caucasian male for evaluation of a possible sleep disorder.      Family medical /sleep history: no other family member on CPAP with OSA, insomnia, sleep walking.    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 Daughter is a PA with [employer].  Pets are not present. Tobacco use: quit 54 years ago-  in the [employer] [place].   ETOH use none , Caffeine intake in form of Coffee( 2 cups in AM) Soda( none) Tea ( caffeinated at dinner ) or energy drinks.Regular exercise in form of - walking   Hobbies : none    Sleep habits are as follows: The patient's dinner time is between 5-6.30 PM. The patient goes to bed at 10 PM and continues to sleep for 7-8 hours, wakes for 3-4  bathroom breaks, the first time at 1 AM.   The preferred sleep position is supine and side, with the support of 2 pillows.  Dreams are reportedly rare. He belches a lot o at night.    7.30  AM is the usual rise time. The patient wakes up spontaneously/.  He reports  feeling refreshed or restored in AM, but he naps quickly- 9.30-10 and needs to be woken-  Lunch is about 12.30 and after lunch he naps again- 45 minutes, and I later afternoon again.  Total daily sleep time over 12 hours.  They moved to [facility] 08-2018 when his memory became impaired..    Review of Systems: Out of a complete 14 system review, the patient complains of only the following symptoms, and all other reviewed systems are negative.:  Fatigue, sleepiness , snoring, fragmented sleep, hypertension.    How likely are you to doze in the following situations: 0 = not likely, 1 = slight chance, 2 = moderate chance, 3 = high chance   Sitting and Reading? Watching Television? Sitting inactive in a public place (theater  or meeting)? As a passenger in a car for an hour without a break? Lying down in the afternoon when circumstances permit? Sitting and talking to someone? Sitting quietly after lunch without alcohol? In a car, while stopped for a few minutes in traffic?   Total = 3/ 24 points . Wife would tend to 16 points.   FSS endorsed at 9/ 63 points.   Social History   Socioeconomic History  . Marital status: Married    Spouse name: Not on file  . Number of children: 2  . Years of education: Not on file  . Highest education level: Master's  degree (e.g., MA, MS, MEng, MEd, MSW, MBA)  Occupational History  . Occupation: retired  Tobacco Use  . Smoking status: Former Smoker    Packs/day: 0.25    Years: 3.00    Pack years: 0.75    Quit date: 08/21/1966    Years since quitting: 54.1  . Smokeless tobacco: Never Used  Vaping Use  . Vaping Use: Never used  Substance and Sexual Activity  . Alcohol use: Yes    Comment: limited  . Drug use: No  . Sexual activity: Yes  Other Topics Concern  . Not on file  Social History Narrative  . Not on file   Social Determinants of Health   Financial Resource Strain:   . Difficulty of Paying Living Expenses: Not on file  Food Insecurity:   . Worried About Charity fundraiser in the Last Year: Not on file  . Ran Out of Food in the Last Year: Not on file  Transportation Needs:   . Lack of Transportation (Medical): Not on file  . Lack of Transportation (Non-Medical): Not on file  Physical Activity:   . Days of Exercise per Week: Not on file  . Minutes of Exercise per Session: Not on file  Stress:   . Feeling of Stress : Not on file  Social Connections:   . Frequency of Communication with Friends and Family: Not on file  . Frequency of Social Gatherings with Friends and Family: Not on file  . Attends Religious Services: Not on file  . Active Member of Clubs or Organizations: Not on file  . Attends Archivist Meetings: Not on file  . Marital Status: Not on file    Family History  Problem Relation Age of Onset  . Heart disease Father   . Heart attack Father   . Alzheimer's disease Mother   . Heart failure Brother   . Colon cancer Neg Hx   . Stomach cancer Neg Hx   . Esophageal cancer Neg Hx   . Rectal cancer Neg Hx   . Liver cancer Neg Hx     Past Medical History:  Diagnosis Date  . Colon polyps   . Diverticulosis   . Hypercholesterolemia   . Hypertension   . Internal hemorrhoids     Past Surgical History:  Procedure Laterality Date  . EYE SURGERY      X 4  . HERNIA REPAIR  2006   abdominal  . SKIN CANCER EXCISION     shoulder, basal cell  . SQUAMOUS CELL CARCINOMA EXCISION     preauricular on the right  . TONSILLECTOMY  1947  . VASECTOMY  1981     Current Outpatient Medications on File Prior to Visit  Medication Sig Dispense Refill  . atorvastatin (LIPITOR) 20 MG tablet Take 20 mg by mouth daily at 6 PM.     .  citalopram (CELEXA) 10 MG tablet Take 10 mg by mouth daily.    . Multiple Vitamin (MULTIVITAMIN) tablet Take 1 tablet by mouth daily.    . niacinamide 500 MG tablet Take 500 mg by mouth 2 (two) times daily with a meal.    . ramipril (ALTACE) 2.5 MG capsule Take 2.5 mg by mouth daily.    . rivastigmine (EXELON) 3 MG capsule Take 3 mg by mouth in the morning and at bedtime.    . ASPIRIN 81 PO Take by mouth. (Patient not taking: Reported on 07/13/2020)     No current facility-administered medications on file prior to visit.    Allergies  Allergen Reactions  . Other Other (See Comments)    Vicryl sutures, pte. Doesn't know what reaction he had, "Drs. Owens Shark + Yeatts deetrmined not to use it on me"    Physical exam:  Today's Vitals   09/15/20 1319  BP: 140/83  Pulse: (!) 55  Weight: 177 lb 8 oz (80.5 kg)  Height: 5\' 11"  (1.803 m)   Body mass index is 24.76 kg/m.   Wt Readings from Last 3 Encounters:  09/15/20 177 lb 8 oz (80.5 kg)  07/13/20 176 lb (79.8 kg)  05/19/20 182 lb (82.6 kg)     Ht Readings from Last 3 Encounters:  09/15/20 5\' 11"  (1.803 m)  07/13/20 5\' 11"  (1.803 m)  05/19/20 6' (1.829 m)      General: The patient is awake, alert and appears not in acute distress. The patient is well groomed. Head: Normocephalic, atraumatic. Neck is supple. Mallampati ,  neck circumference:15 inches . Nasal airflow  patent.   Dental status:  Cardiovascular:  Regular rate and cardiac rhythm by pulse,  without distended neck veins. Respiratory: Lungs are clear to auscultation.  Skin:  Without evidence of ankle  edema, or rash. Trunk: The patient's posture is erect.   Neurologic exam : The patient is awake and alert, oriented to place and time.    MMSE - Mini Mental State Exam 12/19/2019  Orientation to time 5  Orientation to Place 4  Registration 3  Attention/ Calculation 5  Recall 3  Language- name 2 objects 2  Language- repeat 1  Language- follow 3 step command 3  Language- read & follow direction 1  Write a sentence 1  Copy design 1  Total score 29     Memory subjective described as impaired. Attention span & concentration ability appears normal. He speaks very louldly and slowly- measured.  Speech is fluent,  without dysarthria, dysphonia or aphasia.  He has hearing aids.   Mood and affect are appropriate.   Cranial nerves: no loss of smell or taste reported  Right eye - natural pupil- ptosis, after nasal septum repaired. Funduscopic exam deferred   Extraocular movements in vertical and horizontal planes were full  and without nystagmus.  No Diplopia. Right ptosis is related to scar surgical formation-  Visual fields by finger perimetry are intact. Hearing impaired - has hearing aids in situ.  Facial sensation to fine touch is impaired in the left face- radiation and surgery.  Facial motor strength is asymmetric  - the tongue  moved midline.  Uvula was deviated to the left, appears to stick to the pillar. .  Neck ROM : had neck resection -  rotation, tilt and flexion extension were normal for age and shoulder shrug was symmetrical.    Motor exam:  Symmetric bulk, tone and ROM.   Normal tone without cog wheeling,  symmetric grip strength .   Sensory:  Fine touch, pinprick and vibration were normal.  Proprioception tested in the upper extremities was normal.   Coordination: Rapid alternating movements in the fingers/hands were deferred.  The Finger-to-nose maneuver was intact without evidence of ataxia, dysmetria or tremor.   Gait and station: Patient could rise unassisted from  a seated position, walked without assistive device.  Stance is of normal width/ base .  Toe and heel walk were deferred.  Deep tendon reflexes: in the upper and lower extremities are symmetric and intact.  Babinski response was deferred.       After spending a total time of 30 minutes face to face and additional time for physical and neurologic examination, review of laboratory studies, ED reports and images at [facility].  personal review of imaging studies, reports and results of other testing and review of referral information / records as far as provided in visit, I have established the following assessments:  1) Alzheimer's type  dementia, just borderline to MCI- MOCA was 24/ 30 last visit - Stay on [place] . No driving.!!!    2) From April on there have been 17 falls. falls - WHY- ? We worked him up for bradycardia, we looked at orthopedics, at neuropathy-   He walks very stiffly, he turns with 5 steps, he is not wide based.  He uses a cane , 4 prongs.  PT and OT have helped, there are less falls- but the last events were partially concerning for a seizure.  Ordered EEG, consider NPH -   his carotid disease has worsened to over 50%- he falls forward or to the left.     3) we did  address his severe OSA ! Attended sleep study full EEG had failed - due to Insomnia- and HST was needed. He was clearly affected. Severe OSA confirmed and treated on auto CPAP with a very good resolution, some residual centrals- not bad- and best mask fit for his special needs. The patient endorsed the Epworth sleepiness score at 5 the fatigue severity score at the lowest score which is 9 points.  He showed excellent compliance for CPAP use he has a minimum pressure of 5 maximum pressure of 16 with 3 cm expiratory pressure relief.  100% compliance by days and hours.  AHI is 0.5/h which is an excellent resolution    In short, ELMORE HYSLOP is presenting with  Alzheimer's type dementia and status post SAH/  SDH , traumatic , on 07-05-220 I plan to follow up either personally or through our NP within 2-3 month. I ordered an EEG and await Dr Irven Shelling input about his carotid dopplers- .    Electronically signed by: Larey Seat, MD 09/15/2020 1:47 PM  [facility] Board certified by The [employer] of Sleep Medicine and Diplomate of the [employer] of Sleep Medicine. Board certified In Neurology through the [place], Fellow of the [employer] of Neurology. Medical Director of [facility].

## 2020-09-15 NOTE — Patient Instructions (Addendum)
Normal-Pressure Hydrocephalus Normal pressure hydrocephalus (NPH) is a buildup of fluid (cerebrospinal fluid, CSF) inside the brain. This may or may not increase the pressure inside the brain (intracranial pressure). CSF is normally present in the brain, but when too much CSF increases pressure on the brain, it can affect brain function. NPH usually occurs in people who are older than age 79. Many symptoms of NPH are also associated with aging or certain medical conditions. It is important to pay attention to changes in behavior and mental function. What are the causes? This condition may be caused by anything that blocks the flow of CSF, such as:  Head injury.  Infections.  Brain surgery.  Bleeding from a blood vessel in the brain.  Tumors or cancer. In many cases, the cause of this condition is not known. What are the signs or symptoms? Symptoms of this condition may include:  Difficulty walking, such as: ? Shuffling feet when walking. ? Unsteadiness. ? Problems when beginning to walk. ? Feet feeling as though they are stuck or "frozen" to the floor.  Problems with bowel and bladder control.  Memory problems, such as: ? Forgetfulness. ? Lack of concentration. ? Mood problems. ? Confusion. How is this diagnosed? This condition is diagnosed based on:  Your medical history.  A physical exam. This can reveal walking (gait) changes or twitching or sudden muscle tightening (spasms) in the legs.  Other tests to confirm the diagnosis and identify the best options for treatment. These tests include: ? Removal and examination of a small amount of CSF (lumbar puncture). ? CT scan of your head. ? MRI scan of your head. How is this treated?  This condition may be treated with surgery in which a narrow tube (ventriculoperitoneal shunt, or VP shunt) is placed in the brain to drain excess CSF. After a shunt is placed, you will be monitored to make sure CSF is draining properly.  Depending on your age and overall health condition, you may not be a candidate for surgery. If your symptoms are mild, your condition may be monitored on a regular basis before a decision is made about whether a shunt is needed. Follow these instructions at home: Medicines  Take over-the-counter and prescription medicines only as told by your health care provider.  Avoid taking medicines that can affect thinking, such as pain or sleeping medicines. Lifestyle  Do not use any products that contain nicotine or tobacco, such as cigarettes and e-cigarettes. If you need help quitting, ask your health care provider.  Drink enough fluid to keep your urine clear or pale yellow. If you have a shunt:  Do not wear tight-fitting hats or headgear.  Before having an MRI or abdominal surgery, tell your health care provider that you have a shunt.  Watch for signs of infection around the shunt, such as: ? Fever. ? Redness, pain, or swelling of the skin along the shunt path. ? Headache or stiff neck. ? Nausea or vomiting. General instructions  Work with your health care provider to determine what you need help with and what your safety needs are.  Ask your health care provider when you can resume your normal activities.  Keep all follow-up visits as told by your health care provider. This is important. Contact a health care provider if:  You have any new symptoms.  Your symptoms get worse.  Your symptoms do not improve after surgery. Get help right away if:  You have: ? A fever or other signs of infection. ?  New or worsening confusion. ? New or worsening sleepiness. ? A hard time staying awake. ? A headache that is getting worse.  You start to twitch or shake (seizure).  You lose coordination or balance.  Your or your family members become concerned for your safety. Summary  Normal pressure hydrocephalus (NPH) is a buildup of fluid (cerebrospinal fluid, CSF) inside the brain. Too  much CSF can put pressure on the brain and affect its function.  Anything that blocks the flow of CSF can cause this condition. In some cases, the cause of this condition is not known.  Depending on your age, overall health condition, and diagnostic tests, you may be a good candidate for a shunt to drain excess CSF. This information is not intended to replace advice given to you by your health care provider. Make sure you discuss any questions you have with your health care provider. Document Revised: 03/19/2018 Document Reviewed: 11/30/2016 Elsevier Patient Education  [place]. Dementia Dementia is a condition that affects the way the brain works. It often affects memory and thinking. There are many types of dementia. Some types get worse with time and cannot be reversed. Some types of dementia include:  Alzheimer's disease. This is the most common type.  Vascular dementia. This type may happen due to a stroke.  Lewy body dementia. This type may happen to people who have Parkinson's disease.  Frontotemporal dementia. This type is caused by damage to nerve cells in certain parts of the brain. Some people may have more than one type, and this is called mixed dementia. What are the causes? This condition is caused by damage to cells in the brain. Some causes that cannot be reversed include:  Having a condition that affects the blood vessels of the brain, such as diabetes, heart disease, or blood vessel disease.  Changes to genes. Some causes that can be reversed or slowed include:  Injury to the brain.  Certain medicines.  Infection.  Not having enough vitamin B12 in the body, or thyroid problems.  A tumor or blood clot in the brain. What are the signs or symptoms? Symptoms depend on the type of dementia. This may include:  Problems remembering things.  Having trouble taking a bath or putting clothes on.  Forgetting appointments.  Forgetting to pay  bills.  Trouble planning and making meals.  Having trouble speaking.  Getting lost easily. How is this treated? Treatment depends on the cause of the dementia. It might include taking medicines that help:  To control the dementia.  To slow down the dementia.  To manage symptoms. In some cases, treating the cause of your dementia can improve symptoms, reverse symptoms, or slow down how quickly it gets worse. Your doctor can help you find support groups and other doctors who can help with your care. Follow these instructions at home: Medicines  Take over-the-counter and prescription medicines only as told by your doctor.  Use a pill organizer to help you manage your medicines.  Avoidtaking medicines for pain or for sleep. Lifestyle  Make healthy choices: ? Be active as told by your doctor. ? Do not use any products that contain nicotine or tobacco, such as cigarettes, e-cigarettes, and chewing tobacco. If you need help quitting, ask your doctor. ? Do not drink alcohol. ? When you get stressed, do something that will help you to relax. Your doctor can give you tips. ? Spend time with other people.  Make sure you get good sleep. To get  good sleep: ? Try not to take naps during the day. ? Keep your bedroom dark and cool. ? In the few hours before you go to bed, try not to do any exercise. ? Do not have foods and drinks with caffeine at night. Eating and drinking  Drink enough fluid to keep your pee (urine) pale yellow.  Eat a healthy diet. General instructions   Talk with your doctor to figure out: ? What you need help with. ? What your safety needs are.  Ask your doctor if it is safe for you to drive.  If told, wear a bracelet that tracks where you are or shows that you are a person with memory loss.  Work with your family to make big decisions.  Keep all follow-up visits as told by your doctor. This is important. Contact a doctor if:  You have any new  symptoms.  Your symptoms get worse.  You have problems with swallowing or choking. Get help right away if:  You feel very sad, or feel that you want to harm yourself.  You or your family members are worried for your safety. If you ever feel like you may hurt yourself or others, or have thoughts about taking your own life, get help right away. You can go to your nearest emergency department or call:  Your local emergency services (911 in the [place]  A suicide crisis helpline, such as the [facility] at 801 589 9705. This is open 24 hours a day. Summary  Dementia often affects memory and thinking.  Some types of dementia get worse with time and cannot be reversed.  Treatment for this condition depends on the cause.  Talk with your doctor to figure out what you need help with.  Your doctor can help you find support groups and other doctors who can help with your care. This information is not intended to replace advice given to you by your health care provider. Make sure you discuss any questions you have with your health care provider. Document Revised: 01/14/2019 Document Reviewed: 01/14/2019 Elsevier Patient Education  [place]. Confusion Confusion is the inability to think with the usual speed or clarity. People who are confused often describe their thinking as cloudy or unclear. Confusion can also include feeling disoriented. This means you are unaware of where you are or who you are. You may also not know the date or time. When confused, you may have difficulty remembering, paying attention, or making decisions. Some people also act aggressively when they are confused. In some cases, confusion may come on quickly. In other cases, it may develop slowly over time. How quickly confusion comes on depends on the cause. Confusion may be caused by:  Head injury (concussion).  Seizures.  Stroke.  Fever.  Brain tumor.  Decrease in brain  function due to a vascular or neurologic condition (dementia).  Emotions, like rage or terror.  Inability to know what is real and what is not (hallucinations).  Infections, such as a urinary tract infection (UTI).  Using too much alcohol, drugs, or medicines.  Loss of fluid (dehydration) or an imbalance of salts in the body (electrolytes).  Lack of sleep.  Low blood sugar (diabetes).  Low levels of oxygen. This comes from conditions such as chronic lung disorders.  Side effects of medicines, or taking medicines that affect other medicines (drug interactions).  Lack of certain nutrients, especially niacin, thiamine, vitamin C, or vitamin B.  Sudden drop in body temperature (hypothermia).  Change in routine, such as traveling or being hospitalized. Follow these instructions at home: Pay attention to your symptoms. Tell your health care provider about any changes or if you develop new symptoms. Follow these instructions to control or treat symptoms. Ask a family member or friend for help if needed. Medicines  Take over-the-counter and prescription medicines only as told by your health care provider.  Ask your health care provider about changing or stopping any medicines that may be causing your confusion.  Avoid pain medicines or sleep medicines until you have fully recovered.  Use a pillbox or an alarm to help you take the right medicines at the right time. Lifestyle   Eat a balanced diet that includes fruits and vegetables.  Get enough sleep. For most adults, this is 7-9 hours each night.  Do not drink alcohol.  Do not become isolated. Spend time with other people and make plans for your days.  Do not drive until your health care provider says that it is safe to do so.  Do not use any products that contain nicotine or tobacco, such as cigarettes and e-cigarettes. If you need help quitting, ask your health care provider.  Stop other activities that may increase your  chances of getting hurt. These may include some work duties, sports activities, swimming, or bike riding. Ask your health care provider what activities are safe for you. What caregivers can do  Find out if the person is confused. Ask the person to state his or her name, age, and the date. If the person is unsure or answers incorrectly, he or she may be confused.  Always introduce yourself, no matter how well the person knows you.  Remind the person of his or her location. Do this often.  Place a calendar and clock near the person who is confused.  Talk about current events and plans for the day.  Keep the environment calm, quiet, and peaceful.  Help the person do the things that he or she is unable to do. These include: ? Taking medicines. ? Keeping follow-up visits with his or her health care provider. ? Helping with household duties, including meal preparation. ? Running errands.  Get help if you need it. There are several support groups for caregivers.  If the person you are helping needs more support, consider day care, extended care programs, or a skilled nursing facility. The person's health care provider may be able to help evaluate these options. General instructions  Monitor yourself for any conditions you may have. These may include: ? Checking your blood glucose levels, if you have diabetes. ? Watching your weight, if you are overweight. ? Monitoring your blood pressure, if you have hypertension. ? Monitoring your body temperature, if you have a fever.  Keep all follow-up visits as told by your health care provider. This is important. Contact a health care provider if:  Your symptoms get worse. Get help right away if you:  Feel that you are not able to care for yourself.  Develop severe headaches, repeated vomiting, seizures, blackouts, or slurred speech.  Have increasing confusion, weakness, numbness, restlessness, or personality changes.  Develop a loss of  balance, have marked dizziness, feel uncoordinated, or fall.  Develop severe anxiety, or you have delusions or hallucinations. These symptoms may represent a serious problem that is an emergency. Do not wait to see if the symptoms will go away. Get medical help right away. Call your local emergency services (911 in the [place] Do not drive  yourself to the hospital. Summary  Confusion is the inability to think with the usual speed or clarity. People who are confused often describe their thinking as cloudy or unclear.  Confusion can also include having difficulty remembering, paying attention, or making decisions.  Confusion may come on quickly or develop slowly over time, depending on the cause. There are many different causes of confusion.  Ask for help from family members or friends if you are unable to take care of yourself. This information is not intended to replace advice given to you by your health care provider. Make sure you discuss any questions you have with your health care provider. Document Revised: 11/01/2017 Document Reviewed: 11/01/2017 Elsevier Patient Education  2020 [employer].

## 2020-09-16 DIAGNOSIS — R2689 Other abnormalities of gait and mobility: Secondary | ICD-10-CM | POA: Diagnosis not present

## 2020-09-16 DIAGNOSIS — S065X1A Traumatic subdural hemorrhage with loss of consciousness of 30 minutes or less, initial encounter: Secondary | ICD-10-CM | POA: Diagnosis not present

## 2020-09-16 DIAGNOSIS — R278 Other lack of coordination: Secondary | ICD-10-CM | POA: Diagnosis not present

## 2020-09-16 DIAGNOSIS — M6389 Disorders of muscle in diseases classified elsewhere, multiple sites: Secondary | ICD-10-CM | POA: Diagnosis not present

## 2020-09-16 DIAGNOSIS — Z9181 History of falling: Secondary | ICD-10-CM | POA: Diagnosis not present

## 2020-09-17 ENCOUNTER — Ambulatory Visit: Payer: PPO | Admitting: Cardiology

## 2020-09-17 ENCOUNTER — Other Ambulatory Visit: Payer: Self-pay

## 2020-09-17 ENCOUNTER — Encounter: Payer: Self-pay | Admitting: Cardiology

## 2020-09-17 VITALS — BP 143/80 | HR 69 | Resp 16 | Ht 71.0 in | Wt 177.8 lb

## 2020-09-17 DIAGNOSIS — R296 Repeated falls: Secondary | ICD-10-CM

## 2020-09-17 DIAGNOSIS — F0280 Dementia in other diseases classified elsewhere without behavioral disturbance: Secondary | ICD-10-CM

## 2020-09-17 DIAGNOSIS — I6523 Occlusion and stenosis of bilateral carotid arteries: Secondary | ICD-10-CM

## 2020-09-17 DIAGNOSIS — G301 Alzheimer's disease with late onset: Secondary | ICD-10-CM

## 2020-09-17 DIAGNOSIS — I10 Essential (primary) hypertension: Secondary | ICD-10-CM | POA: Diagnosis not present

## 2020-09-17 DIAGNOSIS — G4730 Sleep apnea, unspecified: Secondary | ICD-10-CM

## 2020-09-17 NOTE — Progress Notes (Signed)
Primary Physician:  Crist Infante, MD   Patient ID: Jeffrey [name], male    DOB: 1940/12/21, 79 y.o.   MRN: [id number]  Subjective:    Chief Complaint  Patient presents with   Carotid    Carotid Ultrasound for stenosis   Follow-up    HPI: Jeffrey [name]  is a 79 y.o. male  with hypertension, kidney disease stage III,  hyperlipidemia, gait instability and frequent falls.he has had brief episodes of unrelated atrial tachycardia during event monitoring in 2020.  Due to marked fatigue and his symptoms suggestive of sleep apnea, he was referred to Dr. Roddie Mc and has now been diagnosed with severe obstructive sleep apnea.  He underwent carotid artery duplex and presents for follow-up.  He has had 3 falls over the past 6 months and one of the falls he had intracranial hemorrhage as well.  He has been scheduled for lumbar puncture to evaluate for hydrocephalus although MRI has been reassuring.  He is tolerating his antihypertensive medications and also statins well.  Denies chest pain or shortness of breath.  He has not had any syncope.  Daughter and wife present at bedside.   Past Medical History:  Diagnosis Date   Colon polyps    Diverticulosis    Hypercholesterolemia    Hypertension    Internal hemorrhoids     Past Surgical History:  Procedure Laterality Date   EYE SURGERY     X 4   HERNIA REPAIR  2006   abdominal   SKIN CANCER EXCISION     shoulder, basal cell   SQUAMOUS CELL CARCINOMA EXCISION     preauricular on the right   TONSILLECTOMY  1947   VASECTOMY  1981   Social History   Tobacco Use   Smoking status: Former Smoker    Packs/day: 0.25    Years: 3.00    Pack years: 0.75    Types: Cigarettes    Quit date: 08/21/1966    Years since quitting: 54.1   Smokeless tobacco: Never Used  Substance Use Topics   Alcohol use: Yes    Comment: limited  Marital Status: Married    Review of Systems  Cardiovascular: Negative for chest pain, dyspnea  on exertion and leg swelling.  Skin: Positive for skin cancer.  Musculoskeletal: Positive for arthritis.  Gastrointestinal: Negative for melena.  Neurological: Positive for disturbances in coordination and loss of balance. Negative for dizziness and focal weakness.  Psychiatric/Behavioral: Positive for memory loss.      Objective:  Blood pressure (!) 143/80, pulse 69, resp. rate 16, height _0  (1.803 m), weight 177 lb 12.8 oz (80.6 kg), SpO2 96 %. Body mass index is 24.8 kg/m.    Physical Exam Vitals reviewed.  Constitutional:      Appearance: He is well-developed.  HENT:     Head: Atraumatic.  Cardiovascular:     Rate and Rhythm: Normal rate and regular rhythm.     Pulses: Intact distal pulses.          Carotid pulses are on the right side with bruit and on the left side with bruit.      Radial pulses are 2+ on the right side and 1+ on the left side.     Heart sounds: Normal heart sounds.  Pulmonary:     Effort: Pulmonary effort is normal. No accessory muscle usage or respiratory distress.     Breath sounds: Normal breath sounds.  Abdominal:     General: Bowel sounds are  normal.     Palpations: Abdomen is soft.  Musculoskeletal:        General: Normal range of motion.     Cervical back: Normal range of motion.  Skin:    General: Skin is warm and dry.  Neurological:     Mental Status: He is alert and oriented to person, place, and time.    Radiology: No results found.  Laboratory examination:   External Labs:  Collected: 03/24/2020 Creatinine 1.1 mg/dL. eGFR: 65 mL/min per 1.73 m Lipid profile: Total cholesterol 142 triglycerides 38, HDL 58, LDL 76 non-HDL 84 apolipoprotein B 60. TSH: 1.2  PRN Meds:. Medications Discontinued During This Encounter  Medication Reason   ASPIRIN 81 PO Discontinued by provider   Current Outpatient Medications on File Prior to Visit  Medication Sig Dispense Refill   atorvastatin (LIPITOR) 20 MG tablet Take 20 mg by mouth  daily at 6 PM.      citalopram (CELEXA) 10 MG tablet Take 10 mg by mouth daily.     Multiple Vitamin (MULTIVITAMIN) tablet Take 1 tablet by mouth daily.     niacinamide 500 MG tablet Take 500 mg by mouth 2 (two) times daily with a meal.     ramipril (ALTACE) 2.5 MG capsule Take 2.5 mg by mouth daily.     rivastigmine (EXELON) 4.5 MG capsule Take 3 mg by mouth in the morning and at bedtime.      No current facility-administered medications on file prior to visit.    Cardiac Studies:   Event monitor 10/21/2019 - 11/19/2019: Diagnostic time: 96%  Dominant rhythm: Sinus. HR 48-196 bpm. Avg HR 66 bpm. One episode of wide complex tachycardia at 196 bpm on 11/10/2019 at 6:34 PM CT, lasted for at least two min. Likely SVT with underlying incomplete LBBB, although VT cannot be excluded.  No symptoms reported with this event. Other episodes of occasional PAC's seen. No atrial fibrillation/atrial flutter/high grade AV block, sinus pause >3sec noted. Symptoms of fatigue correlate with sinus rhythm with and without PAC's.  14 day event monitor 11/6-11/20/20: Normal sinus rhythm. No critical events or patient triggered events. 1 episode on Day 13 at 2242 is concerning for atrial flutter vs. atrial tachycardia at 140 bpm that persisted for greater than 5 minutes. Maximum HR 141 bpm on 11/18 at 2242. Minimum HR 48 bpm on 11/14 at 0513. Episodes of marked sinus bradycardia. No high degree AV block noted.  [place] Stress Test 08/25/2019: 1. Resting EKG normal sinus rhythm, left axis deviation, poor R progression, IVCD.  Stress EKG nondiagnostic due to Lexiscan infusion. Hypertensive both in rest and stress with resting  BP 160/100 mm Hg.  2. Perfusion imaging study reveals the left ventricle to be normal in both rest and stress images.  There is a moderate sized mild ischemia noted in inferior and infero-apical wall extending from the base towards the apex.  Left ventricular systolic function was  preserved at 53% without wall motion abnormality. This represents a low risk study in view of very minimal ischemia and normal wall motion. No previous exam available for comparison.  Echocardiogram 08/29/2019:  Normal LV systolic function with visual EF 55-60%. Normal global wall motion. Left ventricle cavity is normal in size. Moderate concentric hypertrophy of the left ventricle. Doppler evidence of grade I (impaired) diastolic dysfunction, normal LAP. Trileaflet aortic valve. Mild (Grade I) aortic regurgitation. The aortic root is normal in size. There is a complex plaque noted at the tubular aorta.  See image.  Carotid artery duplex 09/08/2020:  Stenosis in the right internal carotid artery (50-69%). Stenosis in the right external carotid artery (>50%). Stenosis in the left internal carotid artery (50-69%). Stenosis in the left external carotid artery (<50%). Antegrade right vertebral artery flow. Antegrade left vertebral artery flow. Follow up in six months is appropriate if clinically indicated. Compared to the study done on 08/28/2020, there is progression of stenosis severity bilaterally.  EKG:   EKG 09/17/2020: Normal sinus rhythm at rate of 61 bpm, left atrial enlargement, left axis deviation, left intrafascicular block.  Poor R progression, cannot exclude anteroseptal infarct old.  IVCD, incomplete left bundle branch block.  LVH.  Normal QT interval.  No significant change from 08/22/2019.  Assessment:     ICD-10-CM   1. Asymptomatic bilateral carotid artery stenosis  I65.23 EKG 12-Lead  2. Severe sleep apnea  G47.30   3. Late onset Alzheimer's dementia without behavioral disturbance (HCC)  G30.1    F02.80   4. Frequent falls  R29.6     Recommendations:   Jeffrey [name]  is a 79 y.o. male  with hypertension, kidney disease stage III,  hyperlipidemia, gait instability and frequent falls.he has had brief episodes of unrelated atrial tachycardia during event monitoring in  2020.  Due to marked fatigue and his symptoms suggestive of sleep apnea, he was referred to Dr. Roddie Mc and has now been diagnosed with severe obstructive sleep apnea.  He underwent carotid artery duplex and presents for follow-up.   I reviewed the results of the carotid artery duplex.  His symptoms of frequent fall, dementia is not explained by carotid stenosis.  Overall since being treated for obstructive sleep apnea, his alertness, fatigue has improved significantly.  Frequent fall and one episode of fall leading to intracerebral hemorrhage is probably related to accidental fall and not syncope.  I do not think he needs a loop recorder implantation or pacemaker.  I also advised the family that he does not need carotid endarterectomy.  To evaluate stability, will recheck carotid artery duplex in 6 months.  I also reviewed his labs, lipids under good control.  He is on a statin.  With regard to hypertension, in view of carotid stenosis, age, I have accepted his present blood pressure to be at goal.  I did not make any changes to his medication.  I will see him back in 6 months, if his mental status were to deteriorate with regard to dementia, they could certainly cancel the test.  His daughter, Ms. Mattie Marlin, is a PA who I know well and she can contact me as well.   Adrian Prows, MD, [facility] 09/17/2020, 2:57 PM Office: [phone] Pager: [phone]

## 2020-09-21 ENCOUNTER — Telehealth: Payer: Self-pay | Admitting: Neurology

## 2020-09-21 DIAGNOSIS — R2689 Other abnormalities of gait and mobility: Secondary | ICD-10-CM | POA: Diagnosis not present

## 2020-09-21 DIAGNOSIS — Z9181 History of falling: Secondary | ICD-10-CM | POA: Diagnosis not present

## 2020-09-21 DIAGNOSIS — M50323 Other cervical disc degeneration at C6-C7 level: Secondary | ICD-10-CM | POA: Diagnosis not present

## 2020-09-21 DIAGNOSIS — R58 Hemorrhage, not elsewhere classified: Secondary | ICD-10-CM | POA: Diagnosis not present

## 2020-09-21 DIAGNOSIS — Z79899 Other long term (current) drug therapy: Secondary | ICD-10-CM | POA: Diagnosis not present

## 2020-09-21 DIAGNOSIS — I10 Essential (primary) hypertension: Secondary | ICD-10-CM | POA: Diagnosis not present

## 2020-09-21 DIAGNOSIS — R278 Other lack of coordination: Secondary | ICD-10-CM | POA: Diagnosis not present

## 2020-09-21 DIAGNOSIS — I447 Left bundle-branch block, unspecified: Secondary | ICD-10-CM | POA: Diagnosis not present

## 2020-09-21 DIAGNOSIS — Z7982 Long term (current) use of aspirin: Secondary | ICD-10-CM | POA: Diagnosis not present

## 2020-09-21 DIAGNOSIS — R296 Repeated falls: Secondary | ICD-10-CM | POA: Diagnosis not present

## 2020-09-21 DIAGNOSIS — W19XXXA Unspecified fall, initial encounter: Secondary | ICD-10-CM | POA: Diagnosis not present

## 2020-09-21 DIAGNOSIS — R9431 Abnormal electrocardiogram [ECG] [EKG]: Secondary | ICD-10-CM | POA: Diagnosis not present

## 2020-09-21 DIAGNOSIS — S0990XA Unspecified injury of head, initial encounter: Secondary | ICD-10-CM | POA: Diagnosis not present

## 2020-09-21 DIAGNOSIS — Z87891 Personal history of nicotine dependence: Secondary | ICD-10-CM | POA: Diagnosis not present

## 2020-09-21 DIAGNOSIS — M6389 Disorders of muscle in diseases classified elsewhere, multiple sites: Secondary | ICD-10-CM | POA: Diagnosis not present

## 2020-09-21 DIAGNOSIS — G8911 Acute pain due to trauma: Secondary | ICD-10-CM | POA: Diagnosis not present

## 2020-09-21 DIAGNOSIS — Q248 Other specified congenital malformations of heart: Secondary | ICD-10-CM | POA: Diagnosis not present

## 2020-09-21 DIAGNOSIS — G4733 Obstructive sleep apnea (adult) (pediatric): Secondary | ICD-10-CM | POA: Diagnosis not present

## 2020-09-21 DIAGNOSIS — S0101XA Laceration without foreign body of scalp, initial encounter: Secondary | ICD-10-CM | POA: Diagnosis not present

## 2020-09-21 DIAGNOSIS — Z91048 Other nonmedicinal substance allergy status: Secondary | ICD-10-CM | POA: Diagnosis not present

## 2020-09-21 DIAGNOSIS — S065X1A Traumatic subdural hemorrhage with loss of consciousness of 30 minutes or less, initial encounter: Secondary | ICD-10-CM | POA: Diagnosis not present

## 2020-09-21 NOTE — Telephone Encounter (Signed)
Myriam Jacobson is Dr. Brett Fairy wanting patient to have a EEG here at [facility] ?

## 2020-09-22 ENCOUNTER — Telehealth: Payer: Self-pay | Admitting: Neurology

## 2020-09-22 DIAGNOSIS — R278 Other lack of coordination: Secondary | ICD-10-CM | POA: Diagnosis not present

## 2020-09-22 DIAGNOSIS — M6389 Disorders of muscle in diseases classified elsewhere, multiple sites: Secondary | ICD-10-CM | POA: Diagnosis not present

## 2020-09-22 DIAGNOSIS — Z9181 History of falling: Secondary | ICD-10-CM | POA: Diagnosis not present

## 2020-09-22 DIAGNOSIS — S065X1A Traumatic subdural hemorrhage with loss of consciousness of 30 minutes or less, initial encounter: Secondary | ICD-10-CM | POA: Diagnosis not present

## 2020-09-22 DIAGNOSIS — R2689 Other abnormalities of gait and mobility: Secondary | ICD-10-CM | POA: Diagnosis not present

## 2020-09-22 NOTE — Telephone Encounter (Signed)
Pt.'s daughter Judson Roch is on [place]. She states Dad had a recent fall, he currently has staples & they're planning to travel. She's wanting advice as to what to do. Please advise.

## 2020-09-22 NOTE — Telephone Encounter (Signed)
I think he is OK to go to [place]. He is not presenting with NPH, last memory test in office was really good- baffling.   He may need to use a wheelchair for fall prevention outside.

## 2020-09-22 NOTE — Telephone Encounter (Signed)
Called the daughter back, she Is a PA with [employer]. She would really like to talk with Dr Brett Fairy about him.  He had a fall last night in garage, fell backwards and hit his head. They got to him quickly but took him in and he got staples. No SDH or LOC. They are scheduled to go to [place] and she is just wanting to touch base with MD to discuss if ok for them to go, if there are any concerns other than the obvious fall risk, which she advised they would be with him. I informed that Dr Brett Fairy is with patients today and that I would make her aware of her call. Informed either Dr Brett Fairy will call back if she can or I will call with her thoughts

## 2020-09-23 ENCOUNTER — Encounter: Payer: Self-pay | Admitting: Neurology

## 2020-09-23 NOTE — Telephone Encounter (Signed)
Called the patient's daughter, there was no answer. Unable to leave a VM due to mailbox was full. Ill send a mychart as well.  If daughter returns call, I just wasn't sure if Dr Brett Fairy had called back. Based off her note it was unclear if she was called. I wanted to call and advise that it is safe for the patient to travel. As long as there are no symptoms that reflect he is having a decline in his mental status.  She recommended possibly having a wheelchair for him since in a new area, this may prevent falls outside.

## 2020-09-28 DIAGNOSIS — S065X1A Traumatic subdural hemorrhage with loss of consciousness of 30 minutes or less, initial encounter: Secondary | ICD-10-CM | POA: Diagnosis not present

## 2020-09-28 DIAGNOSIS — Z9181 History of falling: Secondary | ICD-10-CM | POA: Diagnosis not present

## 2020-09-28 DIAGNOSIS — M6389 Disorders of muscle in diseases classified elsewhere, multiple sites: Secondary | ICD-10-CM | POA: Diagnosis not present

## 2020-09-28 DIAGNOSIS — R2689 Other abnormalities of gait and mobility: Secondary | ICD-10-CM | POA: Diagnosis not present

## 2020-09-28 DIAGNOSIS — R278 Other lack of coordination: Secondary | ICD-10-CM | POA: Diagnosis not present

## 2020-09-29 ENCOUNTER — Ambulatory Visit: Payer: PPO | Admitting: Neurology

## 2020-09-29 DIAGNOSIS — R410 Disorientation, unspecified: Secondary | ICD-10-CM

## 2020-09-29 DIAGNOSIS — I6523 Occlusion and stenosis of bilateral carotid arteries: Secondary | ICD-10-CM

## 2020-09-29 DIAGNOSIS — G3184 Mild cognitive impairment, so stated: Secondary | ICD-10-CM

## 2020-09-29 DIAGNOSIS — G4710 Hypersomnia, unspecified: Secondary | ICD-10-CM

## 2020-09-29 DIAGNOSIS — R001 Bradycardia, unspecified: Secondary | ICD-10-CM

## 2020-09-29 DIAGNOSIS — G4730 Sleep apnea, unspecified: Secondary | ICD-10-CM

## 2020-09-29 DIAGNOSIS — I609 Nontraumatic subarachnoid hemorrhage, unspecified: Secondary | ICD-10-CM

## 2020-09-29 DIAGNOSIS — S065X9A Traumatic subdural hemorrhage with loss of consciousness of unspecified duration, initial encounter: Secondary | ICD-10-CM

## 2020-09-29 DIAGNOSIS — C760 Malignant neoplasm of head, face and neck: Secondary | ICD-10-CM

## 2020-09-29 DIAGNOSIS — S065XAA Traumatic subdural hemorrhage with loss of consciousness status unknown, initial encounter: Secondary | ICD-10-CM

## 2020-09-30 ENCOUNTER — Encounter: Payer: Self-pay | Admitting: Neurology

## 2020-09-30 DIAGNOSIS — M6389 Disorders of muscle in diseases classified elsewhere, multiple sites: Secondary | ICD-10-CM | POA: Diagnosis not present

## 2020-09-30 DIAGNOSIS — R278 Other lack of coordination: Secondary | ICD-10-CM | POA: Diagnosis not present

## 2020-09-30 DIAGNOSIS — R2689 Other abnormalities of gait and mobility: Secondary | ICD-10-CM | POA: Diagnosis not present

## 2020-09-30 DIAGNOSIS — W1830XA Fall on same level, unspecified, initial encounter: Secondary | ICD-10-CM | POA: Diagnosis not present

## 2020-09-30 DIAGNOSIS — S0181XA Laceration without foreign body of other part of head, initial encounter: Secondary | ICD-10-CM | POA: Diagnosis not present

## 2020-09-30 DIAGNOSIS — S065XAA Traumatic subdural hemorrhage with loss of consciousness status unknown, initial encounter: Secondary | ICD-10-CM | POA: Insufficient documentation

## 2020-09-30 DIAGNOSIS — S065X1A Traumatic subdural hemorrhage with loss of consciousness of 30 minutes or less, initial encounter: Secondary | ICD-10-CM | POA: Diagnosis not present

## 2020-09-30 DIAGNOSIS — I609 Nontraumatic subarachnoid hemorrhage, unspecified: Secondary | ICD-10-CM | POA: Insufficient documentation

## 2020-09-30 DIAGNOSIS — S065X9A Traumatic subdural hemorrhage with loss of consciousness of unspecified duration, initial encounter: Secondary | ICD-10-CM | POA: Insufficient documentation

## 2020-09-30 DIAGNOSIS — Z9181 History of falling: Secondary | ICD-10-CM | POA: Diagnosis not present

## 2020-09-30 DIAGNOSIS — Z4802 Encounter for removal of sutures: Secondary | ICD-10-CM | POA: Diagnosis not present

## 2020-09-30 NOTE — Procedures (Signed)
This EEG was performed according to the international 10-20 system of electrode placement with electrical activity acquired as a sampling rate of 500 Hz.  EEG data were recorded continuously and digitally stored a video could not be stored.  Description the posterior dominant rhythm consists of 8 Hz activity of moderate voltage seen predominantly in the posterior occipital region.  There seems to be minimal reaction to eye opening and closing.  Hyperventilation did show amplitude buildup but seem to also indicates more slowing in the central regions especially the right frontal parietal regions have been extremely slow and there seems to be a high-frequency low amplitude asymmetry on the right temple that does not admit from the left.  Throughout this the EKG state between 58 and 63 bpm and regular sinus rhythm.  Photic stimulation showed entrainment at any frequency.  Following photic entrainment the patient became drowsy and entered non-REM sleep stage I.  The frontal parietal slowing continued and also the right temple phase reversal activity.  Conclusion this is an abnormal EEG the patient is borderline slow but definitely has right hemispheric additional slowing, and there is right temporal parietal phase reversal activity that I consider a dysrhythmia at late 1.  Looking at the history of subdural hemorrhage and multiple falls with possible additional contusion / concussion, there is definitely an abnormal development.  Conclusion this is an abnormal EEG.

## 2020-09-30 NOTE — Progress Notes (Signed)
The frontal parietal  slowing continued and also the right temple phase reversal  activity.   Conclusion: This is an abnormal EEG. The patient's brain waves are borderline slow  but there is unilateral,  right hemispheric additional slowing, and  there is right temporal parietal phase reversal activity that I  consider a dysrhythmia grade 1.  Looking at the history of  subdural hemorrhage and multiple falls with possible additional  contusion / concussion, this is an abnormal  development.

## 2020-10-01 ENCOUNTER — Encounter: Payer: Self-pay | Admitting: Neurology

## 2020-10-01 NOTE — Progress Notes (Signed)
There is more diffuse dysfunction , frontal slowing and temporal asymmetry- Not a normal EEG. NO epileptiform discharges but abnormal.

## 2020-10-01 NOTE — Telephone Encounter (Signed)
I spoke to Carola Rhine, [place] and daughter of the patient and her mother, the patient's spouse today.  Mr Gossard had an episode of urinary control loss, and has become more and more unsteady, also progressive cognitive changes were noted- repeatedly asking the same question, etc.   Daughter wondered about NPH- I reviewed his last CT head report ( [facility], no access to images) from 09-21-2020 and our MRI brain from September 7th 2021.   There is mild brain atrophy and dilated ventricles, but no empty sella.  The patient has suffered repeated falls with head injuries, and SAH was diagnosed around 07-11-2020. CT image from ED at that time was available.   I believe it is possible , but not highly likely,  that the patient has developed NPH.  We dicussed that NPH is the only reversible dementia form and given his symptoms, it is worth looking for.He is not anticoagulated, not even on ASA.  The CT from 11-9 is fresh enough to not have to repeat imaging, the daughter will drop the CD rom with the most recent images off on Monday.   I offered to order a LP under fluoroscopy at [place], measuring opening pressure and removing fluid. We will need to  involve PT , for pre and post LP evaluation.  The patient's spouse, daughter agreed to proceed.   Larey Seat, MD

## 2020-10-04 ENCOUNTER — Other Ambulatory Visit: Payer: Self-pay

## 2020-10-04 ENCOUNTER — Other Ambulatory Visit: Payer: Self-pay | Admitting: Neurology

## 2020-10-04 DIAGNOSIS — G3184 Mild cognitive impairment, so stated: Secondary | ICD-10-CM

## 2020-10-04 DIAGNOSIS — S065X9A Traumatic subdural hemorrhage with loss of consciousness of unspecified duration, initial encounter: Secondary | ICD-10-CM

## 2020-10-04 DIAGNOSIS — I609 Nontraumatic subarachnoid hemorrhage, unspecified: Secondary | ICD-10-CM

## 2020-10-04 DIAGNOSIS — R55 Syncope and collapse: Secondary | ICD-10-CM

## 2020-10-04 DIAGNOSIS — S065XAA Traumatic subdural hemorrhage with loss of consciousness status unknown, initial encounter: Secondary | ICD-10-CM

## 2020-10-05 ENCOUNTER — Other Ambulatory Visit: Payer: Self-pay | Admitting: Neurology

## 2020-10-05 ENCOUNTER — Telehealth: Payer: Self-pay | Admitting: Neurology

## 2020-10-05 DIAGNOSIS — G3184 Mild cognitive impairment, so stated: Secondary | ICD-10-CM

## 2020-10-05 DIAGNOSIS — R278 Other lack of coordination: Secondary | ICD-10-CM | POA: Diagnosis not present

## 2020-10-05 DIAGNOSIS — I609 Nontraumatic subarachnoid hemorrhage, unspecified: Secondary | ICD-10-CM

## 2020-10-05 DIAGNOSIS — S065X9A Traumatic subdural hemorrhage with loss of consciousness of unspecified duration, initial encounter: Secondary | ICD-10-CM

## 2020-10-05 DIAGNOSIS — Z9181 History of falling: Secondary | ICD-10-CM | POA: Diagnosis not present

## 2020-10-05 DIAGNOSIS — M6389 Disorders of muscle in diseases classified elsewhere, multiple sites: Secondary | ICD-10-CM | POA: Diagnosis not present

## 2020-10-05 DIAGNOSIS — S065X1A Traumatic subdural hemorrhage with loss of consciousness of 30 minutes or less, initial encounter: Secondary | ICD-10-CM | POA: Diagnosis not present

## 2020-10-05 DIAGNOSIS — R55 Syncope and collapse: Secondary | ICD-10-CM

## 2020-10-05 DIAGNOSIS — R2689 Other abnormalities of gait and mobility: Secondary | ICD-10-CM | POA: Diagnosis not present

## 2020-10-05 DIAGNOSIS — S065XAA Traumatic subdural hemorrhage with loss of consciousness status unknown, initial encounter: Secondary | ICD-10-CM

## 2020-10-05 DIAGNOSIS — G912 (Idiopathic) normal pressure hydrocephalus: Secondary | ICD-10-CM

## 2020-10-05 NOTE — Telephone Encounter (Signed)
Spoke to [employer] they care calling family to schedule LP for Before and After .  Thanks Hinton Dyer

## 2020-10-08 ENCOUNTER — Other Ambulatory Visit (HOSPITAL_COMMUNITY): Payer: Self-pay | Admitting: Physician Assistant

## 2020-10-09 DIAGNOSIS — G4733 Obstructive sleep apnea (adult) (pediatric): Secondary | ICD-10-CM | POA: Diagnosis not present

## 2020-10-12 ENCOUNTER — Ambulatory Visit (HOSPITAL_COMMUNITY)
Admission: RE | Admit: 2020-10-12 | Discharge: 2020-10-12 | Disposition: A | Discharge status: Home / Self Care | Payer: PPO | Source: Ambulatory Visit | Attending: Neurology | Admitting: Neurology

## 2020-10-12 ENCOUNTER — Other Ambulatory Visit: Payer: Self-pay

## 2020-10-12 ENCOUNTER — Ambulatory Visit (HOSPITAL_COMMUNITY): Admission: RE | Admit: 2020-10-12 | Payer: PPO | Source: Ambulatory Visit

## 2020-10-12 DIAGNOSIS — R410 Disorientation, unspecified: Secondary | ICD-10-CM | POA: Insufficient documentation

## 2020-10-12 DIAGNOSIS — W19XXXA Unspecified fall, initial encounter: Secondary | ICD-10-CM | POA: Diagnosis not present

## 2020-10-12 DIAGNOSIS — Z9181 History of falling: Secondary | ICD-10-CM | POA: Insufficient documentation

## 2020-10-12 DIAGNOSIS — S066X9A Traumatic subarachnoid hemorrhage with loss of consciousness of unspecified duration, initial encounter: Secondary | ICD-10-CM | POA: Insufficient documentation

## 2020-10-12 DIAGNOSIS — S065XAA Traumatic subdural hemorrhage with loss of consciousness status unknown, initial encounter: Secondary | ICD-10-CM

## 2020-10-12 DIAGNOSIS — M6389 Disorders of muscle in diseases classified elsewhere, multiple sites: Secondary | ICD-10-CM | POA: Diagnosis not present

## 2020-10-12 DIAGNOSIS — R32 Unspecified urinary incontinence: Secondary | ICD-10-CM | POA: Diagnosis not present

## 2020-10-12 DIAGNOSIS — S065X1A Traumatic subdural hemorrhage with loss of consciousness of 30 minutes or less, initial encounter: Secondary | ICD-10-CM | POA: Diagnosis not present

## 2020-10-12 DIAGNOSIS — S065X9A Traumatic subdural hemorrhage with loss of consciousness of unspecified duration, initial encounter: Secondary | ICD-10-CM

## 2020-10-12 DIAGNOSIS — R55 Syncope and collapse: Secondary | ICD-10-CM | POA: Diagnosis not present

## 2020-10-12 DIAGNOSIS — R2689 Other abnormalities of gait and mobility: Secondary | ICD-10-CM | POA: Diagnosis not present

## 2020-10-12 DIAGNOSIS — R278 Other lack of coordination: Secondary | ICD-10-CM | POA: Diagnosis not present

## 2020-10-12 DIAGNOSIS — I609 Nontraumatic subarachnoid hemorrhage, unspecified: Secondary | ICD-10-CM

## 2020-10-12 DIAGNOSIS — G3184 Mild cognitive impairment, so stated: Secondary | ICD-10-CM

## 2020-10-12 NOTE — Telephone Encounter (Addendum)
Jeffrey [name], [facility] who tated patient's BP 148/96 before LP. He went to restroom and PT got sitting BP 161/102 right arm,  139/121 left arm. He has not had BP meds today.  Radiologist talked to PT and nurse, decided to reschedule patient due to high BP. I advised will let Dr Brett Fairy know. Otila Kluver stated the patient will be told he can take BP med before LP.

## 2020-10-12 NOTE — Telephone Encounter (Signed)
Received call from [facility] with [facility] radiology scheduling. He stated a new LP order needs to be placed with STAT on order before he can reschedule patient. Orders placed, verbal with co sign required.

## 2020-10-12 NOTE — Addendum Note (Signed)
Addended by: Minna Antis on: 10/12/2020 04:13 PM   Modules accepted: Orders

## 2020-10-12 NOTE — Progress Notes (Signed)
Pt/ Jeffrey [name] here to evaluate and ambulate him. Jeffrey [name] /PT states his bp R/ arm 139/121 L/ arm 164/102 p, 69, earlier it was 148/96 on arrival. Pt has not taken morning BP meds, PT/Jeffrey [name] questions if she should walk him. I called [place] area, Dr Jobe Igo will be doing the LP today. Dr will be updated and call back to speak with PT/ Jeffrey [name].

## 2020-10-12 NOTE — Telephone Encounter (Signed)
[facility] Otila Kluver) called, have a question concerning Lumbar Puncture scheduled in the next 15 minute. Have a question about Pt's blood pressure. Would like a call back as soon as possible. Can contact at [phone].

## 2020-10-12 NOTE — Telephone Encounter (Signed)
Called [facility], spoke with Elmyra Ricks and advised of Dr Dohmeier's request . She stated Otila Kluver is at lunch will return at 2:30. She will give message to [place] with my name, #,  verbalized understanding, appreciation.

## 2020-10-12 NOTE — Telephone Encounter (Signed)
Oh, what a pity! This poor man - can we get a new appointment ASAP?  I wanted this to be done before the holidays , and hope we can get results soon. CD

## 2020-10-13 ENCOUNTER — Encounter: Payer: Self-pay | Admitting: Neurology

## 2020-11-08 DIAGNOSIS — G4733 Obstructive sleep apnea (adult) (pediatric): Secondary | ICD-10-CM | POA: Diagnosis not present

## 2020-11-10 NOTE — Addendum Note (Signed)
Encounter addended by: Melvyn Novas, MD on: 11/10/2020 11:14 AM  Actions taken: Clinical Note Signed

## 2020-11-10 NOTE — Progress Notes (Signed)
The patient had suffered a SAH , after this he had another fall, several falls, and a spell of urinary incontinence and confusion.  Increased confusion after repeated falls.  We were planning a lumbar puncture for NPH rule out after the constellation of symptoms was described.

## 2020-11-23 ENCOUNTER — Other Ambulatory Visit: Payer: PPO

## 2020-11-23 DIAGNOSIS — R2689 Other abnormalities of gait and mobility: Secondary | ICD-10-CM | POA: Diagnosis not present

## 2020-11-23 DIAGNOSIS — R296 Repeated falls: Secondary | ICD-10-CM | POA: Diagnosis not present

## 2020-11-23 DIAGNOSIS — R278 Other lack of coordination: Secondary | ICD-10-CM | POA: Diagnosis not present

## 2020-11-23 DIAGNOSIS — M6389 Disorders of muscle in diseases classified elsewhere, multiple sites: Secondary | ICD-10-CM | POA: Diagnosis not present

## 2020-11-23 DIAGNOSIS — S065X0S Traumatic subdural hemorrhage without loss of consciousness, sequela: Secondary | ICD-10-CM | POA: Diagnosis not present

## 2020-11-25 DIAGNOSIS — M6389 Disorders of muscle in diseases classified elsewhere, multiple sites: Secondary | ICD-10-CM | POA: Diagnosis not present

## 2020-11-25 DIAGNOSIS — S065X0S Traumatic subdural hemorrhage without loss of consciousness, sequela: Secondary | ICD-10-CM | POA: Diagnosis not present

## 2020-11-25 DIAGNOSIS — R278 Other lack of coordination: Secondary | ICD-10-CM | POA: Diagnosis not present

## 2020-11-25 DIAGNOSIS — R296 Repeated falls: Secondary | ICD-10-CM | POA: Diagnosis not present

## 2020-11-25 DIAGNOSIS — R2689 Other abnormalities of gait and mobility: Secondary | ICD-10-CM | POA: Diagnosis not present

## 2020-12-02 DIAGNOSIS — R296 Repeated falls: Secondary | ICD-10-CM | POA: Diagnosis not present

## 2020-12-02 DIAGNOSIS — F039 Unspecified dementia: Secondary | ICD-10-CM | POA: Diagnosis not present

## 2020-12-06 DIAGNOSIS — R29810 Facial weakness: Secondary | ICD-10-CM | POA: Diagnosis not present

## 2020-12-06 DIAGNOSIS — C44222 Squamous cell carcinoma of skin of right ear and external auricular canal: Secondary | ICD-10-CM | POA: Diagnosis not present

## 2020-12-06 DIAGNOSIS — Z85828 Personal history of other malignant neoplasm of skin: Secondary | ICD-10-CM | POA: Diagnosis not present

## 2020-12-07 DIAGNOSIS — S065X0S Traumatic subdural hemorrhage without loss of consciousness, sequela: Secondary | ICD-10-CM | POA: Diagnosis not present

## 2020-12-07 DIAGNOSIS — R2689 Other abnormalities of gait and mobility: Secondary | ICD-10-CM | POA: Diagnosis not present

## 2020-12-07 DIAGNOSIS — M6389 Disorders of muscle in diseases classified elsewhere, multiple sites: Secondary | ICD-10-CM | POA: Diagnosis not present

## 2020-12-07 DIAGNOSIS — R296 Repeated falls: Secondary | ICD-10-CM | POA: Diagnosis not present

## 2020-12-07 DIAGNOSIS — R278 Other lack of coordination: Secondary | ICD-10-CM | POA: Diagnosis not present

## 2020-12-09 DIAGNOSIS — S065X0S Traumatic subdural hemorrhage without loss of consciousness, sequela: Secondary | ICD-10-CM | POA: Diagnosis not present

## 2020-12-09 DIAGNOSIS — R2689 Other abnormalities of gait and mobility: Secondary | ICD-10-CM | POA: Diagnosis not present

## 2020-12-09 DIAGNOSIS — M6389 Disorders of muscle in diseases classified elsewhere, multiple sites: Secondary | ICD-10-CM | POA: Diagnosis not present

## 2020-12-09 DIAGNOSIS — R296 Repeated falls: Secondary | ICD-10-CM | POA: Diagnosis not present

## 2020-12-09 DIAGNOSIS — G4733 Obstructive sleep apnea (adult) (pediatric): Secondary | ICD-10-CM | POA: Diagnosis not present

## 2020-12-09 DIAGNOSIS — R278 Other lack of coordination: Secondary | ICD-10-CM | POA: Diagnosis not present

## 2020-12-13 ENCOUNTER — Other Ambulatory Visit: Payer: PPO

## 2020-12-14 DIAGNOSIS — M6389 Disorders of muscle in diseases classified elsewhere, multiple sites: Secondary | ICD-10-CM | POA: Diagnosis not present

## 2020-12-14 DIAGNOSIS — R296 Repeated falls: Secondary | ICD-10-CM | POA: Diagnosis not present

## 2020-12-14 DIAGNOSIS — R2689 Other abnormalities of gait and mobility: Secondary | ICD-10-CM | POA: Diagnosis not present

## 2020-12-14 DIAGNOSIS — R278 Other lack of coordination: Secondary | ICD-10-CM | POA: Diagnosis not present

## 2020-12-14 DIAGNOSIS — S065X0S Traumatic subdural hemorrhage without loss of consciousness, sequela: Secondary | ICD-10-CM | POA: Diagnosis not present

## 2020-12-15 DIAGNOSIS — L570 Actinic keratosis: Secondary | ICD-10-CM | POA: Diagnosis not present

## 2020-12-15 DIAGNOSIS — Z85828 Personal history of other malignant neoplasm of skin: Secondary | ICD-10-CM | POA: Diagnosis not present

## 2020-12-16 DIAGNOSIS — R278 Other lack of coordination: Secondary | ICD-10-CM | POA: Diagnosis not present

## 2020-12-16 DIAGNOSIS — S065X0S Traumatic subdural hemorrhage without loss of consciousness, sequela: Secondary | ICD-10-CM | POA: Diagnosis not present

## 2020-12-16 DIAGNOSIS — M6389 Disorders of muscle in diseases classified elsewhere, multiple sites: Secondary | ICD-10-CM | POA: Diagnosis not present

## 2020-12-16 DIAGNOSIS — F039 Unspecified dementia: Secondary | ICD-10-CM | POA: Diagnosis not present

## 2020-12-16 DIAGNOSIS — R296 Repeated falls: Secondary | ICD-10-CM | POA: Diagnosis not present

## 2020-12-16 DIAGNOSIS — R2689 Other abnormalities of gait and mobility: Secondary | ICD-10-CM | POA: Diagnosis not present

## 2020-12-21 DIAGNOSIS — R278 Other lack of coordination: Secondary | ICD-10-CM | POA: Diagnosis not present

## 2020-12-21 DIAGNOSIS — G4733 Obstructive sleep apnea (adult) (pediatric): Secondary | ICD-10-CM | POA: Diagnosis not present

## 2020-12-21 DIAGNOSIS — R2689 Other abnormalities of gait and mobility: Secondary | ICD-10-CM | POA: Diagnosis not present

## 2020-12-21 DIAGNOSIS — R296 Repeated falls: Secondary | ICD-10-CM | POA: Diagnosis not present

## 2020-12-21 DIAGNOSIS — M6389 Disorders of muscle in diseases classified elsewhere, multiple sites: Secondary | ICD-10-CM | POA: Diagnosis not present

## 2020-12-21 DIAGNOSIS — S065X0S Traumatic subdural hemorrhage without loss of consciousness, sequela: Secondary | ICD-10-CM | POA: Diagnosis not present

## 2020-12-22 ENCOUNTER — Ambulatory Visit: Payer: PPO | Admitting: Neurology

## 2020-12-22 ENCOUNTER — Encounter: Payer: Self-pay | Admitting: Neurology

## 2020-12-22 VITALS — BP 122/84 | HR 70 | Ht 71.0 in | Wt 171.0 lb

## 2020-12-22 DIAGNOSIS — I471 Supraventricular tachycardia: Secondary | ICD-10-CM | POA: Diagnosis not present

## 2020-12-22 DIAGNOSIS — I6523 Occlusion and stenosis of bilateral carotid arteries: Secondary | ICD-10-CM | POA: Diagnosis not present

## 2020-12-22 DIAGNOSIS — G309 Alzheimer's disease, unspecified: Secondary | ICD-10-CM | POA: Diagnosis not present

## 2020-12-22 DIAGNOSIS — I609 Nontraumatic subarachnoid hemorrhage, unspecified: Secondary | ICD-10-CM

## 2020-12-22 DIAGNOSIS — F0280 Dementia in other diseases classified elsewhere without behavioral disturbance: Secondary | ICD-10-CM

## 2020-12-22 DIAGNOSIS — S065X9A Traumatic subdural hemorrhage with loss of consciousness of unspecified duration, initial encounter: Secondary | ICD-10-CM | POA: Diagnosis not present

## 2020-12-22 DIAGNOSIS — C760 Malignant neoplasm of head, face and neck: Secondary | ICD-10-CM | POA: Diagnosis not present

## 2020-12-22 DIAGNOSIS — S065XAA Traumatic subdural hemorrhage with loss of consciousness status unknown, initial encounter: Secondary | ICD-10-CM

## 2020-12-22 MED ORDER — RIVASTIGMINE 9.5 MG/24HR TD PT24: 9.5000 mg | MEDICATED_PATCH | Freq: Every day | TRANSDERMAL | 12 refills | Status: DC

## 2020-12-22 MED ORDER — CITALOPRAM HYDROBROMIDE 10 MG PO TABS
10.0000 mg | ORAL_TABLET | Freq: Every day | ORAL | 3 refills | Status: DC
Start: 2020-12-22 — End: 2021-12-02

## 2020-12-22 NOTE — Patient Instructions (Signed)
There are well-accepted and sensible ways to reduce risk for Alzheimers disease and other degenerative brain disorders .  Exercise Daily Walk A daily 20 minute walk should be part of your routine. Disease related apathy can be a significant roadblock to exercise and the only way to overcome this is to make it a daily routine and perhaps have a reward at the end (something your loved one loves to eat or drink perhaps) or a personal trainer coming to the home can also be very useful. Most importantly, the patient is much more likely to exercise if the caregiver / spouse does it with him/her. In general a structured, repetitive schedule is best.  General Health: Any diseases which effect your body will effect your brain such as a pneumonia, urinary infection, blood clot, heart attack or stroke. Keep contact with your primary care doctor for regular follow ups.  Sleep. A good nights sleep is healthy for the brain. Seven hours is recommended. If you have insomnia or poor sleep habits we can give you some instructions. If you have sleep apnea wear your mask.  Diet: Eating a heart healthy diet is also a good idea; fish and poultry instead of red meat, nuts (mostly non-peanuts), vegetables, fruits, olive oil or canola oil (instead of butter), minimal salt (use other spices to flavor foods), whole grain rice, bread, cereal and pasta and wine in moderation.Research is now showing that the MIND diet, which is a combination of The Mediterranean diet and the DASH diet, is beneficial for cognitive processing and longevity. Information about this diet can be found in The MIND Diet, a book by Maggie Moon, MS, RDN, and online at https://www.healthline.com/nutrition/mind-diet  Finances, Power of Attorney and Advance Directives: You should consider putting legal safeguards in place with regard to financial and medical decision making. While the spouse always has power of attorney for medical and financial issues in the  absence of any form, you should consider what you want in case the spouse / caregiver is no longer around or capable of making decisions.   The Alzheimers Association Position on Disease Prevention  Can Alzheimer's be prevented? It's a question that continues to intrigue researchers and fuel new investigations. There are no clear-cut answers yet -- partially due to the need for more large-scale studies in diverse populations -- but promising research is under way. The Alzheimer's Association is leading the worldwide effort to find a treatment for Alzheimer's, delay its onset and prevent it from developing.   What causes Alzheimer's? Experts agree that in the vast majority of cases, Alzheimer's, like other common chronic conditions, probably develops as a result of complex interactions among multiple factors, including age, genetics, environment, lifestyle and coexisting medical conditions. Although some risk factors -- such as age or genes -- cannot be changed, other risk factors -- such as high blood pressure and lack of exercise -- usually can be changed to help reduce risk. Research in these areas may lead to new ways to detect those at highest risk.  Prevention studies A small percentage of people with Alzheimer's disease (less than 1 percent) have an early-onset type associated with genetic mutations. Individuals who have these genetic mutations are guaranteed to develop the disease. An ongoing clinical trial conducted by the [facility] ([facility]), is testing whether antibodies to beta-amyloid can reduce the accumulation of beta-amyloid plaque in the brains of people with such genetic mutations and thereby reduce, delay or prevent symptoms. Participants in the trial are receiving antibodies (  or placebo) before they develop symptoms, and the development of beta-amyloid plaques is being monitored by brain scans and other tests.  Another clinical trial, known as the A4 trial  (Anti-Amyloid Treatment in Asymptomatic Alzheimer's), is testing whether antibodies to beta-amyloid can reduce the risk of Alzheimer's disease in older people (ages 31 to 22) at high risk for the disease. The A4 trial is being conducted by the Alzheimer's Disease Cooperative Study.  Though research is still evolving, evidence is strong that people can reduce their risk by making key lifestyle changes, including participating in regular activity and maintaining good heart health. Based on this research, the Alzheimer's Association offers 10 Ways to Love Your Brain -- a collection of tips that can reduce the risk of cognitive decline.  Heart-head connection  New research shows there are things we can do to reduce the risk of mild cognitive impairment and dementia.  Several conditions known to increase the risk of cardiovascular disease -- such as high blood pressure, diabetes and high cholesterol -- also increase the risk of developing Alzheimer's. Some autopsy studies show that as many as 60 percent of individuals with Alzheimer's disease also have cardiovascular disease.  A longstanding question is why some people develop hallmark Alzheimer's plaques and tangles but do not develop the symptoms of Alzheimer's. Vascular disease may help researchers eventually find an answer. Some autopsy studies suggest that plaques and tangles may be present in the brain without causing symptoms of cognitive decline unless the brain also shows evidence of vascular disease. More research is needed to better understand the link between vascular health and Alzheimer's.  Physical exercise and diet Regular physical exercise may be a beneficial strategy to lower the risk of Alzheimer's and vascular dementia. Exercise may directly benefit brain cells by increasing blood and oxygen flow in the brain. Because of its known cardiovascular benefits, a medically approved exercise program is a valuable part of any overall wellness  plan.  Current evidence suggests that heart-healthy eating may also help protect the brain. Heart-healthy eating includes limiting the intake of sugar and saturated fats and making sure to eat plenty of fruits, vegetables, and whole grains. No one diet is best. Two diets that have been studied and may be beneficial are the DASH (Dietary Approaches to Stop Hypertension) diet and the Mediterranean diet. The DASH diet emphasizes vegetables, fruits and fat-free or low-fat dairy products; includes whole grains, fish, poultry, beans, seeds, nuts and vegetable oils; and limits sodium, sweets, sugary beverages and red meats. A Mediterranean diet includes relatively little red meat and emphasizes whole grains, fruits and vegetables, fish and shellfish, and nuts, olive oil and other healthy fats.  Social connections and intellectual activity A number of studies indicate that maintaining strong social connections and keeping mentally active as we age might lower the risk of cognitive decline and Alzheimer's. Experts are not certain about the reason for this association. It may be due to direct mechanisms through which social and mental stimulation strengthen connections between nerve cells in the brain.  Head trauma There appears to be a strong link between future risk of Alzheimer's and serious head trauma, especially when injury involves loss of consciousness. You can help reduce your risk of Alzheimer's by protecting your head. . Wear a seat belt . Use a helmet when participating in sports . "Fall-proof" your home .  What you can do now While research is not yet conclusive, certain lifestyle choices, such as physical activity and diet, may help support brain  health and prevent Alzheimer's. Many of these lifestyle changes have been shown to lower the risk of other diseases, like heart disease and diabetes, which have been linked to Alzheimer's. With few drawbacks and plenty of known benefits, healthy lifestyle  choices can improve your health and possibly protect your brain.  Learn more about brain health. You can help increase our knowledge by considering participation in a clinical study. Our free clinical trial matching services, TrialMatch, can help you find clinical trials in your area that are seeking volunteers.  Understanding prevention research Here are some things to keep in mind about the research underlying much of our current knowledge about possible prevention: . Insights about potentially modifiable risk factors apply to large population groups, not to individuals. Studies can show that factor X is associated with outcome Y, but cannot guarantee that any specific person will have that outcome. As a result, you can "do everything right" and still have a serious health problem or "do everything wrong" and live to be 100. . Much of our current evidence comes from large epidemiological studies such as the [place] Aging Study, the Nurses' Health Study, the Adult Changes in Thought Study and the [employer] These studies explore pre-existing behaviors and use statistical methods to relate those behaviors to health outcomes. This type of study can show an "association" between a factor and an outcome but cannot "prove" cause and effect. This is why we describe evidence based on these studies with such language as "suggests," "may show," "might protect," and "is associated with." . The gold standard for showing cause and effect is a clinical trial in which participants are randomly assigned to a prevention or risk management strategy or a control group. Researchers follow the two groups over time to see if their outcomes differ significantly. . It is unlikely that some prevention or risk management strategies will ever be tested in randomized trials for ethical or practical reasons. One example is exercise. Definitively testing the impact of exercise on Alzheimer's risk would require a huge  trial enrolling thousands of people and following them for many years. The expense and logistics of such a trial would be prohibitive, and it would require some people to go without exercise, a known health benefit.  There are well-accepted and sensible ways to reduce risk for Alzheimers disease and other degenerative brain disorders .  Exercise Daily Walk A daily 20 minute walk should be part of your routine. Disease related apathy can be a significant roadblock to exercise and the only way to overcome this is to make it a daily routine and perhaps have a reward at the end (something your loved one loves to eat or drink perhaps) or a personal trainer coming to the home can also be very useful. Most importantly, the patient is much more likely to exercise if the caregiver / spouse does it with him/her. In general a structured, repetitive schedule is best.  General Health: Any diseases which effect your body will effect your brain such as a pneumonia, urinary infection, blood clot, heart attack or stroke. Keep contact with your primary care doctor for regular follow ups.  Sleep. A good nights sleep is healthy for the brain. Seven hours is recommended. If you have insomnia or poor sleep habits we can give you some instructions. If you have sleep apnea wear your mask.  Diet: Eating a heart healthy diet is also a good idea; fish and poultry instead of red meat, nuts (mostly non-peanuts), vegetables, fruits, olive  oil or canola oil (instead of butter), minimal salt (use other spices to flavor foods), whole grain rice, bread, cereal and pasta and wine in moderation.Research is now showing that the MIND diet, which is a combination of The Mediterranean diet and the DASH diet, is beneficial for cognitive processing and longevity. Information about this diet can be found in The MIND Diet, a book by Doyne Keel, MS, RDN, and online at [employer]bookDist[employer].si  Finances, Power of Attorney and  Advance Directives: You should consider putting legal safeguards in place with regard to financial and medical decision making. While the spouse always has power of attorney for medical and financial issues in the absence of any form, you should consider what you want in case the spouse / caregiver is no longer around or capable of making decisions.   The Alzheimers Association Position on Disease Prevention  Can Alzheimer's be prevented? It's a question that continues to intrigue researchers and fuel new investigations. There are no clear-cut answers yet -- partially due to the need for more large-scale studies in diverse populations -- but promising research is under way. The Alzheimer's Association is leading the worldwide effort to find a treatment for Alzheimer's, delay its onset and prevent it from developing.   What causes Alzheimer's? Experts agree that in the vast majority of cases, Alzheimer's, like other common chronic conditions, probably develops as a result of complex interactions among multiple factors, including age, genetics, environment, lifestyle and coexisting medical conditions. Although some risk factors -- such as age or genes -- cannot be changed, other risk factors -- such as high blood pressure and lack of exercise -- usually can be changed to help reduce risk. Research in these areas may lead to new ways to detect those at highest risk.  Prevention studies A small percentage of people with Alzheimer's disease (less than 1 percent) have an early-onset type associated with genetic mutations. Individuals who have these genetic mutations are guaranteed to develop the disease. An ongoing clinical trial conducted by the [facility] ([facility]), is testing whether antibodies to beta-amyloid can reduce the accumulation of beta-amyloid plaque in the brains of people with such genetic mutations and thereby reduce, delay or prevent symptoms. Participants in the trial  are receiving antibodies (or placebo) before they develop symptoms, and the development of beta-amyloid plaques is being monitored by brain scans and other tests.  Another clinical trial, known as the A4 trial (Anti-Amyloid Treatment in Asymptomatic Alzheimer's), is testing whether antibodies to beta-amyloid can reduce the risk of Alzheimer's disease in older people (ages 34 to 67) at high risk for the disease. The A4 trial is being conducted by the Alzheimer's Disease Cooperative Study.  Though research is still evolving, evidence is strong that people can reduce their risk by making key lifestyle changes, including participating in regular activity and maintaining good heart health. Based on this research, the Alzheimer's Association offers 10 Ways to Love Your Brain -- a collection of tips that can reduce the risk of cognitive decline.  Heart-head connection  New research shows there are things we can do to reduce the risk of mild cognitive impairment and dementia.  Several conditions known to increase the risk of cardiovascular disease -- such as high blood pressure, diabetes and high cholesterol -- also increase the risk of developing Alzheimer's. Some autopsy studies show that as many as 53 percent of individuals with Alzheimer's disease also have cardiovascular disease.  A longstanding question is why some people develop hallmark Alzheimer's  plaques and tangles but do not develop the symptoms of Alzheimer's. Vascular disease may help researchers eventually find an answer. Some autopsy studies suggest that plaques and tangles may be present in the brain without causing symptoms of cognitive decline unless the brain also shows evidence of vascular disease. More research is needed to better understand the link between vascular health and Alzheimer's.  Physical exercise and diet Regular physical exercise may be a beneficial strategy to lower the risk of Alzheimer's and vascular dementia. Exercise  may directly benefit brain cells by increasing blood and oxygen flow in the brain. Because of its known cardiovascular benefits, a medically approved exercise program is a valuable part of any overall wellness plan.  Current evidence suggests that heart-healthy eating may also help protect the brain. Heart-healthy eating includes limiting the intake of sugar and saturated fats and making sure to eat plenty of fruits, vegetables, and whole grains. No one diet is best. Two diets that have been studied and may be beneficial are the DASH (Dietary Approaches to Stop Hypertension) diet and the Mediterranean diet. The DASH diet emphasizes vegetables, fruits and fat-free or low-fat dairy products; includes whole grains, fish, poultry, beans, seeds, nuts and vegetable oils; and limits sodium, sweets, sugary beverages and red meats. A Mediterranean diet includes relatively little red meat and emphasizes whole grains, fruits and vegetables, fish and shellfish, and nuts, olive oil and other healthy fats.  Social connections and intellectual activity A number of studies indicate that maintaining strong social connections and keeping mentally active as we age might lower the risk of cognitive decline and Alzheimer's. Experts are not certain about the reason for this association. It may be due to direct mechanisms through which social and mental stimulation strengthen connections between nerve cells in the brain.  Head trauma There appears to be a strong link between future risk of Alzheimer's and serious head trauma, especially when injury involves loss of consciousness. You can help reduce your risk of Alzheimer's by protecting your head. . Wear a seat belt . Use a helmet when participating in sports . "Fall-proof" your home .  What you can do now While research is not yet conclusive, certain lifestyle choices, such as physical activity and diet, may help support brain health and prevent Alzheimer's. Many of these  lifestyle changes have been shown to lower the risk of other diseases, like heart disease and diabetes, which have been linked to Alzheimer's. With few drawbacks and plenty of known benefits, healthy lifestyle choices can improve your health and possibly protect your brain.  Learn more about brain health. You can help increase our knowledge by considering participation in a clinical study. Our free clinical trial matching services, TrialMatch, can help you find clinical trials in your area that are seeking volunteers.  Understanding prevention research Here are some things to keep in mind about the research underlying much of our current knowledge about possible prevention: . Insights about potentially modifiable risk factors apply to large population groups, not to individuals. Studies can show that factor X is associated with outcome Y, but cannot guarantee that any specific person will have that outcome. As a result, you can "do everything right" and still have a serious health problem or "do everything wrong" and live to be 100. . Much of our current evidence comes from large epidemiological studies such as the [place] Aging Study, the Nurses' Health Study, the Adult Changes in Thought Study and the [employer] These studies explore pre-existing behaviors and use  statistical methods to relate those behaviors to health outcomes. This type of study can show an "association" between a factor and an outcome but cannot "prove" cause and effect. This is why we describe evidence based on these studies with such language as "suggests," "may show," "might protect," and "is associated with." . The gold standard for showing cause and effect is a clinical trial in which participants are randomly assigned to a prevention or risk management strategy or a control group. Researchers follow the two groups over time to see if their outcomes differ significantly. . It is unlikely that some prevention or  risk management strategies will ever be tested in randomized trials for ethical or practical reasons. One example is exercise. Definitively testing the impact of exercise on Alzheimer's risk would require a huge trial enrolling thousands of people and following them for many years. The expense and logistics of such a trial would be prohibitive, and it would require some people to go without exercise, a known health benefit.    Dementia Dementia is a condition that affects the way the brain works. It often affects memory and thinking. There are many types of dementia. Some types get worse with time and cannot be reversed. Some types of dementia include:  Alzheimer's disease. This is the most common type.  Vascular dementia. This type may happen due to a stroke.  Lewy body dementia. This type may happen to people who have Parkinson's disease.  Frontotemporal dementia. This type is caused by damage to nerve cells in certain parts of the brain. Some people may have more than one type. What are the causes? This condition is caused by damage to cells in the brain. Some causes that cannot be reversed include:  Having a condition that affects the blood vessels of the brain, such as diabetes, heart disease, or blood vessel disease.  Changes to genes. Some causes that can be reversed or slowed include:  Injury to the brain.  Certain medicines.  Infection.  Not having enough vitamin B12 in the body, or thyroid problems.  A tumor, blood clot, or too much fluid in the brain.  Certain diseases that cause your body's defense system (immune system) to attack healthy parts of the body. What are the signs or symptoms?  Problems remembering events or people.  Having trouble taking a bath or putting clothes on.  Forgetting appointments.  Forgetting to pay bills.  Trouble planning and making meals.  Having trouble speaking.  Getting lost easily.  Changes in behavior or mood. How is this  treated? Treatment depends on the cause of the dementia. It might include:  Taking medicines for symptoms or to help control or slow down the dementia.  Treating the cause of your dementia. Your doctor can help you find support groups and other doctors who can help with your care. Follow these instructions at home: Medicines  Take over-the-counter and prescription medicines only as told by your doctor.  Use a pill organizer to help you manage your medicines.  Avoidtaking medicines for pain or for sleep. Lifestyle  Make healthy choices: ? Be active as told by your doctor. ? Do not smoke or use any products that contain nicotine or tobacco. If you need help quitting, ask your doctor. ? Do not drink alcohol. ? When you get stressed, do something that will help you relax. Your doctor can give you tips. ? Spend time with other people.  Make sure you get good sleep. To get good sleep: ? Try  not to take naps during the day. ? Keep your bedroom dark and cool. ? In the few hours before you go to bed, try not to do any exercise. ? Do not have foods and drinks with caffeine at night. Eating and drinking  Drink enough fluid to keep your pee (urine) pale yellow.  Eat a healthy diet. General instructions  Talk with your doctor to figure out: ? What you need help with. ? What your safety needs are.  Ask your doctor if it is safe for you to drive.  If told, wear a bracelet that tracks where you are or shows that you are a person with memory loss.  Work with your family to make big decisions.  Keep all follow-up visits.   Where to find more information  Alzheimer's Association: CapitalMile.co.nz  National Institute on Aging: DVDEnthusiasts.nl  World Health Organization: RoleLink.com.br Contact a doctor if:  You have any new symptoms.  Your symptoms get worse.  You have problems with swallowing or choking. Get help right away if:  You feel very sad, or feel that you want  to harm yourself.  Your family members are worried for your safety. Get help right away if you feel like you may hurt yourself or others, or have thoughts about taking your own life. Go to your nearest emergency room or:  Call your local emergency services (911 in the [place]  Call the [place] at 249-073-4732. This is open 24 hours a day.  Text the Crisis Text Line at 618 562 7557. Summary  Dementia often affects memory and thinking.  Some types of dementia get worse with time and cannot be reversed.  Treatment for this condition depends on the cause.  Talk with your doctor to figure out what you need help with.  Your doctor can help you find support groups and other doctors who can help with your care. This information is not intended to replace advice given to you by your health care provider. Make sure you discuss any questions you have with your health care provider. Document Revised: 03/15/2020 Document Reviewed: 03/15/2020 Elsevier Patient Education  [place].

## 2020-12-22 NOTE — Progress Notes (Signed)
SLEEP MEDICINE CLINIC    Provider:  Larey Seat, MD  Primary Care Physician:  Crist Infante, [place] [address]     Referring Provider: Dr. Adrian Prows, MD  / Crist Infante, MD       Chief Complaint according to patient   Patient presents with:    . New Patient (Initial Visit)     Follow up for more falls- incontinence- confusion, memory loss        INTERVAL HISTORY: 12-22-2020, Jeffrey [name] presents with daughter Jeffrey [name] an spouse here for follow up.  His MoCA test was 24 out of 30 last November 3 months ago, this visit was supposed to follow-up and lumbar puncture in the work-up of normal pressure hydrocephalus but the morning when the patient presented to the interventional radiology his blood pressure was deemed too high to undergo the test.  Today's gait examination really does not show the typical festination or shuffling of a normal pressure hydrocephalus.  The patient walks with a wider base and he can turn was 4 steps.  He braced himself when he rose from a seated position but he was not unsteady as he rose.  There is a slight drift towards the left.  He has lost 10 maybe 12 pounds according to my notes since taking the Exelon pill and I wonder if he may have latent nausea with the medication. He has had a.  Of stability in terms of falls and now has started to resume falling again.  There has been 1 incident of urinary incontinence and there is some subclinical stool incontinence no major accidents but certainly smeer contamination. Blood of the January 5, January 23, he fell one time backwards against the wall when he rose from the dining room chair.  He did not hit the floor on the agenda 23rd he fell while getting up from his desk chair and again had hit behind the left ear on his desk he had also some skin tears on the left hand.   On 4 February he fell backwards while using the walker in the street outside at [facility].   Hit the left shoulder and  hand.   In the on the fifth of February he fell in the parking lot as he was trying to put his walker in the car- did not hit the head, legs would not support him, he couldn't rise by himself.   He has a daytime attendant now.     Jeffrey [name] is a 80 year old Caucasian gentleman with a history of progressive dementia and sleep apnea who initially had a good response to CPAP use.   He did develop more amnestic spells and also more frequent falls. He suffered a SAH / SDH in 06-2020. He is not allowed to drive anymore, a concern that we had to address in the presence of his wife.  He has had a repeat MRI showing absorption of blood and only small vessel disease wit little atrophy 07-20-2020. In the meantime more falls , even after d/c of Aricept and switch to Exelon.  During a [place] walk on the beach  , he became " spacey" , and had urinary incontinence twice - and didn't notice it either time. He walked very unsteady, storky and stiffly. There is every day a spell of appearing unsteady or confused.  He had 2 falls after leaning forward, losing balance, similar to the one that caused SDH. His MOCA score has increased but  his functional capacity has clearly decreased.  He was confused this morning abut day of the week and where they were going, but while here he could answer the related questions.  Jeffrey. Popowski sees a therapist twice weekly for PT/ OT and has gotten a heel lift. This improved his Byrd test. (?).  Jeffrey [name] very recent spell where he appeared not fully oriented not fully steady and had not noticed his bladder dysfunction could indicate a seizure-like event.  This acute confusion was last for maybe 30 minutes or longer.   he rested on the sa ofa afterwards-. He is fatigued after those spells so I will definitely want to do an EEG. I want to look at NPH as well.      HISTORY OF PRESENT ILLNESS:  Jeffrey [name] is a 80 year old Caucasian gentleman with a history of progressive dementia and  sleep apnea who initially had a good response to CPAP use.  He did develop more amnestic spells and also more frequent falls.  He had 12 falls since April of this year.  He had a SAH/SDH 7 days ago ! 07-04-2020  He was by his report trying to open the blinds in his bedroom and the next thing his wife heard was a thump, she found him unconscious on the floor and he had fallen to the left side to the left side.  EMS was called the patient went to [facility] emergency room at CT scan there showed subdural hematoma.  The patient underwent observational stay in the ICU but did not require surgery or drain.  He was asked to hold aspirin until 07-20-2020. His hospital course was summarized by a colleague who was stated that he had an acute traumatic subdural arachnoid hemorrhage and subdural hemorrhage the work-up was completed by CT it was an extra-axial hemorrhage 24-hour head CT second day showed already slight improvement in the volume.  His antiplatelet agents were held there was no other therapy necessary.   compression stockings were used for DVT prophylaxis, he presented with a leukocytosis which resolved he presented hyponatremic 130s which also resolved, and he was assessed by rehabilitation was a recommendation for PT OT and speech they were consulted and followed him during the hospital observation and he was evaluated for rehabilitation needs but remained home and discharged home with supervision.  Home health PT asked for a shower chair.   The patients underlying alzheimer's diagnosis may have progressed with this traumatic bleed.   Montreal Cognitive Assessment  09/15/2020 09/15/2020 07/13/2020  Visuospatial/ Executive (0/5) 4 1 2   Naming (0/3) 3 - 3  Attention: Read list of digits (0/2) 1 - 2  Attention: Read list of letters (0/1) 1 - 1  Attention: Serial 7 subtraction starting at 100 (0/3) 3 - 3  Language: Repeat phrase (0/2) 2 - 2  Language : Fluency (0/1) 0 - 0  Abstraction (0/2) 2 - 2   Delayed Recall (0/5) 2 - 0  Orientation (0/6) 6 - 6  Total 24 - 21   Montreal Cognitive Assessment Blind 09/15/2020 07/13/2020  Attention: Read list of digits (0/2) 1 2  Attention: Read list of letters (0/1) 1 1  Attention: Serial 7 subtraction starting at 100 (0/3) 3 3  Language: Repeat phrase (0/2) 2 2  Language : Fluency (0/1) 0 0  Abstraction (0/2) 2 2  Delayed Recall (0/5) 2 0  Orientation (0/6) 6 6  Total 17 16    [facility]  NEUROIMAGING REPORT  STUDY DATE: 07/20/20 PATIENT NAME: SHAQUILLE JANES DOB: January 12, 1941 MRN: [id number]  ORDERING CLINICIAN: Sarin Comunale, Asencion Partridge, MD  CLINICAL HISTORY: 80 year old male with memory loss.  EXAM: Jeffrey BRAIN W WO CONTRAST  TECHNIQUE: MRI of the brain with and without contrast was obtained utilizing 5 mm axial slices with T1, T2, T2 flair, SWI and diffusion weighted views.  T1 sagittal, T2 coronal and postcontrast views in the axial and coronal plane were obtained. CONTRAST: 34ml multihance  COMPARISON: 06/03/19 CT  IMAGING SITE: [facility] [address] (1.5 Tesla MRI)    FINDINGS:   No abnormal lesions are seen on diffusion-weighted views to suggest acute ischemia. The cortical sulci, fissures and cisterns are notable for mild perisylvian atrophy. Lateral, third and fourth ventricle are mildly enlarged on ex vacuo basis. No extra-axial fluid collections are seen. No evidence of mass effect or midline shift.  Mild periventricular and subcortical and pontine chronic small vessel ischemic disease. No abnormal lesions on post-contrast views.   On sagittal views the posterior fossa, pituitary gland and corpus callosum are unremarkable. No evidence of intracranial hemorrhage on SWI views. The orbits and their contents, paranasal sinuses and calvarium are unremarkable.  Intracranial flow voids are present.   IMPRESSION:   MRI brain (with and without) demonstrating: - Mild chronic small vessel  ischemic disease.  - Mild atrophy. - No acute findings.    INTERPRETING PHYSICIAN:  Penni Bombard, MD Certified in Neurology, Neurophysiology and Neuroimaging  Reviewed in the presence of the patient,  Larey Seat, M.D.   RV on 05-19-2020.  BRNADON EOFF is a 80 y.o. year old Caucasian male patient with dementia and OSA-, with good response to CPAP treatment in the setting of OSA- He is bored and that , he feels, lets him to sleep more.  Sleep apnea identified in the study from 3-24 2021 by home sleep test revealed severe obstructive sleep apnea with an AHI of 32.6 RDI was 35.8 indicating some additional snoring.  In rem sleep there was a slightly higher degree of apnea AHI was 39.4.  The patient started on AutoSet pressure therapy he has been very compliant 100% of the last 30 days he has used the machine and each night over 4 hours consecutively.  The average user time is 7 hours 50 minutes.  The AutoSet machine offers a pressure range between 5 and 16 cmH2O with 3 cm EPR and his residual AHI is 2.5/h now he does have some air leaks but they seem not to affect the apnea control and the 95th percentile pressure is 12 cmH2O well within the current settings so I do not have to change any.  Of the remaining low apnea count 1.8 as central in nature and 0.1 obstructive.  I would like for the patient to continue with this therapy.  His fatigue score was endorsed at 9 and his Epworth sleepiness score at 1 point.  And the patient has changed to Exelon po now increased to 3 mg bid.     Following up today on 02/19/2020. His PSG did not give any answers as he couldn't sleep. He underwent HST and has been given a diagnosis pf sevre OSA, with bradycardia and AHI of 32/h. CPAP is needed . We discussed in detail the potential benefits of OSA treatment for bradycardia and hopefully benefits for his underlying dementia. Due to his facial structures I requested and evaluation face- to -face.      12-19-2019. I will perform a  MOCA The problem of recurrent insomnia is discussed.I reviewed his 2 cardiac monitor test results from Dr Milderd Meager office.  One episode of bradycardia, one of tachycardia- both reportedly asymptomatic.  He is awaiting sleep study with full EEG on 12-29-2019.   We will do MOCA today- and discuss interval history- Jeffrey Mccauley is denying any trouble. His wife gives additional information. He fell at night over the toilet, he could not walk and stand steady the following day - he bumped into the left - and was " a little spacey  His BP was all over the place. And this morning he had 94/ 68 mmHg on the left, and the right sided BP was also low at  109/ 73 mmHg. He is at high fall risk, and he has cognitive impairment, too. MMSE was 28/ 30, but is not detecting differential difficulties, such as visio-spatial problems.  MOCA revealed some word delay, he missed all 5 recall words and he was not able to abstract as well-.  MOCA was 24/ 30 points. He has a delayed reaction time- he shall no longer drive.    Seen here upon referral by cardiology on  12/22/2020.  We will get the first and be correct.  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  Which included scapular skin flap closure.  Removal of the parotid gland the TMJ joint the lymph nodes C-section branches of the trigeminal nerve were involved and damaged surgery was followed by radiation treatments he was followed by Dr. Caryl Never at [facility] [place].  In 2019 he had knee pain and some balance issues he did physical therapy and received 2 steroid shots to the knee.  In 2018 he had an obstructed tear ducts that needed to be repaired deviated septum and a lift of the area under the outer right eye.  This was again done by Dr. Janeal Holmes.  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  In 2020 July 21 he had a fall at [facility] hit his head had a facial laceration and felt dizzy and weak could not walk followed by an ER visit at [facility] CT negative there was no contusion laceration was treated.  In October again feeling of dizziness and weakness while walking in the morning sat down did not fall saw the nurse practitioner at Dr. Tempie Donning office who started a referral to cardiology.  He has a history of hypertension, has a history of mild dementia early stages, cardiology diagnosed him with carotid artery stenosis but also kidneys chronic kidney disease stage II, hyperlipidemia, you recently had these 2 with syncope or near syncopal episodes so he underwent a Lexiscan nuclear stress test which showed only a small area of ischemia in the inner apical wall but it was still felt to be a low risk study.  Echocardiogram was essentially normal carotid duplex showed left external carotid over 50% stenosis otherwise mild this would not be affecting his brain function.  He was placed on a 2-week cardiac monitor there were no arrhythmias but he did have bradycardia and supraventricular tachycardia.  Blood pressures have reportedly been stable.   Chief concern according to patient :  " I am so sleepy"     I have the pleasure of seeing Jeffrey [name] today, a right-handed Caucasian male for evaluation  of a possible sleep disorder.      Family medical /sleep history: no other family member on CPAP with OSA, insomnia, sleep walking.    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 Daughter is a PA with [employer].  Pets are not present. Tobacco use: quit 54 years ago- in the [employer] [place].   ETOH use none , Caffeine intake in form of Coffee( 2 cups in AM) Soda( none) Tea ( caffeinated at dinner ) or energy drinks.Regular  exercise in form of - walking   Hobbies : none    Sleep habits are as follows: The patient's dinner time is between 5-6.30 PM. The patient goes to bed at 10 PM and continues to sleep for 7-8 hours, wakes for 3-4  bathroom breaks, the first time at 1 AM.   The preferred sleep position is supine and side, with the support of 2 pillows.  Dreams are reportedly rare. He belches a lot o at night.    7.30  AM is the usual rise time. The patient wakes up spontaneously/.  He reports  feeling refreshed or restored in AM, but he naps quickly- 9.30-10 and needs to be woken-  Lunch is about 12.30 and after lunch he naps again- 45 minutes, and I later afternoon again.  Total daily sleep time over 12 hours.  They moved to [facility] 08-2018 when his memory became impaired..    Review of Systems: Out of a complete 14 system review, the patient complains of only the following symptoms, and all other reviewed systems are negative.:  Fatigue, sleepiness , snoring, fragmented sleep, hypertension.    How likely are you to doze in the following situations: 0 = not likely, 1 = slight chance, 2 = moderate chance, 3 = high chance   Sitting and Reading? Watching Television? Sitting inactive in a public place (theater or meeting)? As a passenger in a car for an hour without a break? Lying down in the afternoon when circumstances permit? Sitting and talking to someone? Sitting quietly after lunch without alcohol? In a car, while stopped for a few minutes in traffic?   Total = 3/ 24 points . Wife would tend to 16 points.   FSS endorsed at 9/ 63 points.   Social History   Socioeconomic History  . Marital status: Married    Spouse name: Not on file  . Number of children: 2  . Years of education: Not on file  . Highest education level: Master's degree (e.g., MA, MS, MEng, MEd, MSW, MBA)  Occupational History  . Occupation: retired  Tobacco Use  . Smoking status: Former Smoker    Packs/day: 0.25     Years: 3.00    Pack years: 0.75    Types: Cigarettes    Quit date: 08/21/1966    Years since quitting: 54.3  . Smokeless tobacco: Never Used  Vaping Use  . Vaping Use: Never used  Substance and Sexual Activity  . Alcohol use: Yes    Comment: limited  . Drug use: No  . Sexual activity: Yes  Other Topics Concern  . Not on file  Social History Narrative  . Not on file   Social Determinants of Health   Financial Resource Strain: Not on file  Food Insecurity: Not on file  Transportation Needs: Not on file  Physical Activity: Not on file  Stress:  Not on file  Social Connections: Not on file    Family History  Problem Relation Age of Onset  . Heart disease Father   . Heart attack Father   . Alzheimer's disease Mother   . Heart failure Brother   . Colon cancer Neg Hx   . Stomach cancer Neg Hx   . Esophageal cancer Neg Hx   . Rectal cancer Neg Hx   . Liver cancer Neg Hx     Past Medical History:  Diagnosis Date  . Colon polyps   . Diverticulosis   . Hypercholesterolemia   . Hypertension   . Internal hemorrhoids     Past Surgical History:  Procedure Laterality Date  . EYE SURGERY     X 4  . HERNIA REPAIR  2006   abdominal  . SKIN CANCER EXCISION     shoulder, basal cell  . SQUAMOUS CELL CARCINOMA EXCISION     preauricular on the right  . TONSILLECTOMY  1947  . VASECTOMY  1981     Current Outpatient Medications on File Prior to Visit  Medication Sig Dispense Refill  . atorvastatin (LIPITOR) 20 MG tablet Take 20 mg by mouth daily at 6 PM.     . citalopram (CELEXA) 10 MG tablet Take 10 mg by mouth daily.    . Multiple Vitamin (MULTIVITAMIN) tablet Take 1 tablet by mouth daily.    . niacinamide 500 MG tablet Take 500 mg by mouth 2 (two) times daily with a meal.    . ramipril (ALTACE) 2.5 MG capsule Take 2.5 mg by mouth daily.    . rivastigmine (EXELON) 4.5 MG capsule Take 4.5 mg by mouth in the morning and at bedtime.     No current facility-administered  medications on file prior to visit.    Allergies  Allergen Reactions  . Other Other (See Comments)    Vicryl sutures, pte. Doesn't know what reaction he had, "Drs. Owens Shark + Yeatts deetrmined not to use it on me"    Physical exam:  Today's Vitals   12/22/20 1331  BP: 122/84  Pulse: 70  Weight: 171 lb (77.6 kg)  Height: 5\' 11"  (1.803 m)   Body mass index is 23.85 kg/m.   Wt Readings from Last 3 Encounters:  12/22/20 171 lb (77.6 kg)  10/12/20 180 lb (81.6 kg)  09/17/20 177 lb 12.8 oz (80.6 kg)     Ht Readings from Last 3 Encounters:  12/22/20 5\' 11"  (1.803 m)  10/12/20 6' (1.829 m)  09/17/20 5\' 11"  (1.803 m)      General: The patient is awake, alert and appears not in acute distress. The patient is well groomed. Head: Normocephalic, atraumatic. Neck is supple. Mallampati ,  neck circumference:15 inches . Nasal airflow  patent.   Dental status:  Cardiovascular:  Regular rate and cardiac rhythm by pulse,  without distended neck veins. Respiratory: Lungs are clear to auscultation.  Skin:  Without evidence of ankle edema, or rash. Trunk: The patient's posture is erect.   Neurologic exam : The patient is awake and alert, oriented to place and time.    MMSE - Mini Mental State Exam 12/19/2019  Orientation to time 5  Orientation to Place 4  Registration 3  Attention/ Calculation 5  Recall 3  Language- name 2 objects 2  Language- repeat 1  Language- follow 3 step command 3  Language- read & follow direction 1  Write a sentence 1  Copy design 1  Total score 29  Memory subjective described as impaired. Attention span & concentration ability appears normal. He speaks very louldly and slowly- measured.  Speech is fluent,  without dysarthria, dysphonia or aphasia.  He has hearing aids.   Mood and affect are appropriate.   Cranial nerves: no loss of smell or taste reported  Right eye - natural pupil- ptosis, after nasal septum repaired. Funduscopic exam deferred    Extraocular movements in vertical and horizontal planes were full  and without nystagmus.  No Diplopia. Right ptosis is related to scar surgical formation-  Visual fields by finger perimetry are intact. Hearing impaired - has hearing aids in situ.  Facial sensation to fine touch is impaired in the left face- radiation and surgery.  Facial motor strength is asymmetric  - the tongue  moved midline.  Uvula was deviated to the left, appears to stick to the pillar. .  Neck ROM : had neck resection -  rotation, tilt and flexion extension were normal for age and shoulder shrug was symmetrical.    Motor exam:  Symmetric bulk, tone and ROM.   Normal tone without cog wheeling, symmetric grip strength .   Sensory:  Fine touch, pinprick and vibration were normal.  Proprioception tested in the upper extremities was normal.   Coordination: Rapid alternating movements in the fingers/hands were deferred.  The Finger-to-nose maneuver was intact without evidence of ataxia, dysmetria or tremor.   Gait and station: Patient could rise unassisted from a seated position, walked without assistive device.  Stance is of normal width/ base .  Toe and heel walk were deferred.  Deep tendon reflexes: in the upper and lower extremities are symmetric and intact.  Babinski response was deferred.       After spending a total time of 30 minutes face to face and additional time for physical and neurologic examination, review of laboratory studies, ED reports and images at [facility].  personal review of imaging studies, reports and results of other testing and review of referral information / records as far as provided in visit, I have established the following assessments:  1) Alzheimer's type  dementia, just borderline to MCI- MOCA was 24/ 30 last visit - Stay on [place] . No driving.!!!    2) From April on there have been 17 falls. falls - WHY- ? We worked him up for bradycardia, we looked at orthopedics, at neuropathy-    He walks very stiffly, he turns with 5 steps, he is not wide based.  He uses a cane , 4 prongs.  PT and OT have helped, there are less falls- but the last events were partially concerning for a seizure.  Ordered EEG, consider NPH -   his carotid disease has worsened to over 50%- he falls forward or to the left.     3) we did not today ( 12-22-2020)  address his severe OSA !  Attended sleep study full EEG had failed - due to Insomnia- and HST was needed. He was clearly affected. Severe OSA confirmed and treated on auto CPAP with a very good resolution, some residual centrals- not bad- and best mask fit for his special needs. The patient endorsed the Epworth sleepiness score at 5 the fatigue severity score at the lowest score which is 9 points.  He showed excellent compliance for CPAP use he has a minimum pressure of 5 maximum pressure of 16 with 3 cm expiratory pressure relief.  100% compliance by days and hours.  AHI is 0.5/h which is an excellent  resolution    In short, JAYLON BOYLEN is presenting with likely Alzheimer's type dementia and status post SAH/ SDH , traumatic , on 07-05-220. We evaluated again for possible NPH> no shuffling gait , only one time incontinence and variable degree of memory loss- good days and bad days. There is some stool incontinence , too.  His facial surgery and SDH have affected his memory, too.   He will remain on Celaxa and Excelon pill - 2 times a day, patch was very expensive.  He lost 10-12 pounds , may be due to latent nausea- change to patch , try again.   He has a Actuary in daytime.   I plan to follow up either personally or through our NP within 3-6 month.  We will forgo the NPH work up, no LP order to be repeated.   Electronically signed by: Larey Seat, MD 12/22/2020 2:03 PM  [facility] Board certified by The [employer] of Sleep Medicine and Diplomate of the [employer] of Sleep  Medicine. Board certified In Neurology through the [place], Fellow of the [employer] of Neurology. Medical Director of [facility].

## 2020-12-23 DIAGNOSIS — R296 Repeated falls: Secondary | ICD-10-CM | POA: Diagnosis not present

## 2020-12-23 DIAGNOSIS — S065X0S Traumatic subdural hemorrhage without loss of consciousness, sequela: Secondary | ICD-10-CM | POA: Diagnosis not present

## 2020-12-23 DIAGNOSIS — M6389 Disorders of muscle in diseases classified elsewhere, multiple sites: Secondary | ICD-10-CM | POA: Diagnosis not present

## 2020-12-23 DIAGNOSIS — R278 Other lack of coordination: Secondary | ICD-10-CM | POA: Diagnosis not present

## 2020-12-23 DIAGNOSIS — R2689 Other abnormalities of gait and mobility: Secondary | ICD-10-CM | POA: Diagnosis not present

## 2020-12-27 ENCOUNTER — Telehealth: Payer: Self-pay | Admitting: Neurology

## 2020-12-27 NOTE — Telephone Encounter (Signed)
Called the wife back, there was no answer. Left a detailed message advising I spoke with Dr. Brett Fairy in regards to the medication increase in the p.o. versus the patch. Dr. Brett Fairy is okay with this transition. She states that she would have wanted to increase the pill form as well however the patch is more gentle on the patient and last longer since it is given every 24 hour timeframe. Left this information on the voicemail. Advised the wife to call back if she had any other questions

## 2020-12-27 NOTE — Telephone Encounter (Signed)
Wifevcalled from pharmacy asking for a call re: pt going from 5mg  a day on rivastigmine (EXELON) pill form to rivastigmine (EXELON) 9.5 mg/24hr patch if the mg will be too much for pt.  Wife was informed Myriam Jacobson, RN will speak with Dr Brett Fairy and call wife back

## 2020-12-28 DIAGNOSIS — R2689 Other abnormalities of gait and mobility: Secondary | ICD-10-CM | POA: Diagnosis not present

## 2020-12-28 DIAGNOSIS — R278 Other lack of coordination: Secondary | ICD-10-CM | POA: Diagnosis not present

## 2020-12-28 DIAGNOSIS — G510 Bell's palsy: Secondary | ICD-10-CM | POA: Diagnosis not present

## 2020-12-28 DIAGNOSIS — M6389 Disorders of muscle in diseases classified elsewhere, multiple sites: Secondary | ICD-10-CM | POA: Diagnosis not present

## 2020-12-28 DIAGNOSIS — H57811 Brow ptosis, right: Secondary | ICD-10-CM | POA: Diagnosis not present

## 2020-12-28 DIAGNOSIS — R296 Repeated falls: Secondary | ICD-10-CM | POA: Diagnosis not present

## 2020-12-28 DIAGNOSIS — S065X0S Traumatic subdural hemorrhage without loss of consciousness, sequela: Secondary | ICD-10-CM | POA: Diagnosis not present

## 2020-12-30 DIAGNOSIS — S065X0S Traumatic subdural hemorrhage without loss of consciousness, sequela: Secondary | ICD-10-CM | POA: Diagnosis not present

## 2020-12-30 DIAGNOSIS — R2689 Other abnormalities of gait and mobility: Secondary | ICD-10-CM | POA: Diagnosis not present

## 2020-12-30 DIAGNOSIS — M6389 Disorders of muscle in diseases classified elsewhere, multiple sites: Secondary | ICD-10-CM | POA: Diagnosis not present

## 2020-12-30 DIAGNOSIS — R278 Other lack of coordination: Secondary | ICD-10-CM | POA: Diagnosis not present

## 2020-12-30 DIAGNOSIS — R296 Repeated falls: Secondary | ICD-10-CM | POA: Diagnosis not present

## 2021-01-01 ENCOUNTER — Telehealth: Payer: Self-pay | Admitting: Neurology

## 2021-01-01 NOTE — Telephone Encounter (Signed)
Wife paged the on call doctor. Patient is stumbling more, saying he is dizzy. No recent changes  other than the Exelon patch, no fever or recent illnesses. No other changes, no focal weakness or other focal deficit. He is at his mental baseline. Blood pressure 170/100 and pulse 58(he has had bradycardia at baseline and has had a cardiac evaluation).  Urine is light yellow and he has been drinking fluids. I told them to take off the exelon patch and monitor. That blood pressure seems elevated based on ones we have in Epic (122/84,143/80,140/83) but wife is aware and checks it regularly.  Monitor and if he does not improve or he worsens they should take him to the emergency room or call 911.

## 2021-01-02 ENCOUNTER — Encounter: Payer: Self-pay | Admitting: Neurology

## 2021-01-03 ENCOUNTER — Telehealth: Payer: Self-pay | Admitting: Neurology

## 2021-01-03 MED ORDER — RIVASTIGMINE 4.6 MG/24HR TD PT24: 4.6000 mg | MEDICATED_PATCH | Freq: Every day | TRANSDERMAL | 12 refills | Status: DC

## 2021-01-03 NOTE — Telephone Encounter (Signed)
Agree with your advise, Berta Minor.  this may be more related to heart rate than Blood pressure given we just changed Excelon form oral to patch for GI effect/ nausea.  I am happy to reduce the dose of the patch back if heart rate is slow.

## 2021-01-03 NOTE — Telephone Encounter (Signed)
I decreased the exelon patch dose to 4.5 mg from over 9 mg due to bradycardia and dizziness. Larey Seat, MD

## 2021-01-04 DIAGNOSIS — R278 Other lack of coordination: Secondary | ICD-10-CM | POA: Diagnosis not present

## 2021-01-04 DIAGNOSIS — R2689 Other abnormalities of gait and mobility: Secondary | ICD-10-CM | POA: Diagnosis not present

## 2021-01-04 DIAGNOSIS — M6389 Disorders of muscle in diseases classified elsewhere, multiple sites: Secondary | ICD-10-CM | POA: Diagnosis not present

## 2021-01-04 DIAGNOSIS — R296 Repeated falls: Secondary | ICD-10-CM | POA: Diagnosis not present

## 2021-01-04 DIAGNOSIS — S065X0S Traumatic subdural hemorrhage without loss of consciousness, sequela: Secondary | ICD-10-CM | POA: Diagnosis not present

## 2021-01-06 ENCOUNTER — Telehealth: Payer: Self-pay | Admitting: Neurology

## 2021-01-06 DIAGNOSIS — M6389 Disorders of muscle in diseases classified elsewhere, multiple sites: Secondary | ICD-10-CM | POA: Diagnosis not present

## 2021-01-06 DIAGNOSIS — R2689 Other abnormalities of gait and mobility: Secondary | ICD-10-CM | POA: Diagnosis not present

## 2021-01-06 DIAGNOSIS — R278 Other lack of coordination: Secondary | ICD-10-CM | POA: Diagnosis not present

## 2021-01-06 DIAGNOSIS — R296 Repeated falls: Secondary | ICD-10-CM | POA: Diagnosis not present

## 2021-01-06 DIAGNOSIS — S065X0S Traumatic subdural hemorrhage without loss of consciousness, sequela: Secondary | ICD-10-CM | POA: Diagnosis not present

## 2021-01-06 NOTE — Telephone Encounter (Signed)
Patient's wife has called in to advise that he is currently 21 hours into wearing the Exelon patch at 4.6 mg.  About 4 to 5 hours ago he had a spell he began to stumble with his mobility.  She took his blood pressure and it was around 137/82 and his heart rate was around 56. He has a CNA that is with him at the moment who called to advise her that his blood pressure has increased to 157/93 and heart rate still 56.  At this point the patient appears to be stable and states he is feeling fine.  Patient was scheduled to have occupational therapy work with him today and the wife wanted to see if that was still okay.  Advised they should inform the occupational therapist about this and they may make a different call, but it should be okay for them to work with Mr. Sheaffer.  Advised to limit activities with exertion and monitor blood pressure throughout the therapy session.  Patient's wife verbalized understanding and will make sure they are aware to monitor.  Patient's wife states that she will be home at 430 this afternoon and assess the patient further.  She will contact us with an update at the end of the day.

## 2021-01-06 NOTE — Telephone Encounter (Signed)
The wife has called back and states that she is happy to report that during and after his exercise with Occupational Therapy his blood pressure and heart rate has gotten back in the normal range.  His blood pressure while working with therapy was 132/77 and heart rate was 80.  Post working with OT the blood pressure went down to 117/83 and a heart rate of 82.  Patient feels fine.  Wife states that they will plan on changing the patch this evening at the 24-hour mark and will continue to see how he does moving forward at the 4.6 mg strength.  She was appreciative for the call earlier.

## 2021-01-09 DIAGNOSIS — G4733 Obstructive sleep apnea (adult) (pediatric): Secondary | ICD-10-CM | POA: Diagnosis not present

## 2021-01-11 DIAGNOSIS — S065X1S Traumatic subdural hemorrhage with loss of consciousness of 30 minutes or less, sequela: Secondary | ICD-10-CM | POA: Diagnosis not present

## 2021-01-11 DIAGNOSIS — S065X0S Traumatic subdural hemorrhage without loss of consciousness, sequela: Secondary | ICD-10-CM | POA: Diagnosis not present

## 2021-01-11 DIAGNOSIS — R296 Repeated falls: Secondary | ICD-10-CM | POA: Diagnosis not present

## 2021-01-11 DIAGNOSIS — M6389 Disorders of muscle in diseases classified elsewhere, multiple sites: Secondary | ICD-10-CM | POA: Diagnosis not present

## 2021-01-11 DIAGNOSIS — R2689 Other abnormalities of gait and mobility: Secondary | ICD-10-CM | POA: Diagnosis not present

## 2021-01-11 DIAGNOSIS — R278 Other lack of coordination: Secondary | ICD-10-CM | POA: Diagnosis not present

## 2021-01-13 DIAGNOSIS — R296 Repeated falls: Secondary | ICD-10-CM | POA: Diagnosis not present

## 2021-01-13 DIAGNOSIS — R278 Other lack of coordination: Secondary | ICD-10-CM | POA: Diagnosis not present

## 2021-01-13 DIAGNOSIS — M6389 Disorders of muscle in diseases classified elsewhere, multiple sites: Secondary | ICD-10-CM | POA: Diagnosis not present

## 2021-01-13 DIAGNOSIS — F039 Unspecified dementia: Secondary | ICD-10-CM | POA: Diagnosis not present

## 2021-01-13 DIAGNOSIS — S065X0S Traumatic subdural hemorrhage without loss of consciousness, sequela: Secondary | ICD-10-CM | POA: Diagnosis not present

## 2021-01-13 DIAGNOSIS — S065X1S Traumatic subdural hemorrhage with loss of consciousness of 30 minutes or less, sequela: Secondary | ICD-10-CM | POA: Diagnosis not present

## 2021-01-13 DIAGNOSIS — R2689 Other abnormalities of gait and mobility: Secondary | ICD-10-CM | POA: Diagnosis not present

## 2021-01-18 DIAGNOSIS — S065X0S Traumatic subdural hemorrhage without loss of consciousness, sequela: Secondary | ICD-10-CM | POA: Diagnosis not present

## 2021-01-18 DIAGNOSIS — R296 Repeated falls: Secondary | ICD-10-CM | POA: Diagnosis not present

## 2021-01-18 DIAGNOSIS — R2689 Other abnormalities of gait and mobility: Secondary | ICD-10-CM | POA: Diagnosis not present

## 2021-01-18 DIAGNOSIS — R278 Other lack of coordination: Secondary | ICD-10-CM | POA: Diagnosis not present

## 2021-01-18 DIAGNOSIS — M6389 Disorders of muscle in diseases classified elsewhere, multiple sites: Secondary | ICD-10-CM | POA: Diagnosis not present

## 2021-01-18 DIAGNOSIS — S065X1S Traumatic subdural hemorrhage with loss of consciousness of 30 minutes or less, sequela: Secondary | ICD-10-CM | POA: Diagnosis not present

## 2021-01-20 DIAGNOSIS — R278 Other lack of coordination: Secondary | ICD-10-CM | POA: Diagnosis not present

## 2021-01-20 DIAGNOSIS — R296 Repeated falls: Secondary | ICD-10-CM | POA: Diagnosis not present

## 2021-01-20 DIAGNOSIS — S065X1S Traumatic subdural hemorrhage with loss of consciousness of 30 minutes or less, sequela: Secondary | ICD-10-CM | POA: Diagnosis not present

## 2021-01-20 DIAGNOSIS — M6389 Disorders of muscle in diseases classified elsewhere, multiple sites: Secondary | ICD-10-CM | POA: Diagnosis not present

## 2021-01-20 DIAGNOSIS — S065X0S Traumatic subdural hemorrhage without loss of consciousness, sequela: Secondary | ICD-10-CM | POA: Diagnosis not present

## 2021-01-20 DIAGNOSIS — R2689 Other abnormalities of gait and mobility: Secondary | ICD-10-CM | POA: Diagnosis not present

## 2021-02-04 ENCOUNTER — Encounter: Payer: Self-pay | Admitting: Neurology

## 2021-02-06 DIAGNOSIS — G4733 Obstructive sleep apnea (adult) (pediatric): Secondary | ICD-10-CM | POA: Diagnosis not present

## 2021-02-07 ENCOUNTER — Telehealth: Payer: Self-pay | Admitting: Neurology

## 2021-02-07 NOTE — Telephone Encounter (Signed)
Katie an [employer] manager for [employer] called needing to speak to the RN or Provider about getting additional information on this pt. Please advise.

## 2021-02-07 NOTE — Telephone Encounter (Signed)
Called the OT personnel back and there was no answer. LVM advising to call back. Also received paperwork on the patient from her to review and document and send back for the patient.

## 2021-02-15 DIAGNOSIS — H57811 Brow ptosis, right: Secondary | ICD-10-CM | POA: Diagnosis not present

## 2021-02-17 DIAGNOSIS — F039 Unspecified dementia: Secondary | ICD-10-CM | POA: Diagnosis not present

## 2021-02-17 DIAGNOSIS — R296 Repeated falls: Secondary | ICD-10-CM | POA: Diagnosis not present

## 2021-02-18 DIAGNOSIS — G4733 Obstructive sleep apnea (adult) (pediatric): Secondary | ICD-10-CM | POA: Diagnosis not present

## 2021-02-23 ENCOUNTER — Other Ambulatory Visit: Payer: PPO

## 2021-02-24 DIAGNOSIS — R278 Other lack of coordination: Secondary | ICD-10-CM | POA: Diagnosis not present

## 2021-02-24 DIAGNOSIS — M6389 Disorders of muscle in diseases classified elsewhere, multiple sites: Secondary | ICD-10-CM | POA: Diagnosis not present

## 2021-02-24 DIAGNOSIS — S065X1S Traumatic subdural hemorrhage with loss of consciousness of 30 minutes or less, sequela: Secondary | ICD-10-CM | POA: Diagnosis not present

## 2021-02-24 DIAGNOSIS — R2689 Other abnormalities of gait and mobility: Secondary | ICD-10-CM | POA: Diagnosis not present

## 2021-02-24 DIAGNOSIS — R296 Repeated falls: Secondary | ICD-10-CM | POA: Diagnosis not present

## 2021-02-28 DIAGNOSIS — R2689 Other abnormalities of gait and mobility: Secondary | ICD-10-CM | POA: Diagnosis not present

## 2021-02-28 DIAGNOSIS — M6389 Disorders of muscle in diseases classified elsewhere, multiple sites: Secondary | ICD-10-CM | POA: Diagnosis not present

## 2021-02-28 DIAGNOSIS — R278 Other lack of coordination: Secondary | ICD-10-CM | POA: Diagnosis not present

## 2021-02-28 DIAGNOSIS — R296 Repeated falls: Secondary | ICD-10-CM | POA: Diagnosis not present

## 2021-02-28 DIAGNOSIS — S065X1S Traumatic subdural hemorrhage with loss of consciousness of 30 minutes or less, sequela: Secondary | ICD-10-CM | POA: Diagnosis not present

## 2021-03-03 DIAGNOSIS — F0280 Dementia in other diseases classified elsewhere without behavioral disturbance: Secondary | ICD-10-CM | POA: Diagnosis not present

## 2021-03-03 DIAGNOSIS — G301 Alzheimer's disease with late onset: Secondary | ICD-10-CM | POA: Diagnosis not present

## 2021-03-03 DIAGNOSIS — R54 Age-related physical debility: Secondary | ICD-10-CM | POA: Diagnosis not present

## 2021-03-05 ENCOUNTER — Encounter (HOSPITAL_BASED_OUTPATIENT_CLINIC_OR_DEPARTMENT_OTHER): Payer: Self-pay | Admitting: Emergency Medicine

## 2021-03-05 ENCOUNTER — Other Ambulatory Visit: Payer: Self-pay

## 2021-03-05 ENCOUNTER — Emergency Department (HOSPITAL_BASED_OUTPATIENT_CLINIC_OR_DEPARTMENT_OTHER): Payer: PPO | Admitting: Radiology

## 2021-03-05 ENCOUNTER — Emergency Department (HOSPITAL_BASED_OUTPATIENT_CLINIC_OR_DEPARTMENT_OTHER)
Admission: EM | Admit: 2021-03-05 | Discharge: 2021-03-05 | Disposition: A | Discharge status: Home / Self Care | Payer: PPO | Attending: Emergency Medicine | Admitting: Emergency Medicine

## 2021-03-05 ENCOUNTER — Emergency Department (HOSPITAL_BASED_OUTPATIENT_CLINIC_OR_DEPARTMENT_OTHER): Payer: PPO

## 2021-03-05 DIAGNOSIS — W182XXA Fall in (into) shower or empty bathtub, initial encounter: Secondary | ICD-10-CM | POA: Insufficient documentation

## 2021-03-05 DIAGNOSIS — Z20822 Contact with and (suspected) exposure to covid-19: Secondary | ICD-10-CM | POA: Insufficient documentation

## 2021-03-05 DIAGNOSIS — Z87891 Personal history of nicotine dependence: Secondary | ICD-10-CM | POA: Insufficient documentation

## 2021-03-05 DIAGNOSIS — Y92012 Bathroom of single-family (private) house as the place of occurrence of the external cause: Secondary | ICD-10-CM | POA: Insufficient documentation

## 2021-03-05 DIAGNOSIS — R531 Weakness: Secondary | ICD-10-CM

## 2021-03-05 DIAGNOSIS — G309 Alzheimer's disease, unspecified: Secondary | ICD-10-CM | POA: Insufficient documentation

## 2021-03-05 DIAGNOSIS — R4182 Altered mental status, unspecified: Secondary | ICD-10-CM | POA: Diagnosis not present

## 2021-03-05 DIAGNOSIS — R197 Diarrhea, unspecified: Secondary | ICD-10-CM

## 2021-03-05 DIAGNOSIS — Z85828 Personal history of other malignant neoplasm of skin: Secondary | ICD-10-CM | POA: Diagnosis not present

## 2021-03-05 DIAGNOSIS — I10 Essential (primary) hypertension: Secondary | ICD-10-CM | POA: Diagnosis not present

## 2021-03-05 DIAGNOSIS — Z79899 Other long term (current) drug therapy: Secondary | ICD-10-CM | POA: Insufficient documentation

## 2021-03-05 LAB — URINALYSIS, ROUTINE W REFLEX MICROSCOPIC
Bilirubin Urine: NEGATIVE
Glucose, UA: NEGATIVE mg/dL
Hgb urine dipstick: NEGATIVE
Ketones, ur: NEGATIVE mg/dL
Leukocytes,Ua: NEGATIVE
Nitrite: NEGATIVE
Protein, ur: 30 mg/dL — AB
Specific Gravity, Urine: 1.031 — ABNORMAL HIGH (ref 1.005–1.030)
pH: 5 (ref 5.0–8.0)

## 2021-03-05 LAB — BASIC METABOLIC PANEL
Anion gap: 7 (ref 5–15)
BUN: 22 mg/dL (ref 8–23)
CO2: 26 mmol/L (ref 22–32)
Calcium: 8.8 mg/dL — ABNORMAL LOW (ref 8.9–10.3)
Chloride: 103 mmol/L (ref 98–111)
Creatinine, Ser: 1.03 mg/dL (ref 0.61–1.24)
GFR, Estimated: >60 mL/min (ref >60)
Glucose, Bld: 129 mg/dL — ABNORMAL HIGH (ref 70–99)
Potassium: 3.9 mmol/L (ref 3.5–5.1)
Sodium: 136 mmol/L (ref 135–145)

## 2021-03-05 LAB — HEPATIC FUNCTION PANEL
ALT: 16 U/L (ref 0–44)
AST: 16 U/L (ref 15–41)
Albumin: 4.1 g/dL (ref 3.5–5.0)
Alkaline Phosphatase: 50 U/L (ref 38–126)
Bilirubin, Direct: 0.2 mg/dL (ref 0.0–0.2)
Indirect Bilirubin: 0.9 mg/dL (ref 0.3–0.9)
Total Bilirubin: 1.1 mg/dL (ref 0.3–1.2)
Total Protein: 6.3 g/dL — ABNORMAL LOW (ref 6.5–8.1)

## 2021-03-05 LAB — CBC WITH DIFFERENTIAL/PLATELET
Abs Immature Granulocytes: 0.02 10*3/uL (ref 0.00–0.07)
Basophils Absolute: 0 10*3/uL (ref 0.0–0.1)
Basophils Relative: 0 %
Eosinophils Absolute: 0 10*3/uL (ref 0.0–0.5)
Eosinophils Relative: 0 %
HCT: 47.5 % (ref 39.0–52.0)
Hemoglobin: 15.8 g/dL (ref 13.0–17.0)
Immature Granulocytes: 0 %
Lymphocytes Relative: 6 %
Lymphs Abs: 0.5 10*3/uL — ABNORMAL LOW (ref 0.7–4.0)
MCH: 30.2 pg (ref 26.0–34.0)
MCHC: 33.3 g/dL (ref 30.0–36.0)
MCV: 90.8 fL (ref 80.0–100.0)
Monocytes Absolute: 0.4 10*3/uL (ref 0.1–1.0)
Monocytes Relative: 5 %
Neutro Abs: 7.5 10*3/uL (ref 1.7–7.7)
Neutrophils Relative %: 89 %
Platelets: 152 10*3/uL (ref 150–400)
RBC: 5.23 MIL/uL (ref 4.22–5.81)
RDW: 13.3 % (ref 11.5–15.5)
WBC: 8.5 10*3/uL (ref 4.0–10.5)
nRBC: 0 % (ref 0.0–0.2)

## 2021-03-05 LAB — RESP PANEL BY RT-PCR (FLU A&B, COVID) ARPGX2
Influenza A by PCR: NEGATIVE
Influenza B by PCR: NEGATIVE
SARS Coronavirus 2 by RT PCR: NEGATIVE

## 2021-03-05 LAB — LIPASE, BLOOD: Lipase: 16 U/L (ref 11–51)

## 2021-03-05 LAB — LACTIC ACID, PLASMA: Lactic Acid, Venous: 1 mmol/L (ref 0.5–1.9)

## 2021-03-05 MED ORDER — ACETAMINOPHEN 325 MG PO TABS
650.0000 mg | ORAL_TABLET | Freq: Once | ORAL | Status: AC
  Administered 2021-03-05: 650 mg via ORAL
  Filled 2021-03-05: qty 2

## 2021-03-05 MED ORDER — SODIUM CHLORIDE 0.9 % IV BOLUS
1000.0000 mL | Freq: Once | INTRAVENOUS | Status: AC
  Administered 2021-03-05: 1000 mL via INTRAVENOUS

## 2021-03-05 NOTE — ED Triage Notes (Signed)
Pt was in the shower yesterday evening. Pt's wife found him slumped in the shower. Pt does not remember anything. Pt and wife arent sure what he hit on the way down, but only abrasion is to left leg. Neurologically intact. Right eye ptosis is being treated elsewhere. Pt had fever of 100 last night, diarrhea. pts wife had just gotten over a virus.

## 2021-03-05 NOTE — ED Provider Notes (Signed)
[facility] EMERGENCY DEPT Provider Note   CSN: [id number] Arrival date & time: 03/05/21  6734     History Chief Complaint  Patient presents with  . Fall    Jeffrey [name] is a 80 y.o. male.  Patient had fall yesterday while in the shower.  Had an episode of diarrhea.  Wife has been sick with similar symptoms as well.  History of syncope and Alzheimer's.  Patient seems little bit more lethargic than normal per wife but patient denies any pain.  He is not on blood thinners.  Overall wife just wanted him checked out because he had not quite been himself since the fall.  Has a history of head bleeds.  The history is provided by the patient and a caregiver.  Fall This is a new problem. The current episode started yesterday. The problem has been resolved. Pertinent negatives include no chest pain, no abdominal pain, no headaches and no shortness of breath. Nothing aggravates the symptoms. Nothing relieves the symptoms. He has tried nothing for the symptoms. The treatment provided no relief.       Past Medical History:  Diagnosis Date  . Colon polyps   . Diverticulosis   . Hypercholesterolemia   . Hypertension   . Internal hemorrhoids     Patient Active Problem List   Diagnosis Date Noted  . PSVT (paroxysmal supraventricular tachycardia) ([place]) 12/22/2020  . Subdural hematoma ([place]) 09/30/2020  . SAH (subarachnoid hemorrhage) ([place]) 09/30/2020  . Amnestic MCI (mild cognitive impairment with memory loss) 02/17/2020  . Syncope and collapse 02/17/2020  . Bradycardia on ECG 02/17/2020  . Hypersomnia with sleep apnea 02/17/2020  . Alzheimer disease ([place]) 02/17/2020  . Carotid artery disease ([place]) 11/28/2016  . HYPERGLYCEMIA 11/10/2008  . LOCALIZED SUPERFICIAL SWELLING MASS OR LUMP 03/31/2008  . MELANOMA OF SKIN, SITE UNSPECIFIED 02/11/2008  . HYPERLIPIDEMIA 02/11/2008  . HYPERTENSION, ESSENTIAL NOS 02/11/2008  . HYPERPLASIA PROSTATE UNS W/O UR OBST & OTH LUTS  02/11/2008  . SKIN CANCER, HX OF 02/11/2008  . GEN OSTEOARTHROSIS INVOLVING MULTIPLE SITES 10/03/2007  . PES PLANUS 10/03/2007    Past Surgical History:  Procedure Laterality Date  . EYE SURGERY     X 4  . HERNIA REPAIR  2006   abdominal  . SKIN CANCER EXCISION     shoulder, basal cell  . SQUAMOUS CELL CARCINOMA EXCISION     preauricular on the right  . TONSILLECTOMY  1947  . VASECTOMY  1981       Family History  Problem Relation Age of Onset  . Heart disease Father   . Heart attack Father   . Alzheimer's disease Mother   . Heart failure Brother   . Colon cancer Neg Hx   . Stomach cancer Neg Hx   . Esophageal cancer Neg Hx   . Rectal cancer Neg Hx   . Liver cancer Neg Hx     Social History   Tobacco Use  . Smoking status: Former Smoker    Packs/day: 0.25    Years: 3.00    Pack years: 0.75    Types: Cigarettes    Quit date: 08/21/1966    Years since quitting: 54.5  . Smokeless tobacco: Never Used  Vaping Use  . Vaping Use: Never used  Substance Use Topics  . Alcohol use: Yes    Comment: limited  . Drug use: No    Home Medications Prior to Admission medications   Medication Sig Start Date End Date Taking? Authorizing Provider  atorvastatin (LIPITOR) 20 MG tablet Take 20 mg by mouth daily at 6 PM.     [provider]  citalopram (CELEXA) 10 MG tablet Take 1 tablet (10 mg total) by mouth daily. 12/22/20   Dohmeier, Asencion Partridge, MD  Multiple Vitamin (MULTIVITAMIN) tablet Take 1 tablet by mouth daily.    [provider]  niacinamide 500 MG tablet Take 500 mg by mouth 2 (two) times daily with a meal.    [provider]  ramipril (ALTACE) 2.5 MG capsule Take 2.5 mg by mouth daily. 11/11/19   [provider]  rivastigmine (EXELON) 4.6 mg/24hr Place 1 patch (4.6 mg total) onto the skin daily. 01/03/21   Dohmeier, Asencion Partridge, MD    Allergies    Other  Review of Systems   Review of Systems  Constitutional: Negative for chills and fever.   HENT: Negative for ear pain and sore throat.   Eyes: Negative for pain and visual disturbance.  Respiratory: Negative for cough and shortness of breath.   Cardiovascular: Negative for chest pain and palpitations.  Gastrointestinal: Positive for diarrhea. Negative for abdominal pain, nausea and vomiting.  Genitourinary: Negative for dysuria and hematuria.  Musculoskeletal: Negative for arthralgias and back pain.  Skin: Negative for color change and rash.  Neurological: Negative for seizures, syncope and headaches.  All other systems reviewed and are negative.   Physical Exam Updated Vital Signs BP 133/79   Pulse 64   Temp 100.3 F (37.9 C) (Rectal)   Resp 19   Ht 6' (1.829 m)   Wt 77.8 kg   SpO2 97%   BMI 23.27 kg/m   Physical Exam Vitals and nursing note reviewed.  Constitutional:      General: He is not in acute distress.    Appearance: He is well-developed. He is not ill-appearing.  HENT:     Head: Normocephalic and atraumatic.  Eyes:     Extraocular Movements: Extraocular movements intact.     Conjunctiva/sclera: Conjunctivae normal.     Pupils: Pupils are equal, round, and reactive to light.  Cardiovascular:     Rate and Rhythm: Normal rate and regular rhythm.     Heart sounds: No murmur heard.   Pulmonary:     Effort: Pulmonary effort is normal. No respiratory distress.     Breath sounds: Normal breath sounds.  Abdominal:     Palpations: Abdomen is soft.     Tenderness: There is no abdominal tenderness.  Musculoskeletal:     Cervical back: Normal range of motion and neck supple. No tenderness.  Skin:    General: Skin is warm and dry.  Neurological:     General: No focal deficit present.     Mental Status: He is alert.     Sensory: No sensory deficit.     Motor: No weakness.     Comments: Grossly normal strength and sensation, normal speech, pleasant     ED Results / Procedures / Treatments   Labs (all labs ordered are listed, but only abnormal  results are displayed) Labs Reviewed  CBC WITH DIFFERENTIAL/PLATELET - Abnormal; Notable for the following components:      Result Value   Lymphs Abs 0.5 (*)    All other components within normal limits  BASIC METABOLIC PANEL - Abnormal; Notable for the following components:   Glucose, Bld 129 (*)    Calcium 8.8 (*)    All other components within normal limits  URINALYSIS, ROUTINE W REFLEX MICROSCOPIC - Abnormal; Notable for the following  components:   Specific Gravity, Urine 1.031 (*)    Protein, ur 30 (*)    All other components within normal limits  HEPATIC FUNCTION PANEL - Abnormal; Notable for the following components:   Total Protein 6.3 (*)    All other components within normal limits  RESP PANEL BY RT-PCR (FLU A&B, COVID) ARPGX2  CULTURE, BLOOD (SINGLE)  URINE CULTURE  CULTURE, BLOOD (SINGLE)  LACTIC ACID, PLASMA  LIPASE, BLOOD    EKG EKG Interpretation  Date/Time:  Saturday March 05 2021 09:41:33 EDT Ventricular Rate:  79 PR Interval:  180 QRS Duration: 129 QT Interval:  411 QTC Calculation: 472 R Axis:   -24 Text Interpretation: Sinus rhythm Left bundle branch block Confirmed by Lennice Sites ([id number]) on 03/05/2021 9:43:38 AM   Radiology DG Chest 2 View  Result Date: 03/05/2021 CLINICAL DATA:  Weakness. EXAM: CHEST - 2 VIEW COMPARISON:  None. FINDINGS: Heart size is normal. No pleural effusion or edema. No airspace opacities identified. The visualized osseous structures are unremarkable. Surgical clips identified within the right axilla. IMPRESSION: No active cardiopulmonary abnormalities. Electronically Signed   By: Kerby Moors M.D.   On: 03/05/2021 11:11   CT Head Wo Contrast  Result Date: 03/05/2021 CLINICAL DATA:  Found slumped over in shower. Altered mental status. EXAM: CT HEAD WITHOUT CONTRAST CT CERVICAL SPINE WITHOUT CONTRAST TECHNIQUE: Multidetector CT imaging of the head and cervical spine was performed following the standard protocol without  intravenous contrast. Multiplanar CT image reconstructions of the cervical spine were also generated. COMPARISON:  06/03/2019 FINDINGS: CT HEAD FINDINGS Brain: No evidence of acute infarction, hemorrhage, hydrocephalus, extra-axial collection or mass lesion/mass effect. Cerebral volume loss. Chronic small vessel ischemia in the cerebral white matter. Vascular: No hyperdense vessel or unexpected calcification. Skull: No acute or aggressive finding. Sinuses/Orbits: Partial right mastoid opacification, possibly radiation related. Postoperative right infratemporal fossa/parotid region with associated gold weight. There is history of squamous cell carcinoma in this region. CT CERVICAL SPINE FINDINGS Alignment: No traumatic malalignment. Skull base and vertebrae: No acute fracture or evidence of bone lesion Soft tissues and spinal canal: Negative Disc levels: Degenerative facet and endplate spurring. C2-3 facet ankylosis. Diffuse patent appearance of the spinal canal. C6-7 central disc protrusion. Upper chest: Negative IMPRESSION: 1. No acute intracranial or cervical spine finding. 2. Aging brain. 3. Stable postoperative right parotid region. Electronically Signed   By: Monte Fantasia M.D.   On: 03/05/2021 11:11   CT Cervical Spine Wo Contrast  Result Date: 03/05/2021 CLINICAL DATA:  Found slumped over in shower. Altered mental status. EXAM: CT HEAD WITHOUT CONTRAST CT CERVICAL SPINE WITHOUT CONTRAST TECHNIQUE: Multidetector CT imaging of the head and cervical spine was performed following the standard protocol without intravenous contrast. Multiplanar CT image reconstructions of the cervical spine were also generated. COMPARISON:  06/03/2019 FINDINGS: CT HEAD FINDINGS Brain: No evidence of acute infarction, hemorrhage, hydrocephalus, extra-axial collection or mass lesion/mass effect. Cerebral volume loss. Chronic small vessel ischemia in the cerebral white matter. Vascular: No hyperdense vessel or unexpected  calcification. Skull: No acute or aggressive finding. Sinuses/Orbits: Partial right mastoid opacification, possibly radiation related. Postoperative right infratemporal fossa/parotid region with associated gold weight. There is history of squamous cell carcinoma in this region. CT CERVICAL SPINE FINDINGS Alignment: No traumatic malalignment. Skull base and vertebrae: No acute fracture or evidence of bone lesion Soft tissues and spinal canal: Negative Disc levels: Degenerative facet and endplate spurring. C2-3 facet ankylosis. Diffuse patent appearance of the spinal canal. C6-7 central disc  protrusion. Upper chest: Negative IMPRESSION: 1. No acute intracranial or cervical spine finding. 2. Aging brain. 3. Stable postoperative right parotid region. Electronically Signed   By: Monte Fantasia M.D.   On: 03/05/2021 11:11    Procedures Procedures   Medications Ordered in ED Medications  sodium chloride 0.9 % bolus 1,000 mL (1,000 mLs Intravenous New Bag/Given 03/05/21 1031)  acetaminophen (TYLENOL) tablet 650 mg (650 mg Oral Given 03/05/21 1103)    ED Course  I have reviewed the triage vital signs and the nursing notes.  Pertinent labs & imaging results that were available during my care of the patient were reviewed by me and considered in my medical decision making (see chart for details).    MDM Rules/Calculators/A&P                          GERHARDT HUAMAN is here after fall yesterday.  History of hypertension, dementia, head bleed.  Low-grade temperature of 100.1.  Oxygen in the low 90s.  Patient had fall yesterday while in shower.  Had an episode of diarrhea during that time.  Wife has been sick with nausea and diarrhea as well.  Patient overall has no complaints.  Wife states that he has not quite been the same since the fall yesterday.  She concerned because he has a history of head bleeds.  We will get a head and neck CT.  Will check infectious labs given fever, however suspect possibly viral  process given diarrhea and wife with similar symptoms.  Will give fluid bolus.  Overall EKG shows sinus rhythm.  Neurologically he looks intact.  He does have history of aspiration.  Head and neck CT are unremarkable.  Chest x-ray no evidence of infection.  Urinalysis no evidence of infection.  Gallbladder, liver enzymes unremarkable.  Lipase normal.  Doubt intra-abdominal infection.  Not having any abdominal tenderness.  No leukocytosis, no lactic acidosis.  Influenza and COVID test are negative.  Overall suspect viral GI process.  Patient has appeared very well.  Normal vitals.  Family given reassurance and return precautions.  Discharged in good condition.  This chart was dictated using voice recognition software.  Despite best efforts to proofread,  errors can occur which can change the documentation meaning.    Final Clinical Impression(s) / ED Diagnoses Final diagnoses:  Weakness  Diarrhea, unspecified type    Rx / DC Orders ED Discharge Orders    None       Lennice Sites, DO 03/05/21 1220

## 2021-03-05 NOTE — ED Notes (Signed)
Unsuccessful iv attempt in right forearm and left forearm.

## 2021-03-05 NOTE — ED Notes (Signed)
Attempted to obtain IV access x1 w/o success on patient's right forearm. Patient was on 2 L  and MD decreased patient to RA; patient not on O2 at home. O2 sats 94% on RA. BBS clear.

## 2021-03-06 LAB — URINE CULTURE: Culture: NO GROWTH

## 2021-03-09 DIAGNOSIS — G4733 Obstructive sleep apnea (adult) (pediatric): Secondary | ICD-10-CM | POA: Diagnosis not present

## 2021-03-10 DIAGNOSIS — R21 Rash and other nonspecific skin eruption: Secondary | ICD-10-CM | POA: Diagnosis not present

## 2021-03-10 LAB — CULTURE, BLOOD (SINGLE)
Culture: NO GROWTH
Culture: NO GROWTH
Special Requests: ADEQUATE
Special Requests: ADEQUATE

## 2021-03-10 NOTE — Telephone Encounter (Signed)
Please cancel his future visits

## 2021-03-11 ENCOUNTER — Other Ambulatory Visit: Payer: PPO

## 2021-03-22 DIAGNOSIS — R296 Repeated falls: Secondary | ICD-10-CM | POA: Diagnosis not present

## 2021-03-22 DIAGNOSIS — R278 Other lack of coordination: Secondary | ICD-10-CM | POA: Diagnosis not present

## 2021-03-22 DIAGNOSIS — S065X1S Traumatic subdural hemorrhage with loss of consciousness of 30 minutes or less, sequela: Secondary | ICD-10-CM | POA: Diagnosis not present

## 2021-03-22 DIAGNOSIS — F039 Unspecified dementia: Secondary | ICD-10-CM | POA: Diagnosis not present

## 2021-03-22 DIAGNOSIS — M6389 Disorders of muscle in diseases classified elsewhere, multiple sites: Secondary | ICD-10-CM | POA: Diagnosis not present

## 2021-03-22 DIAGNOSIS — R2689 Other abnormalities of gait and mobility: Secondary | ICD-10-CM | POA: Diagnosis not present

## 2021-03-23 DIAGNOSIS — C44619 Basal cell carcinoma of skin of left upper limb, including shoulder: Secondary | ICD-10-CM | POA: Diagnosis not present

## 2021-03-23 DIAGNOSIS — L570 Actinic keratosis: Secondary | ICD-10-CM | POA: Diagnosis not present

## 2021-03-25 ENCOUNTER — Ambulatory Visit: Payer: PPO | Admitting: Cardiology

## 2021-03-29 ENCOUNTER — Other Ambulatory Visit: Payer: PPO

## 2021-03-31 DIAGNOSIS — R278 Other lack of coordination: Secondary | ICD-10-CM | POA: Diagnosis not present

## 2021-03-31 DIAGNOSIS — R2689 Other abnormalities of gait and mobility: Secondary | ICD-10-CM | POA: Diagnosis not present

## 2021-03-31 DIAGNOSIS — M6389 Disorders of muscle in diseases classified elsewhere, multiple sites: Secondary | ICD-10-CM | POA: Diagnosis not present

## 2021-03-31 DIAGNOSIS — R296 Repeated falls: Secondary | ICD-10-CM | POA: Diagnosis not present

## 2021-03-31 DIAGNOSIS — S065X1S Traumatic subdural hemorrhage with loss of consciousness of 30 minutes or less, sequela: Secondary | ICD-10-CM | POA: Diagnosis not present

## 2021-04-08 DIAGNOSIS — G4733 Obstructive sleep apnea (adult) (pediatric): Secondary | ICD-10-CM | POA: Diagnosis not present

## 2021-04-10 DIAGNOSIS — S0181XA Laceration without foreign body of other part of head, initial encounter: Secondary | ICD-10-CM | POA: Diagnosis not present

## 2021-04-10 DIAGNOSIS — F0390 Unspecified dementia without behavioral disturbance: Secondary | ICD-10-CM | POA: Diagnosis not present

## 2021-04-10 DIAGNOSIS — W1811XA Fall from or off toilet without subsequent striking against object, initial encounter: Secondary | ICD-10-CM | POA: Diagnosis not present

## 2021-04-10 DIAGNOSIS — S199XXA Unspecified injury of neck, initial encounter: Secondary | ICD-10-CM | POA: Diagnosis not present

## 2021-04-10 DIAGNOSIS — I10 Essential (primary) hypertension: Secondary | ICD-10-CM | POA: Diagnosis not present

## 2021-04-10 DIAGNOSIS — S0990XA Unspecified injury of head, initial encounter: Secondary | ICD-10-CM | POA: Diagnosis not present

## 2021-04-10 DIAGNOSIS — Y9289 Other specified places as the place of occurrence of the external cause: Secondary | ICD-10-CM | POA: Diagnosis not present

## 2021-04-19 ENCOUNTER — Other Ambulatory Visit: Payer: Self-pay

## 2021-04-19 ENCOUNTER — Ambulatory Visit
Admission: RE | Admit: 2021-04-19 | Discharge: 2021-04-19 | Disposition: A | Discharge status: Home / Self Care | Payer: PPO | Source: Ambulatory Visit | Attending: Internal Medicine | Admitting: Internal Medicine

## 2021-04-19 DIAGNOSIS — F039 Unspecified dementia: Secondary | ICD-10-CM | POA: Diagnosis not present

## 2021-04-19 DIAGNOSIS — M85852 Other specified disorders of bone density and structure, left thigh: Secondary | ICD-10-CM | POA: Diagnosis not present

## 2021-04-19 DIAGNOSIS — R296 Repeated falls: Secondary | ICD-10-CM | POA: Diagnosis not present

## 2021-04-19 DIAGNOSIS — M8580 Other specified disorders of bone density and structure, unspecified site: Secondary | ICD-10-CM

## 2021-04-20 DIAGNOSIS — Z125 Encounter for screening for malignant neoplasm of prostate: Secondary | ICD-10-CM | POA: Diagnosis not present

## 2021-04-20 DIAGNOSIS — Z0000 Encounter for general adult medical examination without abnormal findings: Secondary | ICD-10-CM | POA: Diagnosis not present

## 2021-04-20 DIAGNOSIS — E785 Hyperlipidemia, unspecified: Secondary | ICD-10-CM | POA: Diagnosis not present

## 2021-04-20 DIAGNOSIS — E291 Testicular hypofunction: Secondary | ICD-10-CM | POA: Diagnosis not present

## 2021-04-20 DIAGNOSIS — R7301 Impaired fasting glucose: Secondary | ICD-10-CM | POA: Diagnosis not present

## 2021-04-28 DIAGNOSIS — M6389 Disorders of muscle in diseases classified elsewhere, multiple sites: Secondary | ICD-10-CM | POA: Diagnosis not present

## 2021-04-28 DIAGNOSIS — G4733 Obstructive sleep apnea (adult) (pediatric): Secondary | ICD-10-CM | POA: Diagnosis not present

## 2021-04-28 DIAGNOSIS — M8580 Other specified disorders of bone density and structure, unspecified site: Secondary | ICD-10-CM | POA: Diagnosis not present

## 2021-04-28 DIAGNOSIS — R278 Other lack of coordination: Secondary | ICD-10-CM | POA: Diagnosis not present

## 2021-04-28 DIAGNOSIS — R82998 Other abnormal findings in urine: Secondary | ICD-10-CM | POA: Diagnosis not present

## 2021-04-28 DIAGNOSIS — I129 Hypertensive chronic kidney disease with stage 1 through stage 4 chronic kidney disease, or unspecified chronic kidney disease: Secondary | ICD-10-CM | POA: Diagnosis not present

## 2021-04-28 DIAGNOSIS — I2510 Atherosclerotic heart disease of native coronary artery without angina pectoris: Secondary | ICD-10-CM | POA: Diagnosis not present

## 2021-04-28 DIAGNOSIS — E785 Hyperlipidemia, unspecified: Secondary | ICD-10-CM | POA: Diagnosis not present

## 2021-04-28 DIAGNOSIS — S065X1S Traumatic subdural hemorrhage with loss of consciousness of 30 minutes or less, sequela: Secondary | ICD-10-CM | POA: Diagnosis not present

## 2021-04-28 DIAGNOSIS — R296 Repeated falls: Secondary | ICD-10-CM | POA: Diagnosis not present

## 2021-04-28 DIAGNOSIS — R269 Unspecified abnormalities of gait and mobility: Secondary | ICD-10-CM | POA: Diagnosis not present

## 2021-04-28 DIAGNOSIS — I6529 Occlusion and stenosis of unspecified carotid artery: Secondary | ICD-10-CM | POA: Diagnosis not present

## 2021-04-28 DIAGNOSIS — Z0000 Encounter for general adult medical examination without abnormal findings: Secondary | ICD-10-CM | POA: Diagnosis not present

## 2021-04-28 DIAGNOSIS — I6389 Other cerebral infarction: Secondary | ICD-10-CM | POA: Diagnosis not present

## 2021-04-28 DIAGNOSIS — R2689 Other abnormalities of gait and mobility: Secondary | ICD-10-CM | POA: Diagnosis not present

## 2021-04-28 DIAGNOSIS — I10 Essential (primary) hypertension: Secondary | ICD-10-CM | POA: Diagnosis not present

## 2021-04-28 DIAGNOSIS — I700 Atherosclerosis of aorta: Secondary | ICD-10-CM | POA: Diagnosis not present

## 2021-04-28 DIAGNOSIS — G300 Alzheimer's disease with early onset: Secondary | ICD-10-CM | POA: Diagnosis not present

## 2021-05-11 DIAGNOSIS — C44619 Basal cell carcinoma of skin of left upper limb, including shoulder: Secondary | ICD-10-CM | POA: Diagnosis not present

## 2021-05-11 DIAGNOSIS — L905 Scar conditions and fibrosis of skin: Secondary | ICD-10-CM | POA: Diagnosis not present

## 2021-05-17 ENCOUNTER — Telehealth: Payer: Self-pay | Admitting: Cardiology

## 2021-05-17 NOTE — Telephone Encounter (Signed)
Do not need the carotid and can see PRN only. Do not need to be seen unless they request

## 2021-05-19 DIAGNOSIS — F039 Unspecified dementia: Secondary | ICD-10-CM | POA: Diagnosis not present

## 2021-05-19 DIAGNOSIS — R296 Repeated falls: Secondary | ICD-10-CM | POA: Diagnosis not present

## 2021-05-19 DIAGNOSIS — G4733 Obstructive sleep apnea (adult) (pediatric): Secondary | ICD-10-CM | POA: Diagnosis not present

## 2021-05-22 ENCOUNTER — Encounter: Payer: Self-pay | Admitting: Neurology

## 2021-05-25 ENCOUNTER — Encounter: Payer: Self-pay | Admitting: Neurology

## 2021-05-25 ENCOUNTER — Other Ambulatory Visit: Payer: Self-pay

## 2021-05-25 ENCOUNTER — Ambulatory Visit: Payer: PPO | Admitting: Neurology

## 2021-05-25 VITALS — BP 120/71 | HR 71 | Ht 72.0 in | Wt 170.5 lb

## 2021-05-25 DIAGNOSIS — R296 Repeated falls: Secondary | ICD-10-CM | POA: Diagnosis not present

## 2021-05-25 DIAGNOSIS — R482 Apraxia: Secondary | ICD-10-CM

## 2021-05-25 DIAGNOSIS — F0390 Unspecified dementia without behavioral disturbance: Secondary | ICD-10-CM

## 2021-05-25 DIAGNOSIS — F0280 Dementia in other diseases classified elsewhere without behavioral disturbance: Secondary | ICD-10-CM | POA: Insufficient documentation

## 2021-05-25 DIAGNOSIS — L570 Actinic keratosis: Secondary | ICD-10-CM | POA: Diagnosis not present

## 2021-05-25 MED ORDER — RIVASTIGMINE 4.6 MG/24HR TD PT24: 4.6000 mg | MEDICATED_PATCH | Freq: Every day | TRANSDERMAL | 12 refills | Status: AC

## 2021-05-25 NOTE — Progress Notes (Addendum)
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  AHI is 0.5/h which is an excellent resolution    In short, BARI HANDSHOE is presenting with likely Alzheimer's type dementia and additional decline progression following a post traumatic SAH/ SDH on 07-05-2020.  He will remain on [place] patch  He needs a sitter in daytime.   I plan to follow up within 3-6 month.   Electronically signed by: Larey Seat, MD 05/25/2021 1:53 PM  [facility] Board certified by The [employer] of Sleep Medicine and Diplomate of the [employer] of Sleep Medicine. Board certified In Neurology through the [employer], Fellow of the [employer] of Neurology. Medical Director of [employer].

## 2021-05-31 ENCOUNTER — Other Ambulatory Visit: Payer: PPO

## 2021-06-14 DIAGNOSIS — R296 Repeated falls: Secondary | ICD-10-CM | POA: Diagnosis not present

## 2021-06-14 DIAGNOSIS — F039 Unspecified dementia: Secondary | ICD-10-CM | POA: Diagnosis not present

## 2021-06-27 DIAGNOSIS — R1310 Dysphagia, unspecified: Secondary | ICD-10-CM | POA: Diagnosis not present

## 2021-06-27 DIAGNOSIS — C44222 Squamous cell carcinoma of skin of right ear and external auricular canal: Secondary | ICD-10-CM | POA: Diagnosis not present

## 2021-07-05 ENCOUNTER — Encounter: Payer: Self-pay | Admitting: Neurology

## 2021-07-07 NOTE — Telephone Encounter (Signed)
I will review my notes after office hours today and addend what needs to be corrected. Sorry to respond delayed- I will be doing this tonight.

## 2021-07-19 DIAGNOSIS — F039 Unspecified dementia: Secondary | ICD-10-CM | POA: Diagnosis not present

## 2021-07-19 DIAGNOSIS — R296 Repeated falls: Secondary | ICD-10-CM | POA: Diagnosis not present

## 2021-08-15 DIAGNOSIS — L820 Inflamed seborrheic keratosis: Secondary | ICD-10-CM | POA: Diagnosis not present

## 2021-08-15 DIAGNOSIS — L821 Other seborrheic keratosis: Secondary | ICD-10-CM | POA: Diagnosis not present

## 2021-08-15 DIAGNOSIS — Z85828 Personal history of other malignant neoplasm of skin: Secondary | ICD-10-CM | POA: Diagnosis not present

## 2021-08-15 DIAGNOSIS — L219 Seborrheic dermatitis, unspecified: Secondary | ICD-10-CM | POA: Diagnosis not present

## 2021-08-15 DIAGNOSIS — L570 Actinic keratosis: Secondary | ICD-10-CM | POA: Diagnosis not present

## 2021-08-16 DIAGNOSIS — Z961 Presence of intraocular lens: Secondary | ICD-10-CM | POA: Diagnosis not present

## 2021-08-16 DIAGNOSIS — H02401 Unspecified ptosis of right eyelid: Secondary | ICD-10-CM | POA: Diagnosis not present

## 2021-08-16 DIAGNOSIS — R296 Repeated falls: Secondary | ICD-10-CM | POA: Diagnosis not present

## 2021-08-16 DIAGNOSIS — H10501 Unspecified blepharoconjunctivitis, right eye: Secondary | ICD-10-CM | POA: Diagnosis not present

## 2021-08-16 DIAGNOSIS — F039 Unspecified dementia: Secondary | ICD-10-CM | POA: Diagnosis not present

## 2021-08-16 DIAGNOSIS — H52202 Unspecified astigmatism, left eye: Secondary | ICD-10-CM | POA: Diagnosis not present

## 2021-08-16 DIAGNOSIS — H524 Presbyopia: Secondary | ICD-10-CM | POA: Diagnosis not present

## 2021-08-18 DIAGNOSIS — G4733 Obstructive sleep apnea (adult) (pediatric): Secondary | ICD-10-CM | POA: Diagnosis not present

## 2021-09-12 DIAGNOSIS — I129 Hypertensive chronic kidney disease with stage 1 through stage 4 chronic kidney disease, or unspecified chronic kidney disease: Secondary | ICD-10-CM | POA: Diagnosis not present

## 2021-09-12 DIAGNOSIS — M8580 Other specified disorders of bone density and structure, unspecified site: Secondary | ICD-10-CM | POA: Diagnosis not present

## 2021-09-12 DIAGNOSIS — E785 Hyperlipidemia, unspecified: Secondary | ICD-10-CM | POA: Diagnosis not present

## 2021-09-12 DIAGNOSIS — I10 Essential (primary) hypertension: Secondary | ICD-10-CM | POA: Diagnosis not present

## 2021-09-13 DIAGNOSIS — R296 Repeated falls: Secondary | ICD-10-CM | POA: Diagnosis not present

## 2021-09-13 DIAGNOSIS — F039 Unspecified dementia: Secondary | ICD-10-CM | POA: Diagnosis not present

## 2021-10-04 DIAGNOSIS — Z87891 Personal history of nicotine dependence: Secondary | ICD-10-CM | POA: Diagnosis not present

## 2021-10-04 DIAGNOSIS — I10 Essential (primary) hypertension: Secondary | ICD-10-CM | POA: Diagnosis not present

## 2021-10-04 DIAGNOSIS — E785 Hyperlipidemia, unspecified: Secondary | ICD-10-CM | POA: Diagnosis not present

## 2021-10-12 DIAGNOSIS — I10 Essential (primary) hypertension: Secondary | ICD-10-CM | POA: Diagnosis not present

## 2021-10-12 DIAGNOSIS — E785 Hyperlipidemia, unspecified: Secondary | ICD-10-CM | POA: Diagnosis not present

## 2021-10-12 DIAGNOSIS — I129 Hypertensive chronic kidney disease with stage 1 through stage 4 chronic kidney disease, or unspecified chronic kidney disease: Secondary | ICD-10-CM | POA: Diagnosis not present

## 2021-10-12 DIAGNOSIS — M8580 Other specified disorders of bone density and structure, unspecified site: Secondary | ICD-10-CM | POA: Diagnosis not present

## 2021-10-13 DIAGNOSIS — R296 Repeated falls: Secondary | ICD-10-CM | POA: Diagnosis not present

## 2021-10-13 DIAGNOSIS — H02102 Unspecified ectropion of right lower eyelid: Secondary | ICD-10-CM | POA: Diagnosis not present

## 2021-10-13 DIAGNOSIS — F039 Unspecified dementia: Secondary | ICD-10-CM | POA: Diagnosis not present

## 2021-10-19 DIAGNOSIS — L570 Actinic keratosis: Secondary | ICD-10-CM | POA: Diagnosis not present

## 2021-10-19 DIAGNOSIS — C44319 Basal cell carcinoma of skin of other parts of face: Secondary | ICD-10-CM | POA: Diagnosis not present

## 2021-10-19 DIAGNOSIS — Z85828 Personal history of other malignant neoplasm of skin: Secondary | ICD-10-CM | POA: Diagnosis not present

## 2021-10-31 ENCOUNTER — Telehealth: Payer: Self-pay | Admitting: Neurology

## 2021-10-31 NOTE — Telephone Encounter (Signed)
VM left and mychart msg sent asking pt to cb and r/s 02/01 appt- MD out.

## 2021-11-10 DIAGNOSIS — M6281 Muscle weakness (generalized): Secondary | ICD-10-CM | POA: Diagnosis not present

## 2021-11-10 DIAGNOSIS — F0280 Dementia in other diseases classified elsewhere without behavioral disturbance: Secondary | ICD-10-CM | POA: Diagnosis not present

## 2021-11-10 DIAGNOSIS — R1310 Dysphagia, unspecified: Secondary | ICD-10-CM | POA: Diagnosis not present

## 2021-11-10 DIAGNOSIS — R296 Repeated falls: Secondary | ICD-10-CM | POA: Diagnosis not present

## 2021-11-10 DIAGNOSIS — G309 Alzheimer's disease, unspecified: Secondary | ICD-10-CM | POA: Diagnosis not present

## 2021-11-21 ENCOUNTER — Observation Stay (HOSPITAL_COMMUNITY): Payer: PPO

## 2021-11-21 ENCOUNTER — Other Ambulatory Visit: Payer: Self-pay

## 2021-11-21 ENCOUNTER — Emergency Department (HOSPITAL_COMMUNITY): Payer: PPO

## 2021-11-21 ENCOUNTER — Encounter (HOSPITAL_COMMUNITY): Payer: Self-pay

## 2021-11-21 ENCOUNTER — Inpatient Hospital Stay (HOSPITAL_COMMUNITY)
Admission: EM | Admit: 2021-11-21 | Discharge: 2021-11-25 | DRG: 082 | Disposition: A | Discharge status: Skilled Nursing Facility | Payer: PPO | Source: Skilled Nursing Facility | Attending: Internal Medicine | Admitting: Internal Medicine

## 2021-11-21 DIAGNOSIS — S6991XA Unspecified injury of right wrist, hand and finger(s), initial encounter: Secondary | ICD-10-CM | POA: Diagnosis not present

## 2021-11-21 DIAGNOSIS — S065XAA Traumatic subdural hemorrhage with loss of consciousness status unknown, initial encounter: Principal | ICD-10-CM | POA: Diagnosis present

## 2021-11-21 DIAGNOSIS — Z79899 Other long term (current) drug therapy: Secondary | ICD-10-CM | POA: Diagnosis not present

## 2021-11-21 DIAGNOSIS — F0280 Dementia in other diseases classified elsewhere without behavioral disturbance: Secondary | ICD-10-CM | POA: Diagnosis present

## 2021-11-21 DIAGNOSIS — G4710 Hypersomnia, unspecified: Secondary | ICD-10-CM | POA: Diagnosis present

## 2021-11-21 DIAGNOSIS — Z66 Do not resuscitate: Secondary | ICD-10-CM | POA: Diagnosis present

## 2021-11-21 DIAGNOSIS — G4733 Obstructive sleep apnea (adult) (pediatric): Secondary | ICD-10-CM | POA: Diagnosis present

## 2021-11-21 DIAGNOSIS — I6529 Occlusion and stenosis of unspecified carotid artery: Secondary | ICD-10-CM | POA: Diagnosis not present

## 2021-11-21 DIAGNOSIS — S60811A Abrasion of right wrist, initial encounter: Secondary | ICD-10-CM | POA: Diagnosis present

## 2021-11-21 DIAGNOSIS — F02C18 Dementia in other diseases classified elsewhere, severe, with other behavioral disturbance: Secondary | ICD-10-CM | POA: Diagnosis present

## 2021-11-21 DIAGNOSIS — H02102 Unspecified ectropion of right lower eyelid: Secondary | ICD-10-CM | POA: Diagnosis present

## 2021-11-21 DIAGNOSIS — Z86010 Personal history of colonic polyps: Secondary | ICD-10-CM

## 2021-11-21 DIAGNOSIS — M2550 Pain in unspecified joint: Secondary | ICD-10-CM | POA: Diagnosis not present

## 2021-11-21 DIAGNOSIS — R296 Repeated falls: Secondary | ICD-10-CM | POA: Diagnosis present

## 2021-11-21 DIAGNOSIS — Z888 Allergy status to other drugs, medicaments and biological substances status: Secondary | ICD-10-CM

## 2021-11-21 DIAGNOSIS — Z87891 Personal history of nicotine dependence: Secondary | ICD-10-CM

## 2021-11-21 DIAGNOSIS — Z9852 Vasectomy status: Secondary | ICD-10-CM

## 2021-11-21 DIAGNOSIS — R000 Tachycardia, unspecified: Secondary | ICD-10-CM | POA: Diagnosis not present

## 2021-11-21 DIAGNOSIS — E875 Hyperkalemia: Secondary | ICD-10-CM | POA: Diagnosis present

## 2021-11-21 DIAGNOSIS — I4891 Unspecified atrial fibrillation: Secondary | ICD-10-CM | POA: Diagnosis not present

## 2021-11-21 DIAGNOSIS — S0101XA Laceration without foreign body of scalp, initial encounter: Secondary | ICD-10-CM | POA: Diagnosis present

## 2021-11-21 DIAGNOSIS — G936 Cerebral edema: Secondary | ICD-10-CM | POA: Diagnosis not present

## 2021-11-21 DIAGNOSIS — I615 Nontraumatic intracerebral hemorrhage, intraventricular: Secondary | ICD-10-CM | POA: Diagnosis not present

## 2021-11-21 DIAGNOSIS — R1310 Dysphagia, unspecified: Secondary | ICD-10-CM | POA: Diagnosis present

## 2021-11-21 DIAGNOSIS — I447 Left bundle-branch block, unspecified: Secondary | ICD-10-CM | POA: Diagnosis present

## 2021-11-21 DIAGNOSIS — G309 Alzheimer's disease, unspecified: Secondary | ICD-10-CM | POA: Diagnosis present

## 2021-11-21 DIAGNOSIS — M25531 Pain in right wrist: Secondary | ICD-10-CM | POA: Diagnosis not present

## 2021-11-21 DIAGNOSIS — Z85828 Personal history of other malignant neoplasm of skin: Secondary | ICD-10-CM

## 2021-11-21 DIAGNOSIS — I21A1 Myocardial infarction type 2: Secondary | ICD-10-CM | POA: Diagnosis present

## 2021-11-21 DIAGNOSIS — R55 Syncope and collapse: Secondary | ICD-10-CM | POA: Diagnosis present

## 2021-11-21 DIAGNOSIS — Z20822 Contact with and (suspected) exposure to covid-19: Secondary | ICD-10-CM | POA: Diagnosis present

## 2021-11-21 DIAGNOSIS — G9340 Encephalopathy, unspecified: Secondary | ICD-10-CM | POA: Diagnosis present

## 2021-11-21 DIAGNOSIS — R9431 Abnormal electrocardiogram [ECG] [EKG]: Secondary | ICD-10-CM | POA: Diagnosis not present

## 2021-11-21 DIAGNOSIS — E7800 Pure hypercholesterolemia, unspecified: Secondary | ICD-10-CM | POA: Diagnosis present

## 2021-11-21 DIAGNOSIS — N183 Chronic kidney disease, stage 3 unspecified: Secondary | ICD-10-CM

## 2021-11-21 DIAGNOSIS — R339 Retention of urine, unspecified: Secondary | ICD-10-CM | POA: Diagnosis present

## 2021-11-21 DIAGNOSIS — Z7401 Bed confinement status: Secondary | ICD-10-CM | POA: Diagnosis not present

## 2021-11-21 DIAGNOSIS — N1831 Chronic kidney disease, stage 3a: Secondary | ICD-10-CM | POA: Diagnosis present

## 2021-11-21 DIAGNOSIS — I10 Essential (primary) hypertension: Secondary | ICD-10-CM | POA: Diagnosis present

## 2021-11-21 DIAGNOSIS — M542 Cervicalgia: Secondary | ICD-10-CM | POA: Diagnosis not present

## 2021-11-21 DIAGNOSIS — R5383 Other fatigue: Secondary | ICD-10-CM | POA: Diagnosis not present

## 2021-11-21 DIAGNOSIS — W19XXXA Unspecified fall, initial encounter: Secondary | ICD-10-CM | POA: Diagnosis present

## 2021-11-21 DIAGNOSIS — I6200 Nontraumatic subdural hemorrhage, unspecified: Secondary | ICD-10-CM | POA: Diagnosis not present

## 2021-11-21 DIAGNOSIS — Z8673 Personal history of transient ischemic attack (TIA), and cerebral infarction without residual deficits: Secondary | ICD-10-CM | POA: Diagnosis not present

## 2021-11-21 DIAGNOSIS — S066X0A Traumatic subarachnoid hemorrhage without loss of consciousness, initial encounter: Secondary | ICD-10-CM | POA: Diagnosis not present

## 2021-11-21 DIAGNOSIS — I129 Hypertensive chronic kidney disease with stage 1 through stage 4 chronic kidney disease, or unspecified chronic kidney disease: Secondary | ICD-10-CM | POA: Diagnosis present

## 2021-11-21 DIAGNOSIS — F039 Unspecified dementia: Secondary | ICD-10-CM | POA: Diagnosis not present

## 2021-11-21 DIAGNOSIS — Z8249 Family history of ischemic heart disease and other diseases of the circulatory system: Secondary | ICD-10-CM

## 2021-11-21 DIAGNOSIS — R58 Hemorrhage, not elsewhere classified: Secondary | ICD-10-CM | POA: Diagnosis not present

## 2021-11-21 DIAGNOSIS — S065X0A Traumatic subdural hemorrhage without loss of consciousness, initial encounter: Secondary | ICD-10-CM | POA: Diagnosis not present

## 2021-11-21 DIAGNOSIS — Z043 Encounter for examination and observation following other accident: Secondary | ICD-10-CM | POA: Diagnosis not present

## 2021-11-21 DIAGNOSIS — G301 Alzheimer's disease with late onset: Secondary | ICD-10-CM | POA: Diagnosis not present

## 2021-11-21 DIAGNOSIS — E785 Hyperlipidemia, unspecified: Secondary | ICD-10-CM | POA: Diagnosis present

## 2021-11-21 DIAGNOSIS — F02B4 Dementia in other diseases classified elsewhere, moderate, with anxiety: Secondary | ICD-10-CM | POA: Diagnosis not present

## 2021-11-21 DIAGNOSIS — R748 Abnormal levels of other serum enzymes: Secondary | ICD-10-CM | POA: Diagnosis not present

## 2021-11-21 LAB — URINALYSIS, ROUTINE W REFLEX MICROSCOPIC
Bilirubin Urine: NEGATIVE
Glucose, UA: NEGATIVE mg/dL
Ketones, ur: 20 mg/dL — AB
Leukocytes,Ua: NEGATIVE
Nitrite: NEGATIVE
Protein, ur: 100 mg/dL — AB
Specific Gravity, Urine: 1.017 (ref 1.005–1.030)
pH: 6 (ref 5.0–8.0)

## 2021-11-21 LAB — CBC WITH DIFFERENTIAL/PLATELET
Abs Immature Granulocytes: 0.09 10*3/uL — ABNORMAL HIGH (ref 0.00–0.07)
Basophils Absolute: 0.1 10*3/uL (ref 0.0–0.1)
Basophils Relative: 1 %
Eosinophils Absolute: 0.1 10*3/uL (ref 0.0–0.5)
Eosinophils Relative: 1 %
HCT: 54 % — ABNORMAL HIGH (ref 39.0–52.0)
Hemoglobin: 17.7 g/dL — ABNORMAL HIGH (ref 13.0–17.0)
Immature Granulocytes: 1 %
Lymphocytes Relative: 11 %
Lymphs Abs: 1.2 10*3/uL (ref 0.7–4.0)
MCH: 30.6 pg (ref 26.0–34.0)
MCHC: 32.8 g/dL (ref 30.0–36.0)
MCV: 93.3 fL (ref 80.0–100.0)
Monocytes Absolute: 0.6 10*3/uL (ref 0.1–1.0)
Monocytes Relative: 5 %
Neutro Abs: 9.1 10*3/uL — ABNORMAL HIGH (ref 1.7–7.7)
Neutrophils Relative %: 81 %
Platelets: 198 10*3/uL (ref 150–400)
RBC: 5.79 MIL/uL (ref 4.22–5.81)
RDW: 13.2 % (ref 11.5–15.5)
WBC: 11.1 10*3/uL — ABNORMAL HIGH (ref 4.0–10.5)
nRBC: 0 % (ref 0.0–0.2)

## 2021-11-21 LAB — BASIC METABOLIC PANEL
Anion gap: 10 (ref 5–15)
Anion gap: 12 (ref 5–15)
BUN: 20 mg/dL (ref 8–23)
BUN: 14 mg/dL (ref 8–23)
CO2: 24 mmol/L (ref 22–32)
CO2: 22 mmol/L (ref 22–32)
Calcium: 9.4 mg/dL (ref 8.9–10.3)
Calcium: 9.3 mg/dL (ref 8.9–10.3)
Chloride: 104 mmol/L (ref 98–111)
Chloride: 100 mmol/L (ref 98–111)
Creatinine, Ser: 1.1 mg/dL (ref 0.61–1.24)
Creatinine, Ser: 1.11 mg/dL (ref 0.61–1.24)
GFR, Estimated: >60 mL/min (ref >60)
GFR, Estimated: >60 mL/min (ref >60)
Glucose, Bld: 126 mg/dL — ABNORMAL HIGH (ref 70–99)
Glucose, Bld: 168 mg/dL — ABNORMAL HIGH (ref 70–99)
Potassium: 5.2 mmol/L — ABNORMAL HIGH (ref 3.5–5.1)
Potassium: 3.8 mmol/L (ref 3.5–5.1)
Sodium: 138 mmol/L (ref 135–145)
Sodium: 134 mmol/L — ABNORMAL LOW (ref 135–145)

## 2021-11-21 LAB — TSH: TSH: 0.476 u[IU]/mL (ref 0.350–4.500)

## 2021-11-21 LAB — CK: Total CK: 318 U/L (ref 49–397)

## 2021-11-21 LAB — TROPONIN I (HIGH SENSITIVITY): Troponin I (High Sensitivity): 243 ng/L (ref <18)

## 2021-11-21 LAB — CBG MONITORING, ED: Glucose-Capillary: 124 mg/dL — ABNORMAL HIGH (ref 70–99)

## 2021-11-21 MED ORDER — HYDRALAZINE HCL 10 MG PO TABS: 10.0000 mg | ORAL_TABLET | Freq: Once | ORAL | Status: DC

## 2021-11-21 MED ORDER — DIPHENHYDRAMINE HCL 25 MG PO CAPS
25.0000 mg | ORAL_CAPSULE | Freq: Four times a day (QID) | ORAL | Status: DC | PRN
  Filled 2021-11-21: qty 1

## 2021-11-21 MED ORDER — LIDOCAINE HCL (PF) 1 % IJ SOLN
30.0000 mL | Freq: Once | INTRAMUSCULAR | Status: AC
  Administered 2021-11-21: 30 mL
  Filled 2021-11-21: qty 30

## 2021-11-21 MED ORDER — SODIUM CHLORIDE 0.9% FLUSH
3.0000 mL | Freq: Two times a day (BID) | INTRAVENOUS | Status: DC
  Administered 2021-11-21 – 2021-11-25 (×7): 3 mL via INTRAVENOUS

## 2021-11-21 MED ORDER — LACTATED RINGERS IV BOLUS
250.0000 mL | Freq: Once | INTRAVENOUS | Status: AC
  Administered 2021-11-21: 250 mL via INTRAVENOUS

## 2021-11-21 MED ORDER — HYDRALAZINE HCL 20 MG/ML IJ SOLN: 5.0000 mg | Freq: Four times a day (QID) | INTRAMUSCULAR | Status: DC | PRN

## 2021-11-21 MED ORDER — OFLOXACIN 0.3 % OP SOLN
1.0000 [drp] | Freq: Two times a day (BID) | OPHTHALMIC | Status: DC
  Administered 2021-11-21 – 2021-11-25 (×8): 1 [drp] via OPHTHALMIC
  Filled 2021-11-21: qty 5

## 2021-11-21 MED ORDER — MORPHINE SULFATE (PF) 2 MG/ML IV SOLN
1.0000 mg | Freq: Once | INTRAVENOUS | Status: AC
  Administered 2021-11-21: 1 mg via INTRAVENOUS
  Filled 2021-11-21: qty 1

## 2021-11-21 MED ORDER — RAMIPRIL 2.5 MG PO CAPS
2.5000 mg | ORAL_CAPSULE | Freq: Every day | ORAL | Status: DC
  Administered 2021-11-21 – 2021-11-25 (×5): 2.5 mg via ORAL
  Filled 2021-11-21 (×5): qty 1

## 2021-11-21 MED ORDER — QUETIAPINE FUMARATE 50 MG PO TABS
50.0000 mg | ORAL_TABLET | Freq: Once | ORAL | Status: DC
  Filled 2021-11-21 (×2): qty 1

## 2021-11-21 MED ORDER — CITALOPRAM HYDROBROMIDE 10 MG PO TABS
10.0000 mg | ORAL_TABLET | Freq: Every day | ORAL | Status: DC
  Administered 2021-11-21 – 2021-11-25 (×5): 10 mg via ORAL
  Filled 2021-11-21 (×5): qty 1

## 2021-11-21 MED ORDER — ATORVASTATIN CALCIUM 10 MG PO TABS: 20.0000 mg | ORAL_TABLET | Freq: Every day | ORAL | Status: DC

## 2021-11-21 MED ORDER — ACETAMINOPHEN 325 MG PO TABS
650.0000 mg | ORAL_TABLET | Freq: Four times a day (QID) | ORAL | Status: DC | PRN
  Administered 2021-11-25: 650 mg via ORAL
  Filled 2021-11-21: qty 2

## 2021-11-21 MED ORDER — SODIUM CHLORIDE 0.9% FLUSH: 3.0000 mL | INTRAVENOUS | Status: DC | PRN

## 2021-11-21 MED ORDER — SODIUM CHLORIDE 0.9 % IV SOLN: 250.0000 mL | INTRAVENOUS | Status: DC | PRN

## 2021-11-21 MED ORDER — HYDROCODONE-ACETAMINOPHEN 5-325 MG PO TABS: 1.0000 | ORAL_TABLET | ORAL | Status: DC | PRN

## 2021-11-21 MED ORDER — RIVASTIGMINE 4.6 MG/24HR TD PT24
4.6000 mg | MEDICATED_PATCH | Freq: Every day | TRANSDERMAL | Status: DC
  Administered 2021-11-22 – 2021-11-25 (×4): 4.6 mg via TRANSDERMAL
  Filled 2021-11-21 (×5): qty 1

## 2021-11-21 MED ORDER — ACETAMINOPHEN 650 MG RE SUPP: 650.0000 mg | Freq: Four times a day (QID) | RECTAL | Status: DC | PRN

## 2021-11-21 MED ORDER — HYDRALAZINE HCL 20 MG/ML IJ SOLN
2.0000 mg | Freq: Four times a day (QID) | INTRAMUSCULAR | Status: DC | PRN
  Administered 2021-11-21: 2 mg via INTRAVENOUS
  Filled 2021-11-21: qty 1

## 2021-11-21 NOTE — Assessment & Plan Note (Addendum)
At baseline, in moderate stages per neurology note  Continue exelon Delirium precautions, request family to stay at bedside

## 2021-11-21 NOTE — ED Notes (Signed)
This RN paged MD Bridgett Larsson regarding pt's recent change in neuro status and increase in agitation and confusion. Awaiting response.

## 2021-11-21 NOTE — Assessment & Plan Note (Signed)
Continue eyedrops 

## 2021-11-21 NOTE — ED Notes (Signed)
This writer noted on telemetry monitor that pt's heart rate is in the 140s. Pt is restless and increasingly agitated. Pt's cardiac rhythm appeared to be different than what today's EKG showed. Another EKG was repeated, showed acute MI. Clara, RN  and admitting physician Dr. [name] Blocker notified of this abnormal change.

## 2021-11-21 NOTE — Assessment & Plan Note (Addendum)
History of recurrent falls with SAH in the past (2021) and frequent extensive work ups by his cardiologist and neurologist -per neurology note in December, has had 24 falls 2 requiring ER visits.  -recently discussed ILR with cardiology, but family opted not to pursue this  continue to be followed by neurology for his dementia -will have PT evaluation for recommendations -fall precautions placed

## 2021-11-21 NOTE — Assessment & Plan Note (Addendum)
Patient with known history of frequent falls, unwitnessed today Has had extensive cardiology work up in the past by his cardiologist, declined [place] on telemetry Will check echo/troponin  Hx of bilateral carotid disease. Last carotid doppler in 10/21, repeat today  Orthostatics

## 2021-11-21 NOTE — ED Notes (Addendum)
NT Thuy notified this RN about an EKG change form his previous EKG's. His current EKG shows signs of an acute MI. This RN then notified and consulted with MD [name] Blocker, who is aware of the situation.

## 2021-11-21 NOTE — H&P (Signed)
History and Physical    Jeffrey [name] QQV:[id number] DOB: 05-09-1941 DOA: 11/21/2021  PCP: Dr. Crist Infante Consultants:  cardiology: Dr. Einar Gip, neurology: Dr. Brett Fairy Patient coming from:  [facility], lives with his wife   Chief Complaint: fall   HPI: Jeffrey [name] is a 81 y.o. male with medical history significant of dementia with frequent falls, HTN, HLD, OSA on cpap, CKD stage III, history of skin cancer, SAH in 2021 due to fall, skin cancer, carotid artery disease who presented to ED after fall at his independent living this morning. History from his wife.   He typically walks to the community center for a coffee and to get a newspaper. He was on his way home and fell and was found on the ground on the pavement. He was found by his wife. She thinks he was on the ground for around 15 minutes. He was unconsciousness when she found him, but breathing and quickly came to and talked to her.   History of frequent falls with work up of heart/neurology work up. Thought to be due to his dementia/multiple things with no other intervention.   He has otherwise been feeling well. Denies any fever/chills, headaches or dizziness, chest pain or palpitations, shortness of breath or cough, abdominal pain, N/v/D, leg swelling, dysuria.   ED Course: vitals: afebrile, bp: 186/111, HR: 69, RR: 20, oxygen: 98% RA Pertinent labs: wbc: 11.1, hgb: 17.7, potassium: 5.2,  CTH: small acute bilateral subdural hematomas without mass effect. Small volume acute hemorrhage in the fourth ventricle. Small hyperdensity along the posteriolateral aspect of the 3rd ventricle on the right side, likely additional extra-axial hemorrhage although deep cerebral vein thrombosis is an alternative No acute cervical spine fracture.  In ED neurosurgery was consulted and [facility] was asked to admit.   Review of Systems: As per HPI; otherwise review of systems reviewed and negative.   Ambulatory Status:  Ambulates  with walker   Past Medical History:  Diagnosis Date   Colon polyps    Diverticulosis    Hypercholesterolemia    Hypertension    Internal hemorrhoids     Past Surgical History:  Procedure Laterality Date   EYE SURGERY     X 4   HERNIA REPAIR  2006   abdominal   SKIN CANCER EXCISION     shoulder, basal cell   SQUAMOUS CELL CARCINOMA EXCISION     preauricular on the right   TONSILLECTOMY  1947   VASECTOMY  1981    Social History   Socioeconomic History   Marital status: Married    Spouse name: Not on file   Number of children: 2   Years of education: Not on file   Highest education level: Master's degree (e.g., MA, MS, MEng, MEd, MSW, MBA)  Occupational History   Occupation: retired  Tobacco Use   Smoking status: Former    Packs/day: 0.25    Years: 3.00    Pack years: 0.75    Types: Cigarettes    Quit date: 08/21/1966    Years since quitting: 55.2   Smokeless tobacco: Never  Vaping Use   Vaping Use: Never used  Substance and Sexual Activity   Alcohol use: Yes    Comment: limited   Drug use: No   Sexual activity: Yes  Other Topics Concern   Not on file  Social History Narrative   Not on file   Social Determinants of Health   Financial Resource Strain: Not on file  Food  Insecurity: Not on file  Transportation Needs: Not on file  Physical Activity: Not on file  Stress: Not on file  Social Connections: Not on file  Intimate Partner Violence: Not on file    Allergies  Allergen Reactions   Donepezil Other (See Comments)    Per wife "has not done well."   Other Other (See Comments)    Vicryl sutures, pte. Doesn't know what reaction he had, "Drs. Owens Shark + Yeatts deetrmined not to use it on me"    Family History  Problem Relation Age of Onset   Heart disease Father    Heart attack Father    Alzheimer's disease Mother    Heart failure Brother    Colon cancer Neg Hx    Stomach cancer Neg Hx    Esophageal cancer Neg Hx    Rectal cancer Neg Hx     Liver cancer Neg Hx     Prior to Admission medications   Medication Sig Start Date End Date Taking? Authorizing Provider  citalopram (CELEXA) 10 MG tablet Take 1 tablet (10 mg total) by mouth daily. 12/22/20  Yes Dohmeier, Asencion Partridge, MD  ketoconazole (NIZORAL) 2 % shampoo Apply 1 application topically 3 (three) times a week. 06/06/21  Yes [provider]  niacinamide 500 MG tablet Take 500 mg by mouth 2 (two) times daily with a meal.   Yes [provider]  ofloxacin (OCUFLOX) 0.3 % ophthalmic solution Place 1 drop into the right eye 2 (two) times daily. 10/13/21  Yes [provider]  ramipril (ALTACE) 2.5 MG capsule Take 2.5 mg by mouth daily. 11/11/19  Yes [provider]  rivastigmine (EXELON) 4.6 mg/24hr Place 1 patch (4.6 mg total) onto the skin daily. 05/25/21  Yes Dohmeier, Asencion Partridge, MD  triamcinolone cream (KENALOG) 0.1 % Apply 1 application topically daily. 10/19/21  Yes [provider]  valACYclovir (VALTREX) 500 MG tablet Take 500 mg by mouth 2 (two) times daily as needed. Flare up 08/15/21  Yes [provider]  atorvastatin (LIPITOR) 20 MG tablet Take 20 mg by mouth daily at 6 PM.     [provider]    Physical Exam: Vitals:   11/21/21 1430 11/21/21 1445 11/21/21 1500 11/21/21 1515  BP: (!) 192/86 (!) 165/98 (!) 155/74 (!) 150/87  Pulse: 92 (!) 39 (!) 30 (!) 31  Resp: (!) 26 20 (!) 26 (!) 23  Temp:      TempSrc:      SpO2: 96% 96% 95% 96%     General:  Appears calm and comfortable and is in NAD Eyes:  PERRL, EOMI, normal lids, iris. Right eye doesn't open fully due to surgeries from skin cancer  ENT:  grossly normal hearing, lips & tongue, mmm; appropriate dentition Neck:  no LAD, masses or thyromegaly; no carotid bruits Cardiovascular:  RRR, no m/r/g. No LE edema.  Respiratory:   CTA bilaterally with no wheezes/rales/rhonchi.  Normal respiratory effort. Abdomen:  soft, NT, ND, NABS Back:   normal alignment, no  CVAT Skin:  no rash or induration seen on limited exam. Laceration to back of scalp. Staples placed  Musculoskeletal:  grossly normal tone BUE/BLE, good ROM, no bony abnormality Lower extremity:  No LE edema.  Limited foot exam with no ulcerations.  2+ distal pulses. Psychiatric:  grossly normal mood and affect, speech fluent and appropriate, Alert and oriented to person. Thought he was at [facility].  Neurologic:  CN 2-12 grossly intact, moves all extremities in coordinated fashion, sensation  intact    Radiological Exams on Admission: Independently reviewed - see discussion in A/P where applicable  DG Wrist 2 Views Right  Result Date: 11/21/2021 CLINICAL DATA:  Fall, dementia EXAM: RIGHT WRIST - 2 VIEW COMPARISON:  None. FINDINGS: There is no evidence of fracture or dislocation. There is no evidence of arthropathy or other focal bone abnormality. Soft tissues are unremarkable. IMPRESSION: Negative. Electronically Signed   By: Keane Police D.O.   On: 11/21/2021 16:19   DG Wrist Complete Right  Result Date: 11/21/2021 CLINICAL DATA:  Trauma, pain EXAM: RIGHT WRIST - COMPLETE 3+ VIEW COMPARISON:  None. FINDINGS: No recent fracture or dislocation is seen. Degenerative changes are noted with bony spurs in first carpometacarpal joint and the interphalangeal joint of thumb. Minimal bony spurs seen in the lateral radiocarpal joint. IMPRESSION: No recent fracture or dislocation is seen in the right wrist. Electronically Signed   By: Elmer Picker M.D.   On: 11/21/2021 11:29   CT Head Wo Contrast  Addendum Date: 11/21/2021   ADDENDUM REPORT: 11/21/2021 11:59 ADDENDUM: Posterior scalp laceration. Electronically Signed   By: Logan Bores M.D.   On: 11/21/2021 11:59   Result Date: 11/21/2021 CLINICAL DATA:  Head trauma.  Fall today.  Head and neck pain. EXAM: CT HEAD WITHOUT CONTRAST CT CERVICAL SPINE WITHOUT CONTRAST TECHNIQUE: Multidetector CT imaging of the head and cervical spine was  performed following the standard protocol without intravenous contrast. Multiplanar CT image reconstructions of the cervical spine were also generated. COMPARISON:  CT head and cervical spine 03/05/2021 FINDINGS: CT HEAD FINDINGS Brain: A new small hyperdense subdural hematoma over the left lateral cerebral convexity measures up to 3 mm in thickness without mass effect. Trace subdural hemorrhage is also present along the posterior aspect of the falx and along the left greater than right tentorial leaflets, and there is also new trace hypoattenuating subdural fluid or blood over the right cerebral convexity. There is minimal acute hemorrhage in the fourth ventricle. There is also a small extra-axial focus of curvilinear hyperdensity along the posterolateral aspect of third ventricle coursing towards the pineal region (series 4, image 16). No acute cortically based infarct or midline shift is evident. There is mild cerebral atrophy. Patchy hypodensities in the cerebral white matter bilaterally are unchanged and nonspecific but compatible with moderate chronic small vessel ischemic disease. Vascular: Calcified atherosclerosis at the skull base. Skull: No acute fracture or suspicious osseous lesion. Sinuses/Orbits: Clear paranasal sinuses. Unchanged large right mastoid effusion. Right eyelid weight. Bilateral cataract extraction. Other: Postoperative changes involving the right parotid gland. CT CERVICAL SPINE FINDINGS Alignment: Chronic trace anterolisthesis of C7 on T1. Skull base and vertebrae: No acute fracture or suspicious osseous lesion. Soft tissues and spinal canal: No prevertebral fluid or swelling. No visible canal hematoma. Disc levels: Multilevel disc degeneration, moderate at C6-7. Asymmetrically advanced right-sided cervical facet arthrosis at multiple levels. Right facet ankylosis at C2-3. Moderate multilevel neural foraminal stenosis and at least mild multilevel spinal stenosis. Upper chest: Clear lung  apices. Other: Postoperative changes in the right upper neck. IMPRESSION: 1. Small acute bilateral subdural hematomas without mass effect. 2. Small volume acute hemorrhage in the fourth ventricle. 3. Small hyperdensity along the posterolateral aspect of the third ventricle on the right, likely additional extra-axial hemorrhage although deep cerebral vein thrombosis is an alternative consideration. 4. Moderate chronic small vessel ischemic disease. 5. No acute cervical spine fracture. Critical Value/emergent results were called by telephone at the time of interpretation on 11/21/2021 at  11:47 am to provider Myna Bright, who verbally acknowledged these results. Electronically Signed: By: Logan Bores M.D. On: 11/21/2021 11:54   CT Cervical Spine Wo Contrast  Addendum Date: 11/21/2021   ADDENDUM REPORT: 11/21/2021 11:59 ADDENDUM: Posterior scalp laceration. Electronically Signed   By: Logan Bores M.D.   On: 11/21/2021 11:59   Result Date: 11/21/2021 CLINICAL DATA:  Head trauma.  Fall today.  Head and neck pain. EXAM: CT HEAD WITHOUT CONTRAST CT CERVICAL SPINE WITHOUT CONTRAST TECHNIQUE: Multidetector CT imaging of the head and cervical spine was performed following the standard protocol without intravenous contrast. Multiplanar CT image reconstructions of the cervical spine were also generated. COMPARISON:  CT head and cervical spine 03/05/2021 FINDINGS: CT HEAD FINDINGS Brain: A new small hyperdense subdural hematoma over the left lateral cerebral convexity measures up to 3 mm in thickness without mass effect. Trace subdural hemorrhage is also present along the posterior aspect of the falx and along the left greater than right tentorial leaflets, and there is also new trace hypoattenuating subdural fluid or blood over the right cerebral convexity. There is minimal acute hemorrhage in the fourth ventricle. There is also a small extra-axial focus of curvilinear hyperdensity along the posterolateral aspect of third  ventricle coursing towards the pineal region (series 4, image 16). No acute cortically based infarct or midline shift is evident. There is mild cerebral atrophy. Patchy hypodensities in the cerebral white matter bilaterally are unchanged and nonspecific but compatible with moderate chronic small vessel ischemic disease. Vascular: Calcified atherosclerosis at the skull base. Skull: No acute fracture or suspicious osseous lesion. Sinuses/Orbits: Clear paranasal sinuses. Unchanged large right mastoid effusion. Right eyelid weight. Bilateral cataract extraction. Other: Postoperative changes involving the right parotid gland. CT CERVICAL SPINE FINDINGS Alignment: Chronic trace anterolisthesis of C7 on T1. Skull base and vertebrae: No acute fracture or suspicious osseous lesion. Soft tissues and spinal canal: No prevertebral fluid or swelling. No visible canal hematoma. Disc levels: Multilevel disc degeneration, moderate at C6-7. Asymmetrically advanced right-sided cervical facet arthrosis at multiple levels. Right facet ankylosis at C2-3. Moderate multilevel neural foraminal stenosis and at least mild multilevel spinal stenosis. Upper chest: Clear lung apices. Other: Postoperative changes in the right upper neck. IMPRESSION: 1. Small acute bilateral subdural hematomas without mass effect. 2. Small volume acute hemorrhage in the fourth ventricle. 3. Small hyperdensity along the posterolateral aspect of the third ventricle on the right, likely additional extra-axial hemorrhage although deep cerebral vein thrombosis is an alternative consideration. 4. Moderate chronic small vessel ischemic disease. 5. No acute cervical spine fracture. Critical Value/emergent results were called by telephone at the time of interpretation on 11/21/2021 at 11:47 am to provider Myna Bright, who verbally acknowledged these results. Electronically Signed: By: Logan Bores M.D. On: 11/21/2021 11:54    EKG: Independently reviewed.  NSR with rate  85; nonspecific ST changes with no evidence of acute ischemia. LBBB on previous ekg    Labs on Admission: I have personally reviewed the available labs and imaging studies at the time of the admission.  Pertinent labs:  wbc: 11.1,  hgb: 17.7,  potassium: 5.2,    Assessment/Plan Bilateral subdural hematomas- (present on admission) 81 year old male with dementia and frequent falls who fell this AM and was found unconscious in parking lot by his wife. Imaging revealed bilateral subdural hematomas -observation to progressive with frequent neuro checks -neurosurgery consulted, no intervention at this time [facility] also reveals small hyperdensity along the posteriolateral aspect of the 3rd ventricle  on the right side, likely additional extra-axial hemorrhage although deep cerebral vein thrombosis is an alternative. Consider MRI if indicated -hold AC, on no ASA or plavix at home   Frequent falls History of recurrent falls with [facility] in the past (2021) and frequent extensive work ups by his cardiologist and neurologist -per neurology note in December, has had 24 falls 2 requiring ER visits.  -recently discussed ILR with cardiology, but family opted not to pursue this  continue to be followed by neurology for his dementia -will have PT evaluation for recommendations -fall precautions placed   possible syncope and collapse- (present on admission) Patient with known history of frequent falls, unwitnessed today Has had extensive cardiology work up in the past by his cardiologist, declined [place] on telemetry Will check echo/troponin  Hx of bilateral carotid disease. Last carotid doppler in 10/21, repeat today  Orthostatics   Hyperkalemia- (present on admission) Appears hemoconcentrated and dry with skin tenting Small fluid bolus Repeat potassium   Alzheimer's dementia ([place])- (present on admission) At baseline, in moderate stages per neurology note  Continue exelon Delirium precautions,  request family to stay at bedside   Essential hypertension- (present on admission) Elevated, start back home medication and monitor IV parameters prn. Hx of bradycardia, will use hydralazine prn   Hyperlipidemia- (present on admission) Off statin due to myalgias, continue to hold Follow up with PCP   Hypersomnia with sleep apnea- (present on admission) Continue home cpap   Dysphagia From his treatment/surgeries to his skin cancer on neck/face Known to aspirate per neurology notes His daughter states he has been seen and evaluated by slp in the past, decline eval while here today   Ectropion of right lower eyelid Continue eye drops  CKD (chronic kidney disease), stage III (HCC) At baseline, monitor      There is no height or weight on file to calculate BMI.   Level of care: Progressive DVT prophylaxis:  SCDs Code Status: partial-bipap if indicated only   Family Communication: wife at bedside: Cedrik Heindl and daughter on phone: Joni Reining  Disposition Plan:  The patient is from: home  Anticipated d/c is to: home    Patient placed in observation as anticipate less than 2 midnight stay. Requires hospitalization for observation secondary to falls with subdural hematomas and is at risk of neurologically worsening. Requires frequency  monitoring, assessment and MDM with specialists.    Patient is currently: stable Consults called: neurosurgeon/PT  Admission status:  observation    Orma Flaming MD [facility]   How to contact the [facility] Attending or Consulting provider [place] or covering provider during after hours [place], for this patient?  Check the care team in [facility] and look for a) attending/consulting [facility] provider listed and b) the [facility] team listed Log into [email or web address] and use [place]'s universal password to access. If you do not have the password, please contact the hospital operator. Locate the [facility] provider you are looking for under [facility] and page to a number that you can be directly reached. If you still have difficulty reaching the provider, please page the [facility] (Director on Call) for the Hospitalists listed on amion for assistance.   11/21/2021, 4:31 PM

## 2021-11-21 NOTE — Consult Note (Signed)
Reason for Consult:sdh Referring Physician: EDP  CHRISTIEN FRANKL is an 81 y.o. male.   HPI:  81 year old male presented to the ED today after sustaining a fall outside on the concrete. His wife states that she came home from water aerobics and found him down. He has a history of dementia and alzheimer's with about 25 falls in the last couple months. Patient denies any headaches and pleasantly confused at the moment.   Past Medical History:  Diagnosis Date   Colon polyps    Diverticulosis    Hypercholesterolemia    Hypertension    Internal hemorrhoids     Past Surgical History:  Procedure Laterality Date   EYE SURGERY     X 4   HERNIA REPAIR  2006   abdominal   SKIN CANCER EXCISION     shoulder, basal cell   SQUAMOUS CELL CARCINOMA EXCISION     preauricular on the right   TONSILLECTOMY  1947   VASECTOMY  1981    Allergies  Allergen Reactions   Donepezil Other (See Comments)    Per wife "has not done well."   Other Other (See Comments)    Vicryl sutures, pte. Doesn't know what reaction he had, "Drs. Owens Shark + Yeatts deetrmined not to use it on me"    Social History   Tobacco Use   Smoking status: Former    Packs/day: 0.25    Years: 3.00    Pack years: 0.75    Types: Cigarettes    Quit date: 08/21/1966    Years since quitting: 55.2   Smokeless tobacco: Never  Substance Use Topics   Alcohol use: Yes    Comment: limited    Family History  Problem Relation Age of Onset   Heart disease Father    Heart attack Father    Alzheimer's disease Mother    Heart failure Brother    Colon cancer Neg Hx    Stomach cancer Neg Hx    Esophageal cancer Neg Hx    Rectal cancer Neg Hx    Liver cancer Neg Hx      Review of Systems  Positive ROS: as above  All other systems have been reviewed and were otherwise negative with the exception of those mentioned in the HPI and as above.  Objective: Vital signs in last 24 hours: Temp:  [98.6 F (37 C)] 98.6 F (37 C) (01/09  1007) Pulse Rate:  [69] 69 (01/09 1007) Resp:  [20] 20 (01/09 1007) BP: (186)/(111) 186/111 (01/09 1007) SpO2:  [98 %] 98 % (01/09 1007)  General Appearance: Alert, cooperative, no distress, appears stated age Head: Normocephalic, without obvious abnormality, laceration to head Eyes: PERRL, conjunctiva/corneas clear, EOM's intact, fundi benign, both eyes      Neck: Supple, symmetrical, trachea midline, no adenopathy; thyroid: No enlargement/tenderness/nodules; no carotid bruit or JVD Back: Symmetric, no curvature, ROM normal, no CVA tenderness Lungs:  respirations unlabored Heart: Regular rate and rhythm Extremities: Extremities normal, atraumatic, no cyanosis or edema Pulses: 2+ and symmetric all extremities Skin: Skin color, texture, turgor normal, no rashes or lesions  NEUROLOGIC:   Mental status: A&O x4, no aphasia, good attention span, Memory and fund of knowledge Motor Exam - grossly normal, normal tone and bulk Sensory Exam - grossly normal Reflexes: symmetric, no pathologic reflexes, No Hoffman's, No clonus Coordination - grossly normal Gait - not tested Balance - not tested Cranial Nerves: I: smell Not tested  II: visual acuity  OS: na  OD: na  II: visual fields Full to confrontation  II: pupils Equal, round, reactive to light  III,VII: ptosis None  III,IV,VI: extraocular muscles  Full ROM  V: mastication Normal  V: facial light touch sensation  Normal  V,VII: corneal reflex  Present  VII: facial muscle function - upper  Normal  VII: facial muscle function - lower Normal  VIII: hearing Not tested  IX: soft palate elevation  Normal  IX,X: gag reflex Present  XI: trapezius strength  5/5  XI: sternocleidomastoid strength 5/5  XI: neck flexion strength  5/5  XII: tongue strength  Normal    Data Review Lab Results  Component Value Date   WBC 11.1 (H) 11/21/2021   HGB 17.7 (H) 11/21/2021   HCT 54.0 (H) 11/21/2021   MCV 93.3 11/21/2021   PLT 198 11/21/2021    Lab Results  Component Value Date   NA 138 11/21/2021   K 5.2 (H) 11/21/2021   CL 104 11/21/2021   CO2 24 11/21/2021   BUN 20 11/21/2021   CREATININE 1.10 11/21/2021   GLUCOSE 126 (H) 11/21/2021   No results found for: INR, PROTIME  Radiology: DG Wrist Complete Right  Result Date: 11/21/2021 CLINICAL DATA:  Trauma, pain EXAM: RIGHT WRIST - COMPLETE 3+ VIEW COMPARISON:  None. FINDINGS: No recent fracture or dislocation is seen. Degenerative changes are noted with bony spurs in first carpometacarpal joint and the interphalangeal joint of thumb. Minimal bony spurs seen in the lateral radiocarpal joint. IMPRESSION: No recent fracture or dislocation is seen in the right wrist. Electronically Signed   By: Elmer Picker M.D.   On: 11/21/2021 11:29   CT Head Wo Contrast  Addendum Date: 11/21/2021   ADDENDUM REPORT: 11/21/2021 11:59 ADDENDUM: Posterior scalp laceration. Electronically Signed   By: Logan Bores M.D.   On: 11/21/2021 11:59   Result Date: 11/21/2021 CLINICAL DATA:  Head trauma.  Fall today.  Head and neck pain. EXAM: CT HEAD WITHOUT CONTRAST CT CERVICAL SPINE WITHOUT CONTRAST TECHNIQUE: Multidetector CT imaging of the head and cervical spine was performed following the standard protocol without intravenous contrast. Multiplanar CT image reconstructions of the cervical spine were also generated. COMPARISON:  CT head and cervical spine 03/05/2021 FINDINGS: CT HEAD FINDINGS Brain: A new small hyperdense subdural hematoma over the left lateral cerebral convexity measures up to 3 mm in thickness without mass effect. Trace subdural hemorrhage is also present along the posterior aspect of the falx and along the left greater than right tentorial leaflets, and there is also new trace hypoattenuating subdural fluid or blood over the right cerebral convexity. There is minimal acute hemorrhage in the fourth ventricle. There is also a small extra-axial focus of curvilinear hyperdensity along the  posterolateral aspect of third ventricle coursing towards the pineal region (series 4, image 16). No acute cortically based infarct or midline shift is evident. There is mild cerebral atrophy. Patchy hypodensities in the cerebral white matter bilaterally are unchanged and nonspecific but compatible with moderate chronic small vessel ischemic disease. Vascular: Calcified atherosclerosis at the skull base. Skull: No acute fracture or suspicious osseous lesion. Sinuses/Orbits: Clear paranasal sinuses. Unchanged large right mastoid effusion. Right eyelid weight. Bilateral cataract extraction. Other: Postoperative changes involving the right parotid gland. CT CERVICAL SPINE FINDINGS Alignment: Chronic trace anterolisthesis of C7 on T1. Skull base and vertebrae: No acute fracture or suspicious osseous lesion. Soft tissues and spinal canal: No prevertebral fluid or swelling. No visible canal hematoma. Disc levels: Multilevel disc degeneration, moderate  at C6-7. Asymmetrically advanced right-sided cervical facet arthrosis at multiple levels. Right facet ankylosis at C2-3. Moderate multilevel neural foraminal stenosis and at least mild multilevel spinal stenosis. Upper chest: Clear lung apices. Other: Postoperative changes in the right upper neck. IMPRESSION: 1. Small acute bilateral subdural hematomas without mass effect. 2. Small volume acute hemorrhage in the fourth ventricle. 3. Small hyperdensity along the posterolateral aspect of the third ventricle on the right, likely additional extra-axial hemorrhage although deep cerebral vein thrombosis is an alternative consideration. 4. Moderate chronic small vessel ischemic disease. 5. No acute cervical spine fracture. Critical Value/emergent results were called by telephone at the time of interpretation on 11/21/2021 at 11:47 am to provider Myna Bright, who verbally acknowledged these results. Electronically Signed: By: Logan Bores M.D. On: 11/21/2021 11:54   CT Cervical  Spine Wo Contrast  Addendum Date: 11/21/2021   ADDENDUM REPORT: 11/21/2021 11:59 ADDENDUM: Posterior scalp laceration. Electronically Signed   By: Logan Bores M.D.   On: 11/21/2021 11:59   Result Date: 11/21/2021 CLINICAL DATA:  Head trauma.  Fall today.  Head and neck pain. EXAM: CT HEAD WITHOUT CONTRAST CT CERVICAL SPINE WITHOUT CONTRAST TECHNIQUE: Multidetector CT imaging of the head and cervical spine was performed following the standard protocol without intravenous contrast. Multiplanar CT image reconstructions of the cervical spine were also generated. COMPARISON:  CT head and cervical spine 03/05/2021 FINDINGS: CT HEAD FINDINGS Brain: A new small hyperdense subdural hematoma over the left lateral cerebral convexity measures up to 3 mm in thickness without mass effect. Trace subdural hemorrhage is also present along the posterior aspect of the falx and along the left greater than right tentorial leaflets, and there is also new trace hypoattenuating subdural fluid or blood over the right cerebral convexity. There is minimal acute hemorrhage in the fourth ventricle. There is also a small extra-axial focus of curvilinear hyperdensity along the posterolateral aspect of third ventricle coursing towards the pineal region (series 4, image 16). No acute cortically based infarct or midline shift is evident. There is mild cerebral atrophy. Patchy hypodensities in the cerebral white matter bilaterally are unchanged and nonspecific but compatible with moderate chronic small vessel ischemic disease. Vascular: Calcified atherosclerosis at the skull base. Skull: No acute fracture or suspicious osseous lesion. Sinuses/Orbits: Clear paranasal sinuses. Unchanged large right mastoid effusion. Right eyelid weight. Bilateral cataract extraction. Other: Postoperative changes involving the right parotid gland. CT CERVICAL SPINE FINDINGS Alignment: Chronic trace anterolisthesis of C7 on T1. Skull base and vertebrae: No acute  fracture or suspicious osseous lesion. Soft tissues and spinal canal: No prevertebral fluid or swelling. No visible canal hematoma. Disc levels: Multilevel disc degeneration, moderate at C6-7. Asymmetrically advanced right-sided cervical facet arthrosis at multiple levels. Right facet ankylosis at C2-3. Moderate multilevel neural foraminal stenosis and at least mild multilevel spinal stenosis. Upper chest: Clear lung apices. Other: Postoperative changes in the right upper neck. IMPRESSION: 1. Small acute bilateral subdural hematomas without mass effect. 2. Small volume acute hemorrhage in the fourth ventricle. 3. Small hyperdensity along the posterolateral aspect of the third ventricle on the right, likely additional extra-axial hemorrhage although deep cerebral vein thrombosis is an alternative consideration. 4. Moderate chronic small vessel ischemic disease. 5. No acute cervical spine fracture. Critical Value/emergent results were called by telephone at the time of interpretation on 11/21/2021 at 11:47 am to provider Myna Bright, who verbally acknowledged these results. Electronically Signed: By: Logan Bores M.D. On: 11/21/2021 11:54     Assessment/Plan: 81 year old  male presented to the ED after sustaining  fall at [facility] where he and his wife reside. CT head shows a small acute bilateral SDH with no mass effect, small volume hemorrhage in the 4th ventricle. No surgical intervention warranted at this time. Would recommend medicine admission to figure out the source of his frequent falls. Neuro checks Q4 hours with close observation.    Ocie Cornfield Elverta Dimiceli 11/21/2021 12:52 PM

## 2021-11-21 NOTE — Assessment & Plan Note (Signed)
Continue home cpap

## 2021-11-21 NOTE — ED Triage Notes (Signed)
Pt bib [facility] from [place] where he lives with wife. Wife left for a short period of time and came back to find pt on the ground outside with walker. Pt arrives with small abrasions on hands and laceration on back of head. Pt is at baseline with dementia, AOx3. C-Collar in place. Pt complains of left ankle pain and the denies pain. EMS vitals: 155CBG, 180/98, 99% RA

## 2021-11-21 NOTE — Progress Notes (Signed)
Reviewed and compared EKGs LBBB, no STEMI. No chest pain.    Nigel Mormon, MD Pager: [phone] Office: [phone]

## 2021-11-21 NOTE — Assessment & Plan Note (Signed)
Appears hemoconcentrated and dry with skin tenting Small fluid bolus Repeat potassium

## 2021-11-21 NOTE — Assessment & Plan Note (Signed)
From his treatment/surgeries to his skin cancer on neck/face Known to aspirate per neurology notes His daughter states he has been seen and evaluated by slp in the past, decline eval while here today

## 2021-11-21 NOTE — Assessment & Plan Note (Addendum)
Elevated, start back home medication and monitor IV parameters prn. Hx of bradycardia, will use hydralazine prn

## 2021-11-21 NOTE — ED Provider Notes (Signed)
[facility] EMERGENCY DEPARTMENT Provider Note   CSN: [id number] Arrival date & time: 11/21/21  1001   LEVEL 5 CAVEAT: Dementia   History Chief Complaint  Patient presents with   Jeffrey [name] is a 81 y.o. male with history of dementia and frequent falls who presents to the emergency department with an unwitnessed fall that occurred just prior to arrival.  Wife is at bedside and provided most of the history.  She states that she was on her way home from water aerobics when she found her husband on the ground and unconscious.  He was arousable however per the wife.  Patient has a laceration to the occipital head and a contusion to the right parietal scalp.  Also has an abrasion to the right wrist.  Patient Active Problem List   Diagnosis Date Noted   Dementia without behavioral disturbance ([place]) 05/25/2021   Apraxia on examination 05/25/2021   Frequent falls 05/25/2021   PSVT (paroxysmal supraventricular tachycardia) ([place]) 12/22/2020   Subdural hematoma 09/30/2020   SAH (subarachnoid hemorrhage) ([place]) 09/30/2020   Syncope and collapse 02/17/2020   Bradycardia on ECG 02/17/2020   Hypersomnia with sleep apnea 02/17/2020   Carotid artery disease ([place]) 11/28/2016   HYPERGLYCEMIA 11/10/2008   LOCALIZED SUPERFICIAL SWELLING MASS OR LUMP 03/31/2008   MELANOMA OF SKIN, SITE UNSPECIFIED 02/11/2008   HYPERLIPIDEMIA 02/11/2008   HYPERTENSION, ESSENTIAL NOS 02/11/2008   HYPERPLASIA PROSTATE UNS W/O UR OBST & OTH LUTS 02/11/2008   SKIN CANCER, HX OF 02/11/2008   GEN OSTEOARTHROSIS INVOLVING MULTIPLE SITES 10/03/2007   PES PLANUS 10/03/2007     Fall      Home Medications Prior to Admission medications   Medication Sig Start Date End Date Taking? Authorizing Provider  citalopram (CELEXA) 10 MG tablet Take 1 tablet (10 mg total) by mouth daily. 12/22/20  Yes Dohmeier, Asencion Partridge, MD  ketoconazole (NIZORAL) 2 % shampoo Apply 1 application topically 3 (three) times  a week. 06/06/21  Yes [provider]  niacinamide 500 MG tablet Take 500 mg by mouth 2 (two) times daily with a meal.   Yes [provider]  ofloxacin (OCUFLOX) 0.3 % ophthalmic solution Place 1 drop into the right eye 2 (two) times daily. 10/13/21  Yes [provider]  ramipril (ALTACE) 2.5 MG capsule Take 2.5 mg by mouth daily. 11/11/19  Yes [provider]  rivastigmine (EXELON) 4.6 mg/24hr Place 1 patch (4.6 mg total) onto the skin daily. 05/25/21  Yes Dohmeier, Asencion Partridge, MD  triamcinolone cream (KENALOG) 0.1 % Apply 1 application topically daily. 10/19/21  Yes [provider]  valACYclovir (VALTREX) 500 MG tablet Take 500 mg by mouth 2 (two) times daily as needed. Flare up 08/15/21  Yes [provider]  atorvastatin (LIPITOR) 20 MG tablet Take 20 mg by mouth daily at 6 PM.     [provider]      Allergies    Donepezil and Other    Review of Systems   Review of Systems  Unable to perform ROS: Dementia   Physical Exam Updated Vital Signs BP (!) 150/87    Pulse (!) 31    Temp 98.6 F (37 C) (Oral)    Resp (!) 23    SpO2 96%  Physical Exam Vitals and nursing note reviewed.  Constitutional:      General: He is not in acute distress.    Appearance: Normal appearance.  HENT:     Head: Normocephalic.  Eyes:     General:        Right eye: No discharge.        Left eye: No discharge.  Cardiovascular:     Comments: Regular rate and rhythm.  S1/S2 are distinct without any evidence of murmur, rubs, or gallops.  Radial pulses are 2+ bilaterally.  Dorsalis pedis pulses are 2+ bilaterally.  No evidence of pedal edema. Pulmonary:     Comments: Clear to auscultation bilaterally.  Normal effort.  No respiratory distress.  No evidence of wheezes, rales, or rhonchi heard throughout. Abdominal:     General: Abdomen is flat. Bowel sounds are normal. There is no distension.     Tenderness: There is no abdominal tenderness. There is no  guarding or rebound.  Musculoskeletal:        General: Normal range of motion.     Cervical back: Neck supple.  Skin:    General: Skin is warm and dry.     Findings: No rash.  Neurological:     General: No focal deficit present.     Mental Status: He is alert.  Psychiatric:        Mood and Affect: Mood normal.        Behavior: Behavior normal.    ED Results / Procedures / Treatments   Labs (all labs ordered are listed, but only abnormal results are displayed) Labs Reviewed  BASIC METABOLIC PANEL - Abnormal; Notable for the following components:      Result Value   Potassium 5.2 (*)    Glucose, Bld 126 (*)    All other components within normal limits  CBC WITH DIFFERENTIAL/PLATELET - Abnormal; Notable for the following components:   WBC 11.1 (*)    Hemoglobin 17.7 (*)    HCT 54.0 (*)    Neutro Abs 9.1 (*)    Abs Immature Granulocytes 0.09 (*)    All other components within normal limits  CBG MONITORING, ED - Abnormal; Notable for the following components:   Glucose-Capillary 124 (*)    All other components within normal limits  URINALYSIS, ROUTINE W REFLEX MICROSCOPIC    EKG None  Radiology DG Wrist Complete Right  Result Date: 11/21/2021 CLINICAL DATA:  Trauma, pain EXAM: RIGHT WRIST - COMPLETE 3+ VIEW COMPARISON:  None. FINDINGS: No recent fracture or dislocation is seen. Degenerative changes are noted with bony spurs in first carpometacarpal joint and the interphalangeal joint of thumb. Minimal bony spurs seen in the lateral radiocarpal joint. IMPRESSION: No recent fracture or dislocation is seen in the right wrist. Electronically Signed   By: Elmer Picker M.D.   On: 11/21/2021 11:29   CT Head Wo Contrast  Addendum Date: 11/21/2021   ADDENDUM REPORT: 11/21/2021 11:59 ADDENDUM: Posterior scalp laceration. Electronically Signed   By: Logan Bores M.D.   On: 11/21/2021 11:59   Result Date: 11/21/2021 CLINICAL DATA:  Head trauma.  Fall today.  Head and neck pain.  EXAM: CT HEAD WITHOUT CONTRAST CT CERVICAL SPINE WITHOUT CONTRAST TECHNIQUE: Multidetector CT imaging of the head and cervical spine was performed following the standard protocol without intravenous contrast. Multiplanar CT image reconstructions of the cervical spine were also generated. COMPARISON:  CT head and cervical spine 03/05/2021 FINDINGS: CT HEAD FINDINGS Brain: A new small hyperdense subdural hematoma over the left lateral cerebral convexity measures up to 3 mm in thickness without mass effect. Trace subdural hemorrhage is also present along the posterior aspect of the falx and along the left greater than right  tentorial leaflets, and there is also new trace hypoattenuating subdural fluid or blood over the right cerebral convexity. There is minimal acute hemorrhage in the fourth ventricle. There is also a small extra-axial focus of curvilinear hyperdensity along the posterolateral aspect of third ventricle coursing towards the pineal region (series 4, image 16). No acute cortically based infarct or midline shift is evident. There is mild cerebral atrophy. Patchy hypodensities in the cerebral white matter bilaterally are unchanged and nonspecific but compatible with moderate chronic small vessel ischemic disease. Vascular: Calcified atherosclerosis at the skull base. Skull: No acute fracture or suspicious osseous lesion. Sinuses/Orbits: Clear paranasal sinuses. Unchanged large right mastoid effusion. Right eyelid weight. Bilateral cataract extraction. Other: Postoperative changes involving the right parotid gland. CT CERVICAL SPINE FINDINGS Alignment: Chronic trace anterolisthesis of C7 on T1. Skull base and vertebrae: No acute fracture or suspicious osseous lesion. Soft tissues and spinal canal: No prevertebral fluid or swelling. No visible canal hematoma. Disc levels: Multilevel disc degeneration, moderate at C6-7. Asymmetrically advanced right-sided cervical facet arthrosis at multiple levels. Right  facet ankylosis at C2-3. Moderate multilevel neural foraminal stenosis and at least mild multilevel spinal stenosis. Upper chest: Clear lung apices. Other: Postoperative changes in the right upper neck. IMPRESSION: 1. Small acute bilateral subdural hematomas without mass effect. 2. Small volume acute hemorrhage in the fourth ventricle. 3. Small hyperdensity along the posterolateral aspect of the third ventricle on the right, likely additional extra-axial hemorrhage although deep cerebral vein thrombosis is an alternative consideration. 4. Moderate chronic small vessel ischemic disease. 5. No acute cervical spine fracture. Critical Value/emergent results were called by telephone at the time of interpretation on 11/21/2021 at 11:47 am to provider Myna Bright, who verbally acknowledged these results. Electronically Signed: By: Logan Bores M.D. On: 11/21/2021 11:54   CT Cervical Spine Wo Contrast  Addendum Date: 11/21/2021   ADDENDUM REPORT: 11/21/2021 11:59 ADDENDUM: Posterior scalp laceration. Electronically Signed   By: Logan Bores M.D.   On: 11/21/2021 11:59   Result Date: 11/21/2021 CLINICAL DATA:  Head trauma.  Fall today.  Head and neck pain. EXAM: CT HEAD WITHOUT CONTRAST CT CERVICAL SPINE WITHOUT CONTRAST TECHNIQUE: Multidetector CT imaging of the head and cervical spine was performed following the standard protocol without intravenous contrast. Multiplanar CT image reconstructions of the cervical spine were also generated. COMPARISON:  CT head and cervical spine 03/05/2021 FINDINGS: CT HEAD FINDINGS Brain: A new small hyperdense subdural hematoma over the left lateral cerebral convexity measures up to 3 mm in thickness without mass effect. Trace subdural hemorrhage is also present along the posterior aspect of the falx and along the left greater than right tentorial leaflets, and there is also new trace hypoattenuating subdural fluid or blood over the right cerebral convexity. There is minimal acute  hemorrhage in the fourth ventricle. There is also a small extra-axial focus of curvilinear hyperdensity along the posterolateral aspect of third ventricle coursing towards the pineal region (series 4, image 16). No acute cortically based infarct or midline shift is evident. There is mild cerebral atrophy. Patchy hypodensities in the cerebral white matter bilaterally are unchanged and nonspecific but compatible with moderate chronic small vessel ischemic disease. Vascular: Calcified atherosclerosis at the skull base. Skull: No acute fracture or suspicious osseous lesion. Sinuses/Orbits: Clear paranasal sinuses. Unchanged large right mastoid effusion. Right eyelid weight. Bilateral cataract extraction. Other: Postoperative changes involving the right parotid gland. CT CERVICAL SPINE FINDINGS Alignment: Chronic trace anterolisthesis of C7 on T1. Skull base and vertebrae: No  acute fracture or suspicious osseous lesion. Soft tissues and spinal canal: No prevertebral fluid or swelling. No visible canal hematoma. Disc levels: Multilevel disc degeneration, moderate at C6-7. Asymmetrically advanced right-sided cervical facet arthrosis at multiple levels. Right facet ankylosis at C2-3. Moderate multilevel neural foraminal stenosis and at least mild multilevel spinal stenosis. Upper chest: Clear lung apices. Other: Postoperative changes in the right upper neck. IMPRESSION: 1. Small acute bilateral subdural hematomas without mass effect. 2. Small volume acute hemorrhage in the fourth ventricle. 3. Small hyperdensity along the posterolateral aspect of the third ventricle on the right, likely additional extra-axial hemorrhage although deep cerebral vein thrombosis is an alternative consideration. 4. Moderate chronic small vessel ischemic disease. 5. No acute cervical spine fracture. Critical Value/emergent results were called by telephone at the time of interpretation on 11/21/2021 at 11:47 am to provider Myna Bright, who  verbally acknowledged these results. Electronically Signed: By: Logan Bores M.D. On: 11/21/2021 11:54    Procedures .Marland KitchenLaceration Repair  Date/Time: 11/21/2021 2:54 PM Performed by: Hendricks Limes, PA-C Authorized by: Hendricks Limes, PA-C   Consent:    Consent obtained:  Verbal   Consent given by:  Patient and spouse   Risks discussed:  Infection, pain and poor cosmetic result Universal protocol:    Procedure explained and questions answered to patient or proxy's satisfaction: yes     Relevant documents present and verified: yes     Test results available: yes     Imaging studies available: yes     Required blood products, implants, devices, and special equipment available: no     Site/side marked: no     Immediately prior to procedure, a time out was called: no     Patient identity confirmed:  Verbally with patient and arm band Anesthesia:    Anesthesia method:  Local infiltration   Local anesthetic:  Lidocaine 1% w/o epi Laceration details:    Location:  Scalp   Scalp location:  Occipital   Length (cm):  5   Depth (mm):  10 Pre-procedure details:    Preparation:  Patient was prepped and draped in usual sterile fashion Exploration:    Limited defect created (wound extended): no     Hemostasis achieved with:  Direct pressure   Imaging obtained comment:  CT head   Wound exploration: wound explored through full range of motion and entire depth of wound visualized     Wound extent: areolar tissue violated     Contaminated: no   Treatment:    Area cleansed with:  Povidone-iodine   Amount of cleaning:  Standard   Irrigation solution:  Sterile saline   Irrigation method:  Pressure wash   Visualized foreign bodies/material removed: no     Debridement:  Minimal   Undermining:  Minimal   Scar revision: no   Skin repair:    Repair method:  Sutures and staples   Suture size:  3-0   Suture material:  Prolene   Suture technique:  Simple interrupted   Number of sutures:   1   Number of staples:  5 Approximation:    Approximation:  Loose Repair type:    Repair type:  Complex Post-procedure details:    Dressing:  Open (no dressing)   Procedure completion:  Tolerated well, no immediate complications   Patient has normal sinus rhythm and is slightly hypertensive.  Pulses normal.  Respirations normal.  Oxygenating well on room air.  Medications Ordered in ED Medications  atorvastatin (LIPITOR) tablet 20  mg (has no administration in time range)  lidocaine (PF) (XYLOCAINE) 1 % injection 30 mL (30 mLs Infiltration Given 11/21/21 1437)    ED Course/ Medical Decision Making/ A&P Clinical Course as of 11/21/21 1519  Mon Nov 21, 2021  1209 I spoke with Dr. Shelba Flake with neurosurgery who recommends observation tonight and to admit to the hospitalist.  [CF]    Clinical Course User Index [CF] Hendricks Limes, PA-C                           Medical Decision Making  MAUREEN DUESING is a 81 y.o. male who presents to the emergency department after an unwitnessed fall and subsequent occipital scalp laceration.  Given the mechanism of injury differential includes emergent intracranial pathology, neck fracture, syncope induced fall.  Old records were reviewed which revealed he has had frequent visits to the emergency department secondary to falls in the past year.  Significant comorbidities that will impact his care include dementia.  Will obtain basic labs and imaging of the head, neck, and right wrist for further evaluation.  I will personally interpret the studies.  Ultimately he will likely need the staples for the laceration of the occipital scalp.  Per the wife, he is at his baseline.  Vital signs are otherwise stable apart from elevated blood pressure. He is in no acute distress.   I personally reviewed the labs and images.  CBC showed mild leukocytosis and elevated hemoglobin slightly.  BMP showed slightly hyperkalemia.  No EKG changes.  CT head did show subdural with  trace amounts of blood in the ventricles.  CT cervical spine was normal.  Right wrist was negative.  I was contacted by radiology with results and agree with their interpretation.  Gave the patient his Lipitor medication as the wife said he has not had a in many days.  Blood pressure became elevated in the department but has since come down spontaneously.  I repaired his laceration.  Please see procedure note above which includes critical interventions use.  I spoke with neurosurgery who recommends overnight observation and to admit to the hospitalist service. I spoke with Dr. [name] Blocker who agrees to admit the patient.   Given the clinical scenario, I do believe patient would benefit from further evaluation in the hospital.   Final Clinical Impression(s) / ED Diagnoses Final diagnoses:  Subdural hematoma    Rx / DC Orders ED Discharge Orders     None         Hendricks Limes, [place] 11/21/21 1519    Lucrezia Starch, MD 11/23/21 0008

## 2021-11-21 NOTE — Assessment & Plan Note (Signed)
At baseline, monitor

## 2021-11-21 NOTE — Progress Notes (Addendum)
Patient agitated with elevated blood pressure and changes on ekg concerning for STEMI. Troponins have still not be drawn from 16:00. Have asked again. He denies any chest pain, but with his dementia unsure he would tell me if he was having chest pain. Called cardiology, Dr. Virgina Jock, didn't feel this was a true STEMI, but will be by to see patient. He has subdural hematomas, not a candidate for heparin and likely not candidate for intervention. Will f/u on cardiology recommendations.  Family aware.  -also having a hard time urinating, will check bladder scan -will try morphine for pain to see if helps with agitation, may need something else. Updated floor coverage, Dr. Bridgett Larsson.   Dr. [name] Blocker [facility]

## 2021-11-21 NOTE — Assessment & Plan Note (Signed)
Off statin due to myalgias, continue to hold Follow up with PCP

## 2021-11-21 NOTE — Assessment & Plan Note (Signed)
81 year old male with dementia and frequent falls who fell this AM and was found unconscious in parking lot by his wife. Imaging revealed bilateral subdural hematomas -observation to progressive with frequent neuro checks -neurosurgery consulted, no intervention at this time [facility] also reveals small hyperdensity along the posteriolateral aspect of the 3rd ventricle on the right side, likely additional extra-axial hemorrhage although deep cerebral vein thrombosis is an alternative. Consider MRI if indicated -hold AC, on no ASA or plavix at home

## 2021-11-21 NOTE — ED Notes (Signed)
Wife Vito Backers [phone] would like an update

## 2021-11-22 ENCOUNTER — Observation Stay (HOSPITAL_COMMUNITY): Payer: PPO

## 2021-11-22 ENCOUNTER — Encounter (HOSPITAL_COMMUNITY): Payer: Self-pay | Admitting: Family Medicine

## 2021-11-22 ENCOUNTER — Telehealth: Payer: Self-pay | Admitting: Neurology

## 2021-11-22 DIAGNOSIS — Z85828 Personal history of other malignant neoplasm of skin: Secondary | ICD-10-CM | POA: Diagnosis not present

## 2021-11-22 DIAGNOSIS — I6200 Nontraumatic subdural hemorrhage, unspecified: Secondary | ICD-10-CM | POA: Diagnosis not present

## 2021-11-22 DIAGNOSIS — Z20822 Contact with and (suspected) exposure to covid-19: Secondary | ICD-10-CM | POA: Diagnosis not present

## 2021-11-22 DIAGNOSIS — S60811A Abrasion of right wrist, initial encounter: Secondary | ICD-10-CM | POA: Diagnosis present

## 2021-11-22 DIAGNOSIS — S065X0A Traumatic subdural hemorrhage without loss of consciousness, initial encounter: Secondary | ICD-10-CM | POA: Diagnosis not present

## 2021-11-22 DIAGNOSIS — Z8673 Personal history of transient ischemic attack (TIA), and cerebral infarction without residual deficits: Secondary | ICD-10-CM | POA: Diagnosis not present

## 2021-11-22 DIAGNOSIS — R55 Syncope and collapse: Secondary | ICD-10-CM

## 2021-11-22 DIAGNOSIS — G309 Alzheimer's disease, unspecified: Secondary | ICD-10-CM | POA: Diagnosis not present

## 2021-11-22 DIAGNOSIS — S0101XA Laceration without foreign body of scalp, initial encounter: Secondary | ICD-10-CM | POA: Diagnosis not present

## 2021-11-22 DIAGNOSIS — H02102 Unspecified ectropion of right lower eyelid: Secondary | ICD-10-CM | POA: Diagnosis present

## 2021-11-22 DIAGNOSIS — I6529 Occlusion and stenosis of unspecified carotid artery: Secondary | ICD-10-CM | POA: Diagnosis not present

## 2021-11-22 DIAGNOSIS — G4733 Obstructive sleep apnea (adult) (pediatric): Secondary | ICD-10-CM | POA: Diagnosis present

## 2021-11-22 DIAGNOSIS — G301 Alzheimer's disease with late onset: Secondary | ICD-10-CM | POA: Diagnosis not present

## 2021-11-22 DIAGNOSIS — G936 Cerebral edema: Secondary | ICD-10-CM | POA: Diagnosis not present

## 2021-11-22 DIAGNOSIS — E7800 Pure hypercholesterolemia, unspecified: Secondary | ICD-10-CM | POA: Diagnosis present

## 2021-11-22 DIAGNOSIS — S065XAA Traumatic subdural hemorrhage with loss of consciousness status unknown, initial encounter: Secondary | ICD-10-CM | POA: Diagnosis not present

## 2021-11-22 DIAGNOSIS — Z9852 Vasectomy status: Secondary | ICD-10-CM | POA: Diagnosis not present

## 2021-11-22 DIAGNOSIS — Z66 Do not resuscitate: Secondary | ICD-10-CM | POA: Diagnosis present

## 2021-11-22 DIAGNOSIS — W19XXXA Unspecified fall, initial encounter: Secondary | ICD-10-CM | POA: Diagnosis not present

## 2021-11-22 DIAGNOSIS — I10 Essential (primary) hypertension: Secondary | ICD-10-CM | POA: Diagnosis not present

## 2021-11-22 DIAGNOSIS — Z888 Allergy status to other drugs, medicaments and biological substances status: Secondary | ICD-10-CM | POA: Diagnosis not present

## 2021-11-22 DIAGNOSIS — F02C18 Dementia in other diseases classified elsewhere, severe, with other behavioral disturbance: Secondary | ICD-10-CM | POA: Diagnosis not present

## 2021-11-22 DIAGNOSIS — I129 Hypertensive chronic kidney disease with stage 1 through stage 4 chronic kidney disease, or unspecified chronic kidney disease: Secondary | ICD-10-CM | POA: Diagnosis not present

## 2021-11-22 DIAGNOSIS — Z79899 Other long term (current) drug therapy: Secondary | ICD-10-CM | POA: Diagnosis not present

## 2021-11-22 DIAGNOSIS — S066X0A Traumatic subarachnoid hemorrhage without loss of consciousness, initial encounter: Secondary | ICD-10-CM | POA: Diagnosis not present

## 2021-11-22 DIAGNOSIS — R339 Retention of urine, unspecified: Secondary | ICD-10-CM | POA: Diagnosis present

## 2021-11-22 DIAGNOSIS — R296 Repeated falls: Secondary | ICD-10-CM | POA: Diagnosis present

## 2021-11-22 DIAGNOSIS — I615 Nontraumatic intracerebral hemorrhage, intraventricular: Secondary | ICD-10-CM | POA: Diagnosis not present

## 2021-11-22 DIAGNOSIS — R1310 Dysphagia, unspecified: Secondary | ICD-10-CM | POA: Diagnosis present

## 2021-11-22 DIAGNOSIS — I21A1 Myocardial infarction type 2: Secondary | ICD-10-CM | POA: Diagnosis not present

## 2021-11-22 DIAGNOSIS — I4891 Unspecified atrial fibrillation: Secondary | ICD-10-CM | POA: Diagnosis not present

## 2021-11-22 DIAGNOSIS — F02B4 Dementia in other diseases classified elsewhere, moderate, with anxiety: Secondary | ICD-10-CM | POA: Diagnosis not present

## 2021-11-22 DIAGNOSIS — R748 Abnormal levels of other serum enzymes: Secondary | ICD-10-CM | POA: Diagnosis not present

## 2021-11-22 DIAGNOSIS — Z86010 Personal history of colonic polyps: Secondary | ICD-10-CM | POA: Diagnosis not present

## 2021-11-22 DIAGNOSIS — E875 Hyperkalemia: Secondary | ICD-10-CM | POA: Diagnosis present

## 2021-11-22 DIAGNOSIS — N1831 Chronic kidney disease, stage 3a: Secondary | ICD-10-CM | POA: Diagnosis not present

## 2021-11-22 DIAGNOSIS — R9431 Abnormal electrocardiogram [ECG] [EKG]: Secondary | ICD-10-CM | POA: Diagnosis not present

## 2021-11-22 DIAGNOSIS — I447 Left bundle-branch block, unspecified: Secondary | ICD-10-CM | POA: Diagnosis present

## 2021-11-22 DIAGNOSIS — G9340 Encephalopathy, unspecified: Secondary | ICD-10-CM | POA: Diagnosis not present

## 2021-11-22 LAB — ECHOCARDIOGRAM COMPLETE
AR max vel: 2.32 cm2
AV Area VTI: 2.33 cm2
AV Area mean vel: 2.6 cm2
AV Mean grad: 3 mmHg
AV Peak grad: 5.1 mmHg
Ao pk vel: 1.13 m/s
Area-P 1/2: 5.54 cm2
Calc EF: 60.1 %
Height: 72 in
S' Lateral: 2.8 cm
Single Plane A2C EF: 62.3 %
Single Plane A4C EF: 54.8 %
Weight: 2726.65 oz

## 2021-11-22 LAB — PROTIME-INR
INR: 1.1 (ref 0.8–1.2)
Prothrombin Time: 14 seconds (ref 11.4–15.2)

## 2021-11-22 LAB — BASIC METABOLIC PANEL
Anion gap: 11 (ref 5–15)
BUN: 17 mg/dL (ref 8–23)
CO2: 24 mmol/L (ref 22–32)
Calcium: 8.9 mg/dL (ref 8.9–10.3)
Chloride: 102 mmol/L (ref 98–111)
Creatinine, Ser: 1.08 mg/dL (ref 0.61–1.24)
GFR, Estimated: >60 mL/min (ref >60)
Glucose, Bld: 111 mg/dL — ABNORMAL HIGH (ref 70–99)
Potassium: 3.9 mmol/L (ref 3.5–5.1)
Sodium: 137 mmol/L (ref 135–145)

## 2021-11-22 LAB — AMMONIA: Ammonia: 29 umol/L (ref 9–35)

## 2021-11-22 LAB — CBC
HCT: 48.7 % (ref 39.0–52.0)
Hemoglobin: 15.9 g/dL (ref 13.0–17.0)
MCH: 29.7 pg (ref 26.0–34.0)
MCHC: 32.6 g/dL (ref 30.0–36.0)
MCV: 90.9 fL (ref 80.0–100.0)
Platelets: 199 10*3/uL (ref 150–400)
RBC: 5.36 MIL/uL (ref 4.22–5.81)
RDW: 13.2 % (ref 11.5–15.5)
WBC: 12.1 10*3/uL — ABNORMAL HIGH (ref 4.0–10.5)
nRBC: 0 % (ref 0.0–0.2)

## 2021-11-22 LAB — TROPONIN I (HIGH SENSITIVITY): Troponin I (High Sensitivity): 337 ng/L (ref <18)

## 2021-11-22 LAB — CBG MONITORING, ED: Glucose-Capillary: 144 mg/dL — ABNORMAL HIGH (ref 70–99)

## 2021-11-22 LAB — VITAMIN B12: Vitamin B-12: 253 pg/mL (ref 180–914)

## 2021-11-22 MED ORDER — LACTATED RINGERS IV BOLUS
500.0000 mL | Freq: Once | INTRAVENOUS | Status: AC
  Administered 2021-11-22: 500 mL via INTRAVENOUS

## 2021-11-22 MED ORDER — LORAZEPAM 2 MG/ML IJ SOLN
0.5000 mg | Freq: Once | INTRAMUSCULAR | Status: DC
Start: 2021-11-22 — End: 2021-11-22
  Filled 2021-11-22: qty 1

## 2021-11-22 MED ORDER — MORPHINE SULFATE (PF) 2 MG/ML IV SOLN
2.0000 mg | Freq: Once | INTRAVENOUS | Status: AC
  Administered 2021-11-22: 2 mg via INTRAVENOUS
  Filled 2021-11-22: qty 1

## 2021-11-22 MED ORDER — QUETIAPINE FUMARATE 25 MG PO TABS
12.5000 mg | ORAL_TABLET | Freq: Two times a day (BID) | ORAL | Status: DC | PRN
  Administered 2021-11-22: 12.5 mg via ORAL
  Filled 2021-11-22 (×3): qty 1

## 2021-11-22 MED ORDER — HYDRALAZINE HCL 20 MG/ML IJ SOLN
5.0000 mg | Freq: Four times a day (QID) | INTRAMUSCULAR | Status: DC | PRN
  Administered 2021-11-23: 5 mg via INTRAVENOUS
  Filled 2021-11-22 (×2): qty 1

## 2021-11-22 MED ORDER — ACETAMINOPHEN 500 MG PO TABS
500.0000 mg | ORAL_TABLET | Freq: Three times a day (TID) | ORAL | Status: DC
  Administered 2021-11-22 – 2021-11-25 (×10): 500 mg via ORAL
  Filled 2021-11-22 (×11): qty 1

## 2021-11-22 MED ORDER — SODIUM CHLORIDE 0.9 % IV SOLN: INTRAVENOUS | Status: DC

## 2021-11-22 MED ORDER — MORPHINE SULFATE (PF) 2 MG/ML IV SOLN: 0.5000 mg | INTRAVENOUS | Status: DC | PRN

## 2021-11-22 MED ORDER — HALOPERIDOL LACTATE 5 MG/ML IJ SOLN: 2.0000 mg | Freq: Once | INTRAMUSCULAR | Status: DC

## 2021-11-22 NOTE — ED Notes (Signed)
Patient transported to CT 

## 2021-11-22 NOTE — ED Notes (Signed)
Dressing changed and rewrapped

## 2021-11-22 NOTE — Progress Notes (Signed)
Subjective: Patient resting quietly after Tylenol and Seroquel.  No agitation.  Breathing comfortably.  I spent quite a long time speaking with his wife.  Objective: Vital signs in last 24 hours: Temp:  [99.1 F (37.3 C)] 99.1 F (37.3 C) (01/10 1558) Pulse Rate:  [61-108] 84 (01/10 1558) Resp:  [14-27] 14 (01/10 1558) BP: (80-184)/(57-106) 132/92 (01/10 1558) SpO2:  [91 %-99 %] 96 % (01/10 1558) Weight:  [77.3 kg] 77.3 kg (01/10 0759)  Intake/Output from previous day: 01/09 0701 - 01/10 0700 In: 750.5 [IV Piggyback:750.5] Out: -  Intake/Output this shift: No intake/output data recorded.    Lab Results: Lab Results  Component Value Date   WBC 12.1 (H) 11/22/2021   HGB 15.9 11/22/2021   HCT 48.7 11/22/2021   MCV 90.9 11/22/2021   PLT 199 11/22/2021   Lab Results  Component Value Date   INR 1.1 11/22/2021   BMET Lab Results  Component Value Date   NA 137 11/22/2021   K 3.9 11/22/2021   CL 102 11/22/2021   CO2 24 11/22/2021   GLUCOSE 111 (H) 11/22/2021   BUN 17 11/22/2021   CREATININE 1.08 11/22/2021   CALCIUM 8.9 11/22/2021    Studies/Results: DG Wrist 2 Views Right  Result Date: 11/21/2021 CLINICAL DATA:  Fall, dementia EXAM: RIGHT WRIST - 2 VIEW COMPARISON:  None. FINDINGS: There is no evidence of fracture or dislocation. There is no evidence of arthropathy or other focal bone abnormality. Soft tissues are unremarkable. IMPRESSION: Negative. Electronically Signed   By: Keane Police D.O.   On: 11/21/2021 16:19   DG Wrist Complete Right  Result Date: 11/21/2021 CLINICAL DATA:  Trauma, pain EXAM: RIGHT WRIST - COMPLETE 3+ VIEW COMPARISON:  None. FINDINGS: No recent fracture or dislocation is seen. Degenerative changes are noted with bony spurs in first carpometacarpal joint and the interphalangeal joint of thumb. Minimal bony spurs seen in the lateral radiocarpal joint. IMPRESSION: No recent fracture or dislocation is seen in the right wrist. Electronically Signed    By: Elmer Picker M.D.   On: 11/21/2021 11:29   CT HEAD WO CONTRAST (5MM)  Result Date: 11/22/2021 CLINICAL DATA:  Intracranial bleeding, trauma EXAM: CT HEAD WITHOUT CONTRAST TECHNIQUE: Contiguous axial images were obtained from the base of the skull through the vertex without intravenous contrast. COMPARISON:  11/21/2021 FINDINGS: Brain: There is interval appearance of large lobulated intra cerebral hematoma measuring a proximally 4.7 x 2.9 x 2.8 cm in the left posterior temporal lobe. Estimated volume is approximately 19 mL. There is surrounding edema. There is 6 mm area of bleeding in the right posterior third ventricle. There is interval layering of small amount of blood in the dependent portions of occipital lobes in both lateral ventricles. Small 3 mm subdural hematoma seen in the periphery of left cerebral hemisphere is less evident. There is increase in amount of CSF density adjacent to both frontal lobes, more so on the right side measuring up to 10 mm in the diameter. There is no shift of midline structures. There is increased density between the leaves tentorium with interval worsening more so on the left side. There is subtle increased density in the posterior right sylvian fissure, possibly presence of subarachnoid hemorrhage. Similar finding to a lesser extent is seen in the posterior left sylvian fissure. Vascular: Scattered arterial calcifications are seen. Skull: No displaced fracture is seen. Sinuses/Orbits: Sinuses are unremarkable. There is a metallic density in the anterior right orbit. IMPRESSION: There is interval appearance of 4.7  x 2.9 x 2.8 cm lobulated intra cerebral hematoma in the posterior left temporal lobe. There is interval increase in the amount of blood in the third and both lateral ventricles. There is increased amount of blood between the leaves of the tentorium, more so on the left side. There is subtle increased density in the posterior aspects of sylvian fissure on  both sides, more so on the right side possibly suggesting subarachnoid hemorrhage. 3 mm acute left subdural hematoma seen in the previous examination appears less prominent. There is increased amount of CSF density in the periphery of both frontal lobes, more so on the right side measuring up to 10 mm in thickness without definite evidence of recent bleeding. Imaging findings were relayed to Dr. Tyrell Antonio by telephone call. She will contact neurosurgeon. Electronically Signed   By: Elmer Picker M.D.   On: 11/22/2021 11:41   CT Head Wo Contrast  Addendum Date: 11/21/2021   ADDENDUM REPORT: 11/21/2021 11:59 ADDENDUM: Posterior scalp laceration. Electronically Signed   By: Logan Bores M.D.   On: 11/21/2021 11:59   Result Date: 11/21/2021 CLINICAL DATA:  Head trauma.  Fall today.  Head and neck pain. EXAM: CT HEAD WITHOUT CONTRAST CT CERVICAL SPINE WITHOUT CONTRAST TECHNIQUE: Multidetector CT imaging of the head and cervical spine was performed following the standard protocol without intravenous contrast. Multiplanar CT image reconstructions of the cervical spine were also generated. COMPARISON:  CT head and cervical spine 03/05/2021 FINDINGS: CT HEAD FINDINGS Brain: A new small hyperdense subdural hematoma over the left lateral cerebral convexity measures up to 3 mm in thickness without mass effect. Trace subdural hemorrhage is also present along the posterior aspect of the falx and along the left greater than right tentorial leaflets, and there is also new trace hypoattenuating subdural fluid or blood over the right cerebral convexity. There is minimal acute hemorrhage in the fourth ventricle. There is also a small extra-axial focus of curvilinear hyperdensity along the posterolateral aspect of third ventricle coursing towards the pineal region (series 4, image 16). No acute cortically based infarct or midline shift is evident. There is mild cerebral atrophy. Patchy hypodensities in the cerebral white  matter bilaterally are unchanged and nonspecific but compatible with moderate chronic small vessel ischemic disease. Vascular: Calcified atherosclerosis at the skull base. Skull: No acute fracture or suspicious osseous lesion. Sinuses/Orbits: Clear paranasal sinuses. Unchanged large right mastoid effusion. Right eyelid weight. Bilateral cataract extraction. Other: Postoperative changes involving the right parotid gland. CT CERVICAL SPINE FINDINGS Alignment: Chronic trace anterolisthesis of C7 on T1. Skull base and vertebrae: No acute fracture or suspicious osseous lesion. Soft tissues and spinal canal: No prevertebral fluid or swelling. No visible canal hematoma. Disc levels: Multilevel disc degeneration, moderate at C6-7. Asymmetrically advanced right-sided cervical facet arthrosis at multiple levels. Right facet ankylosis at C2-3. Moderate multilevel neural foraminal stenosis and at least mild multilevel spinal stenosis. Upper chest: Clear lung apices. Other: Postoperative changes in the right upper neck. IMPRESSION: 1. Small acute bilateral subdural hematomas without mass effect. 2. Small volume acute hemorrhage in the fourth ventricle. 3. Small hyperdensity along the posterolateral aspect of the third ventricle on the right, likely additional extra-axial hemorrhage although deep cerebral vein thrombosis is an alternative consideration. 4. Moderate chronic small vessel ischemic disease. 5. No acute cervical spine fracture. Critical Value/emergent results were called by telephone at the time of interpretation on 11/21/2021 at 11:47 am to provider Myna Bright, who verbally acknowledged these results. Electronically Signed: By: Logan Bores  M.D. On: 11/21/2021 11:54   CT Cervical Spine Wo Contrast  Addendum Date: 11/21/2021   ADDENDUM REPORT: 11/21/2021 11:59 ADDENDUM: Posterior scalp laceration. Electronically Signed   By: Logan Bores M.D.   On: 11/21/2021 11:59   Result Date: 11/21/2021 CLINICAL DATA:  Head  trauma.  Fall today.  Head and neck pain. EXAM: CT HEAD WITHOUT CONTRAST CT CERVICAL SPINE WITHOUT CONTRAST TECHNIQUE: Multidetector CT imaging of the head and cervical spine was performed following the standard protocol without intravenous contrast. Multiplanar CT image reconstructions of the cervical spine were also generated. COMPARISON:  CT head and cervical spine 03/05/2021 FINDINGS: CT HEAD FINDINGS Brain: A new small hyperdense subdural hematoma over the left lateral cerebral convexity measures up to 3 mm in thickness without mass effect. Trace subdural hemorrhage is also present along the posterior aspect of the falx and along the left greater than right tentorial leaflets, and there is also new trace hypoattenuating subdural fluid or blood over the right cerebral convexity. There is minimal acute hemorrhage in the fourth ventricle. There is also a small extra-axial focus of curvilinear hyperdensity along the posterolateral aspect of third ventricle coursing towards the pineal region (series 4, image 16). No acute cortically based infarct or midline shift is evident. There is mild cerebral atrophy. Patchy hypodensities in the cerebral white matter bilaterally are unchanged and nonspecific but compatible with moderate chronic small vessel ischemic disease. Vascular: Calcified atherosclerosis at the skull base. Skull: No acute fracture or suspicious osseous lesion. Sinuses/Orbits: Clear paranasal sinuses. Unchanged large right mastoid effusion. Right eyelid weight. Bilateral cataract extraction. Other: Postoperative changes involving the right parotid gland. CT CERVICAL SPINE FINDINGS Alignment: Chronic trace anterolisthesis of C7 on T1. Skull base and vertebrae: No acute fracture or suspicious osseous lesion. Soft tissues and spinal canal: No prevertebral fluid or swelling. No visible canal hematoma. Disc levels: Multilevel disc degeneration, moderate at C6-7. Asymmetrically advanced right-sided cervical  facet arthrosis at multiple levels. Right facet ankylosis at C2-3. Moderate multilevel neural foraminal stenosis and at least mild multilevel spinal stenosis. Upper chest: Clear lung apices. Other: Postoperative changes in the right upper neck. IMPRESSION: 1. Small acute bilateral subdural hematomas without mass effect. 2. Small volume acute hemorrhage in the fourth ventricle. 3. Small hyperdensity along the posterolateral aspect of the third ventricle on the right, likely additional extra-axial hemorrhage although deep cerebral vein thrombosis is an alternative consideration. 4. Moderate chronic small vessel ischemic disease. 5. No acute cervical spine fracture. Critical Value/emergent results were called by telephone at the time of interpretation on 11/21/2021 at 11:47 am to provider Myna Bright, who verbally acknowledged these results. Electronically Signed: By: Logan Bores M.D. On: 11/21/2021 11:54   ECHOCARDIOGRAM COMPLETE  Result Date: 11/22/2021    ECHOCARDIOGRAM REPORT   Patient Name:   NITISH ROES [facility] Date of Exam: 11/22/2021 Medical Rec #:  [id number]        Height:       72.0 in Accession #:    [id number]       Weight:       170.5 lb Date of Birth:  1941/02/21        BSA:          1.991 m Patient Age:    54 years         BP:           107/67 mmHg Patient Gender: M                HR:  82 bpm. Exam Location:  Inpatient Procedure: 2D Echo, Cardiac Doppler and Color Doppler Indications:    syncope  History:        Patient has prior history of Echocardiogram examinations, most                 recent 08/29/2019. Risk Factors:Hypertension. Hyperlipidemia.  Sonographer:    Beryle Beams Referring Phys: [id number] [place]  1. Left ventricular ejection fraction, by estimation, is 55 to 60%. The left ventricle has normal function. Left ventricular endocardial border not optimally defined to evaluate regional wall motion. There may be hypokinesis of the apical segments of the inferior  wall and inferior septum. Left ventricular diastolic parameters are consistent with Grade I diastolic dysfunction (impaired relaxation).  2. Right ventricular systolic function is normal. The right ventricular size is normal.  3. The mitral valve is normal in structure. No evidence of mitral valve regurgitation. No evidence of mitral stenosis.  4. The aortic valve is normal in structure. Aortic valve regurgitation is not visualized. No aortic stenosis is present.  5. The inferior vena cava is normal in size with greater than 50% respiratory variability, suggesting right atrial pressure of 3 mmHg. FINDINGS  Left Ventricle: Left ventricular ejection fraction, by estimation, is 55 to 60%. The left ventricle has normal function. Left ventricular endocardial border not optimally defined to evaluate regional wall motion. The left ventricular internal cavity size was normal in size. There is no left ventricular hypertrophy. Left ventricular diastolic parameters are consistent with Grade I diastolic dysfunction (impaired relaxation). Indeterminate filling pressures. Right Ventricle: Ivc not seen. The right ventricular size is normal. No increase in right ventricular wall thickness. Right ventricular systolic function is normal. Left Atrium: Left atrial size was normal in size. Right Atrium: Right atrial size was normal in size. Pericardium: Trivial pericardial effusion is present. Mitral Valve: The mitral valve is normal in structure. No evidence of mitral valve regurgitation. No evidence of mitral valve stenosis. Tricuspid Valve: The tricuspid valve is normal in structure. Tricuspid valve regurgitation is not demonstrated. No evidence of tricuspid stenosis. Aortic Valve: The aortic valve is normal in structure. Aortic valve regurgitation is not visualized. No aortic stenosis is present. Aortic valve mean gradient measures 3.0 mmHg. Aortic valve peak gradient measures 5.1 mmHg. Aortic valve area, by VTI measures 2.33 cm.  Pulmonic Valve: The pulmonic valve was normal in structure. Pulmonic valve regurgitation is not visualized. No evidence of pulmonic stenosis. Aorta: The aortic root is normal in size and structure. Venous: The inferior vena cava is normal in size with greater than 50% respiratory variability, suggesting right atrial pressure of 3 mmHg. IAS/Shunts: No atrial level shunt detected by color flow Doppler.  LEFT VENTRICLE PLAX 2D LVIDd:         4.80 cm     Diastology LVIDs:         2.80 cm     LV e' medial:    4.46 cm/s LV PW:         1.10 cm     LV E/e' medial:  12.1 LV IVS:        0.70 cm     LV e' lateral:   4.46 cm/s LVOT diam:     1.80 cm     LV E/e' lateral: 12.1 LV SV:         48 LV SV Index:   24 LVOT Area:     2.54 cm  LV Volumes (MOD) LV vol d, MOD  A2C: 35.3 ml LV vol d, MOD A4C: 42.3 ml LV vol s, MOD A2C: 13.3 ml LV vol s, MOD A4C: 19.1 ml LV SV MOD A2C:     22.0 ml LV SV MOD A4C:     42.3 ml LV SV MOD BP:      24.4 ml RIGHT VENTRICLE RV S prime:     9.36 cm/s TAPSE (M-mode): 2.0 cm LEFT ATRIUM           Index        RIGHT ATRIUM          Index LA diam:      2.70 cm 1.36 cm/m   RA Area:     6.72 cm LA Vol (A2C): 33.8 ml 16.98 ml/m  RA Volume:   9.86 ml  4.95 ml/m LA Vol (A4C): 21.7 ml 10.90 ml/m  AORTIC VALVE                    PULMONIC VALVE AV Area (Vmax):    2.32 cm     PV Vmax:       0.88 m/s AV Area (Vmean):   2.60 cm     PV Peak grad:  3.1 mmHg AV Area (VTI):     2.33 cm AV Vmax:           113.00 cm/s AV Vmean:          75.100 cm/s AV VTI:            0.205 m AV Peak Grad:      5.1 mmHg AV Mean Grad:      3.0 mmHg LVOT Vmax:         103.00 cm/s LVOT Vmean:        76.700 cm/s LVOT VTI:          0.188 m LVOT/AV VTI ratio: 0.92  AORTA Ao Root diam: 2.60 cm Ao Asc diam:  2.50 cm MITRAL VALVE                TRICUSPID VALVE MV Area (PHT): 5.54 cm     TV Peak grad:   13.4 mmHg MV Decel Time: 137 msec     TV Mean grad:   11.0 mmHg MV E velocity: 53.90 cm/s   TV Vmax:        1.83 m/s MV A velocity: 103.00  cm/s  TV Vmean:       159.0 cm/s MV E/A ratio:  0.52         TV VTI:         0.58 msec                              SHUNTS                             Systemic VTI:  0.19 m                             Systemic Diam: 1.80 cm Dani Gobble Croitoru MD Electronically signed by Sanda Klein MD Signature Date/Time: 11/22/2021/10:29:33 AM    Final    VAS US CAROTID  Result Date: 11/22/2021 Carotid Arterial Duplex Study Patient Name:  Jeffrey [name] [facility]  Date of Exam:   11/22/2021 Medical Rec #: [id number]         Accession #:  [id number] Date of Birth: Apr 16, 1941         Patient Gender: M Patient Age:   69 years Exam Location:  [facility] Procedure:      VAS US CAROTID Referring Phys: Orma Flaming --------------------------------------------------------------------------------  Indications:       History of carotid stenosis. Risk Factors:      Hypertension, hyperlipidemia, past history of smoking. Other Factors:     Dementia, multiple falls. Comparison Study:  09-08-2020 Most recent prior carotid duplex consistent with                    50-69% ICA stenosis bilaterally. Performing Technologist: Darlin Coco RDMS, RVT  Examination Guidelines: A complete evaluation includes B-mode imaging, spectral Doppler, color Doppler, and power Doppler as needed of all accessible portions of each vessel. Bilateral testing is considered an integral part of a complete examination. Limited examinations for reoccurring indications may be performed as noted.  Right Carotid Findings: +----------+--------+--------+--------+------------------+---------------------+             PSV cm/s EDV cm/s Stenosis Plaque Description Comments               +----------+--------+--------+--------+------------------+---------------------+  CCA Prox   64       21                                   intimal thickening     +----------+--------+--------+--------+------------------+---------------------+  CCA Distal 50       17                hyperechoic,                                                                      heterogenous and                                                                 irregular                                 +----------+--------+--------+--------+------------------+---------------------+  ICA Prox   289      66       60-79%                                             +----------+--------+--------+--------+------------------+---------------------+  ICA Mid    287      49                                   post-stenotic  turbulence             +----------+--------+--------+--------+------------------+---------------------+  ICA Distal 54       16                                   tortuous               +----------+--------+--------+--------+------------------+---------------------+  ECA        72       22                                                          +----------+--------+--------+--------+------------------+---------------------+ +----------+--------+-------+----------------+-------------------+             PSV cm/s EDV cms Describe         Arm Pressure (mmHG)  +----------+--------+-------+----------------+-------------------+  Subclavian 155              Multiphasic, WNL                      +----------+--------+-------+----------------+-------------------+ +---------+--------+--+--------+--+---------+  Vertebral PSV cm/s 46 EDV cm/s 11 Antegrade  +---------+--------+--+--------+--+---------+  Left Carotid Findings: +----------+--------+--------+--------+-------------------+------------------+             PSV cm/s EDV cm/s Stenosis Plaque Description  Comments            +----------+--------+--------+--------+-------------------+------------------+  CCA Prox   67       15                                                        +----------+--------+--------+--------+-------------------+------------------+  CCA Distal 72       23                                     intimal thickening  +----------+--------+--------+--------+-------------------+------------------+  ICA Prox   67       21       1-39%    heterogenous , mild                     +----------+--------+--------+--------+-------------------+------------------+  ICA Mid    54       19                                                        +----------+--------+--------+--------+-------------------+------------------+  ICA Distal 58       22                                                        +----------+--------+--------+--------+-------------------+------------------+  ECA        154      27       >50%                                             +----------+--------+--------+--------+-------------------+------------------+ +----------+--------+--------+----------------+-------------------+  PSV cm/s EDV cm/s Describe         Arm Pressure (mmHG)  +----------+--------+--------+----------------+-------------------+  Subclavian 118               Multiphasic, WNL                      +----------+--------+--------+----------------+-------------------+ +---------+--------+--+--------+--+---------+  Vertebral PSV cm/s 40 EDV cm/s 11 Antegrade  +---------+--------+--+--------+--+---------+   Summary: Right Carotid: Velocities in the right ICA are consistent with a 60-79%                stenosis. Left Carotid: Velocities in the left ICA are consistent with a 1-39% stenosis.               The ECA appears >50% stenosed. Vertebrals:  Bilateral vertebral arteries demonstrate antegrade flow. Subclavians: Normal flow hemodynamics were seen in bilateral subclavian              arteries. *See table(s) above for measurements and observations.     Preliminary     Assessment/Plan: Head CT shows what I think is probably a contusion within the left posterior temporal lobe.  There is no mass-effect.  There is some surrounding hypodensity.  There is a tiny bit of blood in the posterior lateral ventricles as is often seen in the  situations.  There is no hydrocephalus.  The small left-sided subdural hematoma is even smaller.  Mass-effect from that.  There is no indication for surgery.  This is likely a contusion that will resolve with time.  His wife has been reassured.  They would not want aggressive management even if he had something surgical, but this is not surgical.  Recommend PT OT and mobilization.  Estimated body mass index is 23.11 kg/m as calculated from the following:   Height as of this encounter: 6' (1.829 m).   Weight as of this encounter: 77.3 kg.    LOS: 0 days    Eustace Moore 11/22/2021, 6:59 PM

## 2021-11-22 NOTE — Progress Notes (Signed)
Pt wife asked that we remove sitter from room.  Stated she will stay overnight and daughter will be in to relieve her in the morning.  She was concerned about the charge for a sitter.  I discussed importance of pt remaining safe.  She continues to want to family to sit.

## 2021-11-22 NOTE — Telephone Encounter (Signed)
Assessment/Plan: 11-21-2021 81 year old male presented to the ED after sustaining a fall on concrete- unwitnessed- at [facility] where he and his wife reside.  CT head shows a small acute bilateral SDH with no mass effect, small volume hemorrhage in the 4th ventricle. No surgical intervention warranted at this time. Would recommend medicine admission to figure out the source of his frequent falls. Neuro checks Q4 hours with close observation.   Will follow up with established Neurologist/ NP in the office.   Please use alternative MOCA test for cognitive testing.

## 2021-11-22 NOTE — TOC CAGE-AID Note (Signed)
Transition of Care [facility]) - CAGE-AID Screening   Patient Details  Name: Jeffrey [name] MRN: [id number] Date of Birth: 1941/03/06  Clinical Narrative:  Patient with baseline dementia, subdural on admission. Unable to participate in screening at this time.  CAGE-AID Screening: Substance Abuse Screening unable to be completed due to: : Patient unable to participate

## 2021-11-22 NOTE — Evaluation (Signed)
Physical Therapy Evaluation Patient Details Name: Jeffrey [name] MRN: [id number] DOB: [birth year] Today's Date: 11/22/2021  History of Present Illness  Pt is an 81 y/o male admitted following fall. Imaging showed bilateral SDH and hemorrhage in 4th ventricle. Follow up imaging on 1/10 showed posterior left temporal lobe SDH. PMH includes dementia, HTN, OSA on CPAP, CKD, and CAD.  Clinical Impression  Pt admitted secondary to problem above with deficits below. Pt with difficulty sequencing throughout mobility tasks. Requiring mod A for bed mobility and min A +2 to stand and take side steps at EOB. MD requesting to limit mobility, so further mobility deferred. Pt currently lives in [place] with his wife, but has had multiple falls. Recommending SNF level therapies to increase independence and safety. However, if family decides to take pt home will benefit from max [facility] services. Will continue to follow acutely.        Recommendations for follow up therapy are one component of a multi-disciplinary discharge planning process, led by the attending physician.  Recommendations may be updated based on patient status, additional functional criteria and insurance authorization.  Follow Up Recommendations Skilled nursing-short term rehab (<3 hours/day)    Assistance Recommended at Discharge Frequent or constant Supervision/Assistance  Patient can return home with the following  Direct supervision/assist for financial management;Direct supervision/assist for medications management;Help with stairs or ramp for entrance;Assist for transportation;Assistance with cooking/housework;A lot of help with walking and/or transfers;A lot of help with bathing/dressing/bathroom    Equipment Recommendations None recommended by PT  Recommendations for Other Services       Functional Status Assessment Patient has had a recent decline in their functional status and demonstrates the ability to make significant improvements in  function in a reasonable and predictable amount of time.     Precautions / Restrictions Precautions Precautions: Fall Restrictions Weight Bearing Restrictions: No      Mobility  Bed Mobility Overal bed mobility: Needs Assistance Bed Mobility: Supine to Sit;Sit to Supine     Supine to sit: Mod assist;+2 for safety/equipment;+2 for physical assistance Sit to supine: Mod assist;+2 for safety/equipment;+2 for physical assistance   General bed mobility comments: Required mod A for sequencing to come to/from EOB. Min A for physical assist    Transfers Overall transfer level: Needs assistance Equipment used: 2 person hand held assist Transfers: Sit to/from Stand Sit to Stand: Min assist;+2 physical assistance           General transfer comment: Min A +2 for lift assist and steadying. Pt with difficulty sequencing to take steps and required multimodal cues. MD requesting to limit mobility so further mobility deferred.    Ambulation/Gait                  Stairs            Wheelchair Mobility    Modified Rankin (Stroke Patients Only)       Balance Overall balance assessment: Needs assistance Sitting-balance support: No upper extremity supported;Feet supported Sitting balance-Leahy Scale: Fair     Standing balance support: Bilateral upper extremity supported Standing balance-Leahy Scale: Poor Standing balance comment: REliant on BUE and external support                             Pertinent Vitals/Pain Pain Assessment: Faces Faces Pain Scale: No hurt    Home Living Family/patient expects to be discharged to:: Private residence Living Arrangements: Spouse/significant other Available Help at Discharge:  Family;Available 24 hours/day;Personal care attendant (Aide comes 2 days/week for 4 hours) Type of Home: Independent living facility Home Access: Level entry       Home Layout: One level Home Equipment: Grab bars - toilet;Grab bars -  tub/shower;Shower Land (2 wheels)      Prior Function Prior Level of Function : Needs assist             Mobility Comments: Reports independence with ambulation using RW, however, has had numerous falls ADLs Comments: Wife reports pt requires supervision for bathing and is requiring more assist for dressing     Hand Dominance        Extremity/Trunk Assessment   Upper Extremity Assessment Upper Extremity Assessment: Defer to OT evaluation    Lower Extremity Assessment Lower Extremity Assessment: Generalized weakness    Cervical / Trunk Assessment Cervical / Trunk Assessment: Kyphotic  Communication   Communication: HOH  Cognition Arousal/Alertness: Awake/alert Behavior During Therapy: Restless Overall Cognitive Status: History of cognitive impairments - at baseline                                 General Comments: Pt restless throughout. Dementia at baseline. Fixated on wanting mitts off        General Comments General comments (skin integrity, edema, etc.): Pt's wife present during session    Exercises     Assessment/Plan    PT Assessment Patient needs continued PT services  PT Problem List Decreased strength;Decreased activity tolerance;Decreased balance;Decreased mobility;Decreased knowledge of use of DME;Decreased knowledge of precautions;Decreased cognition;Decreased safety awareness       PT Treatment Interventions DME instruction;Gait training;Functional mobility training;Therapeutic activities;Therapeutic exercise;Balance training;Patient/family education;Cognitive remediation    PT Goals (Current goals can be found in the Care Plan section)  Acute Rehab PT Goals Patient Stated Goal: for pt to return home PT Goal Formulation: With patient/family Time For Goal Achievement: 12/06/21 Potential to Achieve Goals: Fair    Frequency Min 2X/week     Co-evaluation PT/OT/SLP Co-Evaluation/Treatment: Yes Reason for  Co-Treatment: For patient/therapist safety;Necessary to address cognition/behavior during functional activity;To address functional/ADL transfers PT goals addressed during session: Mobility/safety with mobility;Balance         AM-PAC PT "6 Clicks" Mobility  Outcome Measure Help needed turning from your back to your side while in a flat bed without using bedrails?: A Lot Help needed moving from lying on your back to sitting on the side of a flat bed without using bedrails?: A Lot Help needed moving to and from a bed to a chair (including a wheelchair)?: Total Help needed standing up from a chair using your arms (e.g., wheelchair or bedside chair)?: Total Help needed to walk in hospital room?: Total Help needed climbing 3-5 steps with a railing? : Total 6 Click Score: 8    End of Session Equipment Utilized During Treatment: Gait belt Activity Tolerance: Patient tolerated treatment well Patient left: in bed;with call bell/phone within reach;with family/visitor present (on bed in ED) Nurse Communication: Mobility status PT Visit Diagnosis: Unsteadiness on feet (R26.81);Muscle weakness (generalized) (M62.81);History of falling (Z91.81);Repeated falls (R29.6)    Time: 5726-2035 PT Time Calculation (min) (ACUTE ONLY): 17 min   Charges:   PT Evaluation $PT Eval Moderate Complexity: 1 Mod          Reuel Derby, PT, DPT  Acute Rehabilitation Services  Pager: [phone] Office: [phone]   Rudean Hitt 11/22/2021, 1:02 PM

## 2021-11-22 NOTE — Progress Notes (Signed)
°  X-cover Note: Called by bedside RN to review case. Was given sign out by admitting MD that pt may have urinary retention and need foley catheter placement.  On the phone with pt's wife who sounds like she is quite tired and agitated herself. RN reports wife has been in ER all day with patient. Pt with frequent falls and small subdural hematoma. Known LBBB on EKG. Cardiology has reviewed EKG and did not feel that pt was having STEMI/NSTEMI.  First set of troponin elevated to 243 and 2nd set 337.  Rn reports pt is agitated. Rn reports that 2 mg IV morphine did help settle the patient down earlier yesterday evening(9 pm). Pt noted to have posterior scalp laceration.  I spoke with pt's dtr Carola Rhine via phone.  Discussed that pt does have new subdural hematoma but does appear to be obstructive and does not cause midline shift.  Given the totality of pt's clinical findings, I think his agitation is a combination of his dementia  +/- sundowning, noisy/unfamiliar ER environment, new foley catheter for urinary retention and pain from scalp laceration from his head trauma.  Recommend that pt receive 2 mg IV morphine. Discussed that IV morphine would interfere with pt's neurological exam but at this point, his subdural hematomas are small and no evidence of obstruction.  Also given his rising troponin, pt most likely has some demand ischemia and trying to keep him calm would be the best management strategy since pt cannot receive IV heparin due to acute subdural hematoma.  Pt's dtr Carola Rhine agrees with management plan.  Kristopher Oppenheim, DO [facility]

## 2021-11-22 NOTE — Progress Notes (Signed)
Pt admitted to 3W7 at this time.  Confused.  Alert to name and birthdate only.  Staples noted to posterior skull from fall today.  Tele placed on patient, CCMD called and second verification completed.  Sitter at bedside. Bed alarm set, call bell within reach.

## 2021-11-22 NOTE — ED Notes (Signed)
This RN contacted provider about pt.'s neuro status and relayed wife's concerns

## 2021-11-22 NOTE — ED Notes (Addendum)
Mittens have been applied, pt is eating breakfast with wife, neuro has been updated about changes. Hospital bed has been ordered

## 2021-11-22 NOTE — TOC Initial Note (Signed)
Transition of Care [facility]) - Initial/Assessment Note    Patient Details  Name: Jeffrey [name] MRN: [id number] Date of Birth: [birth year]  [facility] CM/SW Contact:    Geralynn Ochs, LCSW Phone Number: 11/22/2021, 4:06 PM  Clinical Narrative:      CSW met with wife at bedside to discuss recommendation for SNF. Patient's wife in agreement, would prefer to return to [facility]. CSW to reach out to [facility] to confirm bed availability. CSW to follow.             Expected Discharge Plan: Skilled Nursing Facility Barriers to Discharge: Continued Medical Work up   Patient Goals and CMS Choice Patient states their goals for this hospitalization and ongoing recovery are:: patient unable to participate in goal setting, only oriented to self CMS Medicare.gov Compare Post Acute Care list provided to:: Patient Represenative (must comment) Choice offered to / list presented to : Spouse  Expected Discharge Plan and Services Expected Discharge Plan: [place] Choice: [place] Living arrangements for the past 2 months: [place]                                      Prior Living Arrangements/Services Living arrangements for the past 2 months: [place] Lives with:: Spouse Patient language and need for interpreter reviewed:: No Do you feel safe going back to the place where you live?: Yes      Need for Family Participation in Patient Care: Yes (Comment) Care giver support system in place?: Yes (comment)   Criminal Activity/Legal Involvement Pertinent to Current Situation/Hospitalization: No - Comment as needed  Activities of Daily Living      Permission Sought/Granted Permission sought to share information with : Facility Sport and exercise psychologist, Family Supports Permission granted to share information with : Yes, Verbal Permission Granted  Share Information with NAME:  Clarise Cruz  Permission granted to share info w AGENCY: [facility]  Permission granted to share info w Relationship: Spouse     Emotional Assessment Appearance:: Appears stated age Attitude/Demeanor/Rapport: Unable to Assess Affect (typically observed): Unable to Assess Orientation: : Oriented to Self Alcohol / Substance Use: Not Applicable Psych Involvement: No (comment)  Admission diagnosis:  Subdural hematoma [S06.5XAA] Fall [W19.XXXA] Patient Active Problem List   Diagnosis Date Noted   Subdural hematoma 11/22/2021   Bilateral subdural hematomas 11/21/2021   Dysphagia 11/21/2021   Ectropion of right lower eyelid 11/21/2021   CKD (chronic kidney disease), stage III ([place]) 11/21/2021   Alzheimer's dementia ([place]) 05/25/2021   Apraxia on examination 05/25/2021   Frequent falls 05/25/2021   PSVT (paroxysmal supraventricular tachycardia) ([place]) 12/22/2020   SAH (subarachnoid hemorrhage) ([place]) 09/30/2020   possible syncope and collapse 02/17/2020   Bradycardia on ECG 02/17/2020   Hypersomnia with sleep apnea 02/17/2020   Carotid artery disease ([place]) 11/28/2016   Hyperkalemia 10/26/2014   HYPERGLYCEMIA 11/10/2008   LOCALIZED SUPERFICIAL SWELLING MASS OR LUMP 03/31/2008   MELANOMA OF SKIN, SITE UNSPECIFIED 02/11/2008   Hyperlipidemia 02/11/2008   Essential hypertension 02/11/2008   HYPERPLASIA PROSTATE UNS W/O UR OBST & OTH LUTS 02/11/2008   SKIN CANCER, HX OF 02/11/2008   GEN OSTEOARTHROSIS INVOLVING MULTIPLE SITES 10/03/2007   PES PLANUS 10/03/2007   PCP:  Larey Seat, MD Pharmacy:   [facility] [id number] - [place] [place] [address] Phone: [phone] Fax: [phone]     Social Determinants of Health (SDOH) Interventions    Readmission Risk Interventions No flowsheet data found.

## 2021-11-22 NOTE — Progress Notes (Signed)
Carotid duplex bilateral study completed.   Please see CV Proc for preliminary results.   Zygmund Passero, RDMS, RVT  

## 2021-11-22 NOTE — ED Notes (Signed)
Paged MD Bridgett Larsson regarding pt's recent blood pressures. Awaiting response from MD.

## 2021-11-22 NOTE — Progress Notes (Signed)
° °  Echocardiogram 2D Echocardiogram has been performed.  Jeffrey [name] 11/22/2021, 9:10 AM

## 2021-11-22 NOTE — Progress Notes (Addendum)
PROGRESS NOTE    Jeffrey [name]  TLX:[id number] DOB: 05/02/1941 DOA: 11/21/2021 PCP: Larey Seat, MD   Brief Narrative: 81 year old with past medical history significant for dementia, frequent fall, hypertension, hyperlipidemia, OSA on CPAP, CKD stage III, history of skin cancer, SAH in 2021 due to fall, carotid artery disease who presents to the ED after a fall at his independent living the morning of admission. Patient was found by his wife down on the ground on the pavement.  She thinks he was on the ground for around 15 minutes.  She has a history of frequent fall with work-up of heart/neurology work-up unrevealing.  Frequent falls thought related to dementia..  CT head showed a small acute bilateral subdural hematoma without mass-effect.  Small volume acute hemorrhage in the fourth ventricle.  Small hyperdensity along the posterior lateral aspect of the third ventricle on the right side, weekly additional extra axial hemorrhage or the deep cerebral vein thrombosis is an alternative.     Assessment & Plan:   Active Problems:   Hyperlipidemia   Essential hypertension   possible syncope and collapse   Hypersomnia with sleep apnea   Alzheimer's dementia (HCC)   Frequent falls   Bilateral subdural hematomas   Hyperkalemia   Dysphagia   Ectropion of right lower eyelid   CKD (chronic kidney disease), stage III (HCC)   1-Bilateral Subdural Hematoma: Patient present after a fall found to have bilateral subdural hematoma Neurosurgery consulted and following. CT also showed a small hyperdensity along the posterior lateral aspect of the third ventricle on the right side, likely additional extra-axial hemorrhage although deep cerebral vein thrombosis is an alternative.  Consider MRI if indicated.  2-Lobulated intracerebral Hematoma in the posterior left temporal lobe: -Repeated CT head this am for worsening confusion showed: There is interval appearance of 4.7 x 2.9 x 2.8 cm  lobulated intra cerebral hematoma in the posterior left temporal lobe. There is interval increase in the amount of blood in the third and both lateral ventricles. There is increased amount of blood between the leaves of the tentorium, more so on the left side. Possible suggesting subarachnoid hemorrhage. 3 mm acute left subdural hematoma seen in the previous examination appears less prominent.  -Discussed CT head results with Dr Ronnald Ramp, he recommend support care, no midline shift, no surgery recommended. He will continue to follow up on patient.  -Continue neuro check.   3-Acute Encephalopathy: Multifactorial related to contusion, Delirium, change in environment.  -Discussed with Dr Ronnald Ramp, Madaline Brilliant to do low dose Seroquel PRN for agitation.  -Schedule tylenol for headaches.   4-Frequent fall/ ? Syncope:  PT/OT evaluation.  He has had prior cardiac work up. Monitor on telemetry.  Carotid doppler: Pending.  Orthostatic; ordered.   Type II MI, elevation troponin.  Evaluated by cardiology. Dr Einar Gip doesn't suspect ACS.   Hyperkalemia; resolved.   HTN; continue with ramipril.  PRN hydralazine.   HLD; off statins due to myalgia.   Sleep Apnea; continue with CPAP.   Chronic Dysphagia.   CKD stage II. Monitor.       Estimated body mass index is 23.12 kg/m as calculated from the following:   Height as of 05/25/21: 6' (1.829 m).   Weight as of 05/25/21: 77.3 kg.   DVT prophylaxis: SCD Code Status: Partial code Family Communication: wife and daughter over phone Disposition Plan:  Status is: Observation  The patient will require care spanning > 2 midnights and should be moved to inpatient because: sud dural hematoma,  new intracerebral hematoma       Consultants:  Neurosurgery  Cardiology   Procedures:  ECHO  Antimicrobials:    Subjective: He is alert, constantly trying to get out of, denies pain. Speech is clear.   Objective: Vitals:   11/22/21 0515 11/22/21 0545  11/22/21 0630 11/22/21 0645  BP: 104/74 110/72 106/69 107/67  Pulse: 69 79 68 72  Resp: 19 (!) 22 19 18   Temp:      TempSrc:      SpO2: 94% 93% 94% 93%    Intake/Output Summary (Last 24 hours) at 11/22/2021 0740 Last data filed at 11/22/2021 0322 Gross per 24 hour  Intake 750.54 ml  Output --  Net 750.54 ml   There were no vitals filed for this visit.  Examination:  General exam: agitated at times Respiratory system: Clear to auscultation. Respiratory effort normal. Cardiovascular system: S1 & S2 heard, RRR. No JVD, murmurs, rubs, gallops or clicks. No pedal edema. Gastrointestinal system: Abdomen is nondistended, soft and nontender. No organomegaly or masses felt. Normal bowel sounds heard. Central nervous system: alert, confuse, agitated, moves all 4 extremities.      Data Reviewed: I have personally reviewed following labs and imaging studies  CBC: Recent Labs  Lab 11/21/21 1050  WBC 11.1*  NEUTROABS 9.1*  HGB 17.7*  HCT 54.0*  MCV 93.3  PLT 932   Basic Metabolic Panel: Recent Labs  Lab 11/21/21 1050 11/21/21 2051  NA 138 134*  K 5.2* 3.8  CL 104 100  CO2 24 22  GLUCOSE 126* 168*  BUN 20 14  CREATININE 1.10 1.11  CALCIUM 9.4 9.3   GFR: CrCl cannot be calculated (Unknown ideal weight.). Liver Function Tests: No results for input(s): AST, ALT, ALKPHOS, BILITOT, PROT, ALBUMIN in the last 168 hours. No results for input(s): LIPASE, AMYLASE in the last 168 hours. No results for input(s): AMMONIA in the last 168 hours. Coagulation Profile: No results for input(s): INR, PROTIME in the last 168 hours. Cardiac Enzymes: Recent Labs  Lab 11/21/21 2051  CKTOTAL 318   BNP (last 3 results) No results for input(s): PROBNP in the last 8760 hours. HbA1C: No results for input(s): HGBA1C in the last 72 hours. CBG: Recent Labs  Lab 11/21/21 1037  GLUCAP 124*   Lipid Profile: No results for input(s): CHOL, HDL, LDLCALC, TRIG, CHOLHDL, LDLDIRECT in the last  72 hours. Thyroid Function Tests: Recent Labs    11/21/21 2051  TSH 0.476   Anemia Panel: No results for input(s): VITAMINB12, FOLATE, FERRITIN, TIBC, IRON, RETICCTPCT in the last 72 hours. Sepsis Labs: No results for input(s): PROCALCITON, LATICACIDVEN in the last 168 hours.  No results found for this or any previous visit (from the past 240 hour(s)).       Radiology Studies: DG Wrist 2 Views Right  Result Date: 11/21/2021 CLINICAL DATA:  Fall, dementia EXAM: RIGHT WRIST - 2 VIEW COMPARISON:  None. FINDINGS: There is no evidence of fracture or dislocation. There is no evidence of arthropathy or other focal bone abnormality. Soft tissues are unremarkable. IMPRESSION: Negative. Electronically Signed   By: Keane Police D.O.   On: 11/21/2021 16:19   DG Wrist Complete Right  Result Date: 11/21/2021 CLINICAL DATA:  Trauma, pain EXAM: RIGHT WRIST - COMPLETE 3+ VIEW COMPARISON:  None. FINDINGS: No recent fracture or dislocation is seen. Degenerative changes are noted with bony spurs in first carpometacarpal joint and the interphalangeal joint of thumb. Minimal bony spurs seen in the lateral radiocarpal joint.  IMPRESSION: No recent fracture or dislocation is seen in the right wrist. Electronically Signed   By: Elmer Picker M.D.   On: 11/21/2021 11:29   CT Head Wo Contrast  Addendum Date: 11/21/2021   ADDENDUM REPORT: 11/21/2021 11:59 ADDENDUM: Posterior scalp laceration. Electronically Signed   By: Logan Bores M.D.   On: 11/21/2021 11:59   Result Date: 11/21/2021 CLINICAL DATA:  Head trauma.  Fall today.  Head and neck pain. EXAM: CT HEAD WITHOUT CONTRAST CT CERVICAL SPINE WITHOUT CONTRAST TECHNIQUE: Multidetector CT imaging of the head and cervical spine was performed following the standard protocol without intravenous contrast. Multiplanar CT image reconstructions of the cervical spine were also generated. COMPARISON:  CT head and cervical spine 03/05/2021 FINDINGS: CT HEAD FINDINGS  Brain: A new small hyperdense subdural hematoma over the left lateral cerebral convexity measures up to 3 mm in thickness without mass effect. Trace subdural hemorrhage is also present along the posterior aspect of the falx and along the left greater than right tentorial leaflets, and there is also new trace hypoattenuating subdural fluid or blood over the right cerebral convexity. There is minimal acute hemorrhage in the fourth ventricle. There is also a small extra-axial focus of curvilinear hyperdensity along the posterolateral aspect of third ventricle coursing towards the pineal region (series 4, image 16). No acute cortically based infarct or midline shift is evident. There is mild cerebral atrophy. Patchy hypodensities in the cerebral white matter bilaterally are unchanged and nonspecific but compatible with moderate chronic small vessel ischemic disease. Vascular: Calcified atherosclerosis at the skull base. Skull: No acute fracture or suspicious osseous lesion. Sinuses/Orbits: Clear paranasal sinuses. Unchanged large right mastoid effusion. Right eyelid weight. Bilateral cataract extraction. Other: Postoperative changes involving the right parotid gland. CT CERVICAL SPINE FINDINGS Alignment: Chronic trace anterolisthesis of C7 on T1. Skull base and vertebrae: No acute fracture or suspicious osseous lesion. Soft tissues and spinal canal: No prevertebral fluid or swelling. No visible canal hematoma. Disc levels: Multilevel disc degeneration, moderate at C6-7. Asymmetrically advanced right-sided cervical facet arthrosis at multiple levels. Right facet ankylosis at C2-3. Moderate multilevel neural foraminal stenosis and at least mild multilevel spinal stenosis. Upper chest: Clear lung apices. Other: Postoperative changes in the right upper neck. IMPRESSION: 1. Small acute bilateral subdural hematomas without mass effect. 2. Small volume acute hemorrhage in the fourth ventricle. 3. Small hyperdensity along the  posterolateral aspect of the third ventricle on the right, likely additional extra-axial hemorrhage although deep cerebral vein thrombosis is an alternative consideration. 4. Moderate chronic small vessel ischemic disease. 5. No acute cervical spine fracture. Critical Value/emergent results were called by telephone at the time of interpretation on 11/21/2021 at 11:47 am to provider Myna Bright, who verbally acknowledged these results. Electronically Signed: By: Logan Bores M.D. On: 11/21/2021 11:54   CT Cervical Spine Wo Contrast  Addendum Date: 11/21/2021   ADDENDUM REPORT: 11/21/2021 11:59 ADDENDUM: Posterior scalp laceration. Electronically Signed   By: Logan Bores M.D.   On: 11/21/2021 11:59   Result Date: 11/21/2021 CLINICAL DATA:  Head trauma.  Fall today.  Head and neck pain. EXAM: CT HEAD WITHOUT CONTRAST CT CERVICAL SPINE WITHOUT CONTRAST TECHNIQUE: Multidetector CT imaging of the head and cervical spine was performed following the standard protocol without intravenous contrast. Multiplanar CT image reconstructions of the cervical spine were also generated. COMPARISON:  CT head and cervical spine 03/05/2021 FINDINGS: CT HEAD FINDINGS Brain: A new small hyperdense subdural hematoma over the left lateral cerebral convexity measures  up to 3 mm in thickness without mass effect. Trace subdural hemorrhage is also present along the posterior aspect of the falx and along the left greater than right tentorial leaflets, and there is also new trace hypoattenuating subdural fluid or blood over the right cerebral convexity. There is minimal acute hemorrhage in the fourth ventricle. There is also a small extra-axial focus of curvilinear hyperdensity along the posterolateral aspect of third ventricle coursing towards the pineal region (series 4, image 16). No acute cortically based infarct or midline shift is evident. There is mild cerebral atrophy. Patchy hypodensities in the cerebral white matter bilaterally are  unchanged and nonspecific but compatible with moderate chronic small vessel ischemic disease. Vascular: Calcified atherosclerosis at the skull base. Skull: No acute fracture or suspicious osseous lesion. Sinuses/Orbits: Clear paranasal sinuses. Unchanged large right mastoid effusion. Right eyelid weight. Bilateral cataract extraction. Other: Postoperative changes involving the right parotid gland. CT CERVICAL SPINE FINDINGS Alignment: Chronic trace anterolisthesis of C7 on T1. Skull base and vertebrae: No acute fracture or suspicious osseous lesion. Soft tissues and spinal canal: No prevertebral fluid or swelling. No visible canal hematoma. Disc levels: Multilevel disc degeneration, moderate at C6-7. Asymmetrically advanced right-sided cervical facet arthrosis at multiple levels. Right facet ankylosis at C2-3. Moderate multilevel neural foraminal stenosis and at least mild multilevel spinal stenosis. Upper chest: Clear lung apices. Other: Postoperative changes in the right upper neck. IMPRESSION: 1. Small acute bilateral subdural hematomas without mass effect. 2. Small volume acute hemorrhage in the fourth ventricle. 3. Small hyperdensity along the posterolateral aspect of the third ventricle on the right, likely additional extra-axial hemorrhage although deep cerebral vein thrombosis is an alternative consideration. 4. Moderate chronic small vessel ischemic disease. 5. No acute cervical spine fracture. Critical Value/emergent results were called by telephone at the time of interpretation on 11/21/2021 at 11:47 am to provider Myna Bright, who verbally acknowledged these results. Electronically Signed: By: Logan Bores M.D. On: 11/21/2021 11:54        Scheduled Meds:  citalopram  10 mg Oral Daily   ofloxacin  1 drop Right Eye BID   QUEtiapine  50 mg Oral Once   ramipril  2.5 mg Oral Daily   rivastigmine  4.6 mg Transdermal Daily   sodium chloride flush  3 mL Intravenous Q12H   Continuous Infusions:   sodium chloride       LOS: 0 days    Time spent: 35 minutes.     Elmarie Shiley, MD [facility]   If 7PM-7AM, please contact night-coverage [email or web address]  11/22/2021, 7:40 AM

## 2021-11-22 NOTE — Progress Notes (Signed)
OT Cancellation Note  Patient Details Name: Jeffrey [name] MRN: [id number] DOB: February 25, 1941   Cancelled Treatment:    Reason Eval/Treat Not Completed: Patient at procedure or test/ unavailable (Pt headed to CT upon arrival, OT evaluation to f/u as appropriate.)  [name] [name] 11/22/2021, 10:58 AM

## 2021-11-22 NOTE — Evaluation (Signed)
Occupational Therapy Evaluation Patient Details Name: Jeffrey [name] MRN: [id number] DOB: 1941-04-28 Today's Date: 11/22/2021   History of Present Illness Pt is an 81 y/o male admitted following fall. Imaging showed bilateral SDH and hemorrhage in 4th ventricle. Follow up imaging on 1/10 showed posterior left temporal lobe SDH. PMH includes dementia, HTN, OSA on CPAP, CKD, and CAD.   Clinical Impression   Benjermin required  cognitive and physical assist for ADLs PTA, he has a PCA 2 days/week fro 4 hours at a time. Pt ambulated with RW, but reports several falls recently. The couple lives at an [facility] with level entry. Upon evaluation pt required mod a +2 for bed mobility, min A +2 for OOB transfers and up to mod A for ADLs. He is limited by impaired cognition and generalized weakness. Pt will benefit from OT acutely. Recommend d/c to SNF as pt has had several falls at his current ILF.      Recommendations for follow up therapy are one component of a multi-disciplinary discharge planning process, led by the attending physician.  Recommendations may be updated based on patient status, additional functional criteria and insurance authorization.   Follow Up Recommendations  Skilled nursing-short term rehab (<3 hours/day)    Assistance Recommended at Discharge Frequent or constant Supervision/Assistance  Patient can return home with the following A little help with walking and/or transfers;Assistance with cooking/housework;Direct supervision/assist for financial management;Direct supervision/assist for medications management;Assist for transportation;Help with stairs or ramp for entrance    Functional Status Assessment  Patient has had a recent decline in their functional status and demonstrates the ability to make significant improvements in function in a reasonable and predictable amount of time.  Equipment Recommendations  None recommended by OT       Precautions / Restrictions  Precautions Precautions: Fall Restrictions Weight Bearing Restrictions: No      Mobility Bed Mobility Overal bed mobility: Needs Assistance Bed Mobility: Supine to Sit;Sit to Supine     Supine to sit: Mod assist;+2 for safety/equipment;+2 for physical assistance Sit to supine: Mod assist;+2 for safety/equipment;+2 for physical assistance   General bed mobility comments: Required mod A for sequencing to come to/from EOB. Min A for physical assist    Transfers Overall transfer level: Needs assistance Equipment used: 2 person hand held assist Transfers: Sit to/from Stand Sit to Stand: Min assist;+2 physical assistance           General transfer comment: Min A +2 for lift assist and steadying. Pt with difficulty sequencing to take steps and required multimodal cues. MD requesting to limit mobility so further mobility deferred.      Balance Overall balance assessment: Needs assistance Sitting-balance support: No upper extremity supported;Feet supported Sitting balance-Leahy Scale: Fair     Standing balance support: Bilateral upper extremity supported Standing balance-Leahy Scale: Poor Standing balance comment: REliant on BUE and external support         ADL either performed or assessed with clinical judgement   ADL Overall ADL's : Needs assistance/impaired Eating/Feeding: Set up;Minimal assistance;Sitting   Grooming: Minimal assistance;Sitting   Upper Body Bathing: Minimal assistance;Sitting   Lower Body Bathing: Moderate assistance;Sit to/from stand   Upper Body Dressing : Minimal assistance;Sitting   Lower Body Dressing: Moderate assistance;Sit to/from stand   Toilet Transfer: Minimal assistance;Ambulation;Rolling walker (2 wheels)   Toileting- Clothing Manipulation and Hygiene: Minimal assistance;Sit to/from stand       Functional mobility during ADLs: Minimal assistance;Rolling walker (2 wheels) General ADL Comments: assist for verbal  cues and phyiscal  assist to initiate task     Vision Baseline Vision/History: 0 No visual deficits Ability to See in Adequate Light: 0 Adequate Vision Assessment?: No apparent visual deficits     Perception     Praxis      Pertinent Vitals/Pain Pain Assessment: Faces Faces Pain Scale: No hurt Pain Intervention(s): Monitored during session        Extremity/Trunk Assessment Upper Extremity Assessment Upper Extremity Assessment: Generalized weakness   Lower Extremity Assessment Lower Extremity Assessment: Defer to PT evaluation   Cervical / Trunk Assessment Cervical / Trunk Assessment: Kyphotic   Communication Communication Communication: HOH   Cognition Arousal/Alertness: Awake/alert Behavior During Therapy: Restless Overall Cognitive Status: History of cognitive impairments - at baseline         General Comments: Pt restless throughout. Dementia at baseline. Fixated on wanting mitts off     General Comments  wife present and supportive            Home Living Family/patient expects to be discharged to:: Private residence Living Arrangements: Spouse/significant other Available Help at Discharge: Family;Available 24 hours/day;Personal care attendant Type of Home: Independent living facility Home Access: Level entry     Home Layout: One level     Bathroom Shower/Tub: Occupational psychologist: Handicapped height     Home Equipment: Grab bars - toilet;Grab bars - tub/shower;Shower Land (2 wheels)          Prior Functioning/Environment Prior Level of Function : Needs assist             Mobility Comments: Reports independence with ambulation using RW, however, has had numerous falls ADLs Comments: Wife reports pt requires supervision for bathing and is requiring more assist for dressing        OT Problem List: Decreased strength;Decreased range of motion;Decreased activity tolerance;Impaired balance (sitting and/or standing);Decreased  safety awareness;Decreased knowledge of use of DME or AE;Decreased knowledge of precautions;Decreased cognition      OT Treatment/Interventions: Self-care/ADL training;Therapeutic exercise;Therapeutic activities;Patient/family education;Balance training    OT Goals(Current goals can be found in the care plan section) Acute Rehab OT Goals Patient Stated Goal: to take the mitts off OT Goal Formulation: With patient Time For Goal Achievement: 12/06/21 Potential to Achieve Goals: Fair ADL Goals Pt Will Perform Grooming: with modified independence;standing Pt Will Perform Lower Body Dressing: with modified independence;sit to/from stand Pt Will Transfer to Toilet: with modified independence;ambulating Additional ADL Goal #1: pt will sequence through ADL task with min verbal cues  OT Frequency: Min 2X/week    Co-evaluation PT/OT/SLP Co-Evaluation/Treatment: Yes Reason for Co-Treatment: For patient/therapist safety PT goals addressed during session: Mobility/safety with mobility;Balance OT goals addressed during session: ADL's and self-care      AM-PAC OT "6 Clicks" Daily Activity     Outcome Measure Help from another person eating meals?: A Little Help from another person taking care of personal grooming?: A Little Help from another person toileting, which includes using toliet, bedpan, or urinal?: A Little Help from another person bathing (including washing, rinsing, drying)?: A Lot Help from another person to put on and taking off regular upper body clothing?: A Little Help from another person to put on and taking off regular lower body clothing?: A Lot 6 Click Score: 16   End of Session Equipment Utilized During Treatment: Gait belt Nurse Communication: Mobility status  Activity Tolerance: Patient tolerated treatment well Patient left: in bed;with call bell/phone within reach;with bed alarm set;with family/visitor present  OT Visit Diagnosis: Unsteadiness on feet (R26.81);Other  abnormalities of gait and mobility (R26.89);Repeated falls (R29.6);Muscle weakness (generalized) (M62.81);History of falling (Z91.81)                Time: 4320-0379 OT Time Calculation (min): 18 min Charges:  OT General Charges $OT Visit: 1 Visit OT Evaluation $OT Eval Moderate Complexity: 1 Mod   Emmalia Heyboer A Marycruz Boehner 11/22/2021, 1:09 PM

## 2021-11-22 NOTE — Progress Notes (Signed)
Pt has home CPAP.  

## 2021-11-22 NOTE — ED Notes (Signed)
Called Pharmacy to request pt's Seroquel to be sent. Per pharmacy they had tubed earlier but will sen down again. Pt is still restless and agitated.

## 2021-11-22 NOTE — Consult Note (Signed)
CARDIOLOGY CONSULT NOTE  Patient ID: [name] MRN: [id number] DOB/AGE: [birth year] 81 y.o.  Admit date: 11/21/2021 Referring Physician  Kristopher Oppenheim, DO Primary Physician:  Larey Seat, MD Reason for Consultation  Abnormal EKG  Patient ID: Jeffrey [name], male    DOB: May 28, 1941, 81 y.o.   MRN: [id number]  Chief Complaint  Patient presents with   Fall   HPI:    Jeffrey [name]  is a 81 y.o. Caucasian male patient with hypertension, stage III chronic kidney disease, hyperlipidemia, frequent falls, dementia, severe obstructive sleep apnea admitted to the hospital on 11/21/2021, patient lives with his wife in independent living, had gone for his usual walk where he gets coffee and picks up newspaper and alerted to the neighbors, when patient's wife returned home after being outside, found him on the ground unconscious, patient was then brought to the hospital via EMS.  Patient was then found to have small acute bilateral subdural hematomas without mass-effect and small volume acute hemorrhage in the fourth ventricle and third ventricle and moderate chronic small vessel disease.  He was also found to have abnormal cardiac markers and abnormal EKG.  We were consulted to evaluate his presentation and further guide regarding management of cardiac issues.  Is presently laying down comfortably in bed.  He denies any chest pain or shortness of breath.  States that he would like to return home as soon as possible.  Past Medical History:  Diagnosis Date   Colon polyps    Diverticulosis    Hypercholesterolemia    Hypertension    Internal hemorrhoids    Past Surgical History:  Procedure Laterality Date   EYE SURGERY     X 4   HERNIA REPAIR  2006   abdominal   SKIN CANCER EXCISION     shoulder, basal cell   SQUAMOUS CELL CARCINOMA EXCISION     preauricular on the right   TONSILLECTOMY  1947   VASECTOMY  1981   Social History   Tobacco Use   Smoking status: Former     Packs/day: 0.25    Years: 3.00    Pack years: 0.75    Types: Cigarettes    Quit date: 08/21/1966    Years since quitting: 55.2   Smokeless tobacco: Never  Substance Use Topics   Alcohol use: Yes    Comment: limited    Family History  Problem Relation Age of Onset   Heart disease Father    Heart attack Father    Alzheimer's disease Mother    Heart failure Brother    Colon cancer Neg Hx    Stomach cancer Neg Hx    Esophageal cancer Neg Hx    Rectal cancer Neg Hx    Liver cancer Neg Hx     Marital Status: Married  ROS  Review of Systems  HENT:  Negative for ear pain.   Eyes:  Negative for blurred vision.  Cardiovascular: Negative.   Gastrointestinal: Negative.   Genitourinary: Negative.   Neurological:  Positive for focal weakness and headaches.  Psychiatric/Behavioral:  The patient is nervous/anxious.   All other systems reviewed and are negative. Objective   Vitals with BMI 11/22/2021 11/22/2021 11/22/2021  Height - 6\' 0"  -  Weight - 170 lbs 7 oz -  BMI - 17.49 -  Systolic 449 - 675  Diastolic 80 - 74  Pulse - - 67    Blood pressure 123/80, pulse 67, temperature 98.6 F (37 C), temperature source Oral, resp. rate  17, height 6' (1.829 m), weight 77.3 kg, SpO2 95 %.    Physical Exam Constitutional:      General: He is not in acute distress.    Appearance: Normal appearance.  HENT:     Mouth/Throat:     Mouth: Mucous membranes are moist.  Eyes:     Extraocular Movements: Extraocular movements intact.  Neck:     Vascular: No carotid bruit or JVD.  Cardiovascular:     Rate and Rhythm: Normal rate and regular rhythm.     Pulses: Normal pulses and intact distal pulses.  Pulmonary:     Effort: Pulmonary effort is normal.  Abdominal:     General: Bowel sounds are normal.     Palpations: Abdomen is soft.  Musculoskeletal:        General: No deformity.     Right lower leg: No edema.     Left lower leg: No edema.  Skin:    General: Skin is warm.     Capillary  Refill: Capillary refill takes less than 2 seconds.  Neurological:     General: No focal deficit present.     Mental Status: He is alert and oriented to person, place, and time.  Psychiatric:        Mood and Affect: Mood normal.   Laboratory examination:   Recent Labs    03/05/21 1008 11/21/21 1050 11/21/21 2051  NA 136 138 134*  K 3.9 5.2* 3.8  CL 103 104 100  CO2 26 24 22   GLUCOSE 129* 126* 168*  BUN 22 20 14   CREATININE 1.03 1.10 1.11  CALCIUM 8.8* 9.4 9.3  GFRNONAA >60 >60 >60   estimated creatinine clearance is 58 mL/min (by C-G formula based on SCr of 1.11 mg/dL).  CMP Latest Ref Rng & Units 11/21/2021 11/21/2021 03/05/2021  Glucose 70 - 99 mg/dL 168(H) 126(H) 129(H)  BUN 8 - 23 mg/dL 14 20 22   Creatinine 0.61 - 1.24 mg/dL 1.11 1.10 1.03  Sodium 135 - 145 mmol/L 134(L) 138 136  Potassium 3.5 - 5.1 mmol/L 3.8 5.2(H) 3.9  Chloride 98 - 111 mmol/L 100 104 103  CO2 22 - 32 mmol/L 22 24 26   Calcium 8.9 - 10.3 mg/dL 9.3 9.4 8.8(L)  Total Protein 6.5 - 8.1 g/dL - - 6.3(L)  Total Bilirubin 0.3 - 1.2 mg/dL - - 1.1  Alkaline Phos 38 - 126 U/L - - 50  AST 15 - 41 U/L - - 16  ALT 0 - 44 U/L - - 16   CBC Latest Ref Rng & Units 11/21/2021 03/05/2021  WBC 4.0 - 10.5 K/uL 11.1(H) 8.5  Hemoglobin 13.0 - 17.0 g/dL 17.7(H) 15.8  Hematocrit 39.0 - 52.0 % 54.0(H) 47.5  Platelets 150 - 400 K/uL 198 152   Lipid Panel No results for input(s): CHOL, TRIG, LDLCALC, VLDL, HDL, CHOLHDL, LDLDIRECT in the last 8760 hours.  HEMOGLOBIN A1C Lab Results  Component Value Date   HGBA1C 5.2 11/10/2008   TSH Recent Labs    11/21/21 2051  TSH 0.476   BNP (last 3 results) No results for input(s): BNP in the last 8760 hours.  Cardiac Panel (last 3 results) Recent Labs    11/21/21 2051 11/21/21 2234  CKTOTAL 318  --   TROPONINIHS 243* 337*     Medications and allergies   Allergies  Allergen Reactions   Donepezil Other (See Comments)    Per wife "has not done well."   Other Other (See  Comments)  Vicryl sutures, pte. Doesn't know what reaction he had, "Drs. Owens Shark + Yeatts deetrmined not to use it on me"     Current Meds  Medication Sig   citalopram (CELEXA) 10 MG tablet Take 1 tablet (10 mg total) by mouth daily.   ketoconazole (NIZORAL) 2 % shampoo Apply 1 application topically 3 (three) times a week.   niacinamide 500 MG tablet Take 500 mg by mouth 2 (two) times daily with a meal.   ofloxacin (OCUFLOX) 0.3 % ophthalmic solution Place 1 drop into the right eye 2 (two) times daily.   ramipril (ALTACE) 2.5 MG capsule Take 2.5 mg by mouth daily.   rivastigmine (EXELON) 4.6 mg/24hr Place 1 patch (4.6 mg total) onto the skin daily.   triamcinolone cream (KENALOG) 0.1 % Apply 1 application topically daily.   valACYclovir (VALTREX) 500 MG tablet Take 500 mg by mouth 2 (two) times daily as needed. Flare up    Scheduled Meds:  citalopram  10 mg Oral Daily   LORazepam  0.5 mg Intravenous Once   ofloxacin  1 drop Right Eye BID   ramipril  2.5 mg Oral Daily   rivastigmine  4.6 mg Transdermal Daily   sodium chloride flush  3 mL Intravenous Q12H   Continuous Infusions:  sodium chloride     sodium chloride 75 mL/hr at 11/22/21 0904   PRN Meds:.sodium chloride, acetaminophen **OR** acetaminophen, hydrALAZINE, HYDROcodone-acetaminophen, QUEtiapine, sodium chloride flush   I/O last 3 completed shifts: In: 750.5 [IV Piggyback:750.5] Out: -  No intake/output data recorded.    Radiology:   CT of the head without contrast 11/21/2021: 1. Small acute bilateral subdural hematomas without mass effect. 2. Small volume acute hemorrhage in the fourth ventricle. 3. Small hyperdensity along the posterolateral aspect of the third ventricle on the right, likely additional extra-axial hemorrhage although deep cerebral vein thrombosis is an alternative consideration. 4. Moderate chronic small vessel ischemic disease. 5. No acute cervical spine fracture.  Cardiac Studies:   Lexiscan  Myoview Stress Test 08/25/2019: 1. Resting EKG normal sinus rhythm, left axis deviation, poor R progression, IVCD.  Stress EKG nondiagnostic due to Lexiscan infusion. Hypertensive both in rest and stress with resting  BP 160/100 mm Hg.  2. Perfusion imaging study reveals the left ventricle to be normal in both rest and stress images.  There is a moderate sized mild ischemia noted in inferior and infero-apical wall extending from the base towards the apex.  Left ventricular systolic function was preserved at 53% without wall motion abnormality. This represents a low risk study in view of very minimal ischemia and normal wall motion. No previous exam available for comparison.   Echocardiogram 11/22/2020: :   1. Left ventricular ejection fraction, by estimation, is 55 to 60%. The left ventricle has normal function. Left ventricular endocardial border not optimally defined to evaluate regional wall motion. There may be hypokinesis of the apical segments of  the inferior wall and inferior septum. Left ventricular diastolic parameters are consistent with Grade I diastolic dysfunction (impaired relaxation).  2. Right ventricular systolic function is normal. The right ventricular size is normal.  3. The mitral valve is normal in structure. No evidence of mitral valve regurgitation. No evidence of mitral stenosis.  4. The aortic valve is normal in structure. Aortic valve regurgitation is not visualized. No aortic stenosis is present.  5. The inferior vena cava is normal in size with greater than 50% respiratory variability, suggesting right atrial pressure of 3 mmHg.  EKG:  EKG  11/21/2021: Sinus tachycardia at rate of 126 bpm, leftward axis, incomplete left bundle branch block.  Compared to EKG 09/17/2020, no significant change.  Assessment   1.  Type II MI 2.  Abnormal EKG, baseline incomplete left bundle branch block, do not suspect STEMI or ACS. 3.  Severe dementia and frequent falls, now admitted with  subdural and intracerebral hemorrhage 4.  Severe obstructive sleep apnea on CPAP  Recommendations:   Patient is alert, oriented and was able to keep on conversation.  He denied any chest pain or shortness of breath.  I do not think he has ACS.  Discussed with patient's daughter, in view of severe dementia, patient also has agitation cooped up in a small room in the emergency room with alarms going off and with a noise, he gets more agitated.  I know the patient's family well, if repeat CT scan of the head is stable, if neurology feels is stable, he can be discharged home and he has good help at home and also nursing home staff.  Patient was able to move all 4 extremities on command, no obvious neurologic deficits were noted by me.  Today's echocardiogram reviewed, do not think wall motion abnormalities reveal in view of poor visualization and also EKG changes were in the anterior wall.  Hence do not suspect ACS in the RCA distribution.  With regard to his fall, patient has had fairly frequent falls and has been using a walker.  I do not suspect syncope.  His wife will continue to be careful and be around him all the time going forward and if he has unexplained syncope we could always consider loop recorder implantation.  There is no clinical evidence of heart failure, EKG is back to baseline and is no more tachycardic.  Stable from cardiac standpoint.  Thank you for the consultation.   Adrian Prows, MD, [facility] 11/22/2021, 9:21 AM Office: [phone]

## 2021-11-23 ENCOUNTER — Encounter: Payer: Self-pay | Admitting: Neurology

## 2021-11-23 ENCOUNTER — Inpatient Hospital Stay (HOSPITAL_COMMUNITY): Payer: PPO

## 2021-11-23 LAB — BASIC METABOLIC PANEL
Anion gap: 9 (ref 5–15)
BUN: 15 mg/dL (ref 8–23)
CO2: 23 mmol/L (ref 22–32)
Calcium: 8.5 mg/dL — ABNORMAL LOW (ref 8.9–10.3)
Chloride: 106 mmol/L (ref 98–111)
Creatinine, Ser: 1 mg/dL (ref 0.61–1.24)
GFR, Estimated: >60 mL/min (ref >60)
Glucose, Bld: 101 mg/dL — ABNORMAL HIGH (ref 70–99)
Potassium: 4.2 mmol/L (ref 3.5–5.1)
Sodium: 138 mmol/L (ref 135–145)

## 2021-11-23 LAB — CBC
HCT: 48.4 % (ref 39.0–52.0)
Hemoglobin: 15.9 g/dL (ref 13.0–17.0)
MCH: 30.2 pg (ref 26.0–34.0)
MCHC: 32.9 g/dL (ref 30.0–36.0)
MCV: 92 fL (ref 80.0–100.0)
Platelets: 181 10*3/uL (ref 150–400)
RBC: 5.26 MIL/uL (ref 4.22–5.81)
RDW: 13.2 % (ref 11.5–15.5)
WBC: 10 10*3/uL (ref 4.0–10.5)
nRBC: 0 % (ref 0.0–0.2)

## 2021-11-23 MED ORDER — CHLORHEXIDINE GLUCONATE CLOTH 2 % EX PADS
6.0000 | MEDICATED_PAD | Freq: Every day | CUTANEOUS | Status: DC
  Administered 2021-11-23 – 2021-11-25 (×3): 6 via TOPICAL

## 2021-11-23 MED ORDER — TAMSULOSIN HCL 0.4 MG PO CAPS
0.4000 mg | ORAL_CAPSULE | Freq: Every day | ORAL | Status: DC
  Administered 2021-11-23 – 2021-11-25 (×3): 0.4 mg via ORAL
  Filled 2021-11-23 (×3): qty 1

## 2021-11-23 MED ORDER — METOPROLOL TARTRATE 5 MG/5ML IV SOLN
2.5000 mg | Freq: Once | INTRAVENOUS | Status: AC
  Administered 2021-11-23: 2.5 mg via INTRAVENOUS
  Filled 2021-11-23: qty 5

## 2021-11-23 NOTE — Progress Notes (Signed)
°   11/23/21 2220  Provider Notification  Provider Name/Title Dr Cyd Silence  Date Provider Notified 11/23/21  Time Provider Notified 2220  Notification Type Page  Notification Reason Other (Comment) (Pt's HR in the 160s - 170s)  Provider response See new orders  Date of Provider Response 11/23/21  Time of Provider Response 2236

## 2021-11-23 NOTE — TOC Progression Note (Signed)
Transition of Care [facility]) - Progression Note    Patient Details  Name: Jeffrey [name] MRN: [id number] Date of Birth: 10-17-1941  Transition of Care [facility]) CM/SW [place],  Phone Number: 11/23/2021, 11:09 AM  Clinical Narrative:   CSW spoke with Butch Penny at [place], confirmed bed availability at [facility] for patient when ready. CSW to follow.    Expected Discharge Plan: [place] Barriers to Discharge: Continued Medical Work up  Expected Discharge Plan and Services Expected Discharge Plan: [place] Choice: [place] arrangements for the past 2 months: [place]                                       Social Determinants of Health (SDOH) Interventions    Readmission Risk Interventions No flowsheet data found.

## 2021-11-23 NOTE — Progress Notes (Signed)
°   11/23/21 8891  Provider Notification  Provider Name/Title Dr Johnette Abraham. Chen  Date Provider Notified 11/23/21  Time Provider Notified [phone]  Notification Type Page  Notification Reason Other (Comment) (Pt's BP read 164/90)  Provider response No new orders  Date of Provider Response 11/23/21  Time of Provider Response [phone]

## 2021-11-23 NOTE — Progress Notes (Signed)
Pt bladder scanned to a volume of 421ml  at 2328, had a BM prior but voided about 50cc, Pt agitated with HR fluctuating up to the 140s, had IV Metoprolol 5mg at 2322 as ordered, In and Out Cath done, drained 350ml of urine out, met with slight resistance during penetration, catheter slightly bloody afterwards, pt cleaned up and made comfortable in bed, HR now down to 97, pt and wife at bedside reassured, will however continue to monitor. Ob[place]-Asidi,  Efe ° °

## 2021-11-23 NOTE — Progress Notes (Signed)
PROGRESS NOTE    Jeffrey [name]  OFB:[id number] DOB: 1940/12/15 DOA: 11/21/2021 PCP: Larey Seat, MD   Chief Complain: Fall  Brief Narrative: Patient is a 81 year old male with history of dementia, frequent falls, hypertension, hyperlipidemia, OSA on CPAP, CKD stage IIIa, history of skin cancer, subarachnoid hemorrhage in 2021 due to fall, carotid artery disease who presents to the emergency department after fall at independent living facility.  He was found by his wife on the ground.  CT head showed small acute bilateral subdural hematoma without mass-effect, small volume acute hemorrhage in the fourth ventricle.  Neurosurgery was consulted ,there is no plan for intervention.  Hospitalist remarkable for persistent altered mental status worse than his baseline and also urinary retention.  PT/OT reevaluation done, recommended skilled nursing facility on discharge.  Plan for discharge tomorrow if remains hemodynamically stable and new CT head remained stable  Assessment & Plan:   Active Problems:   Hyperlipidemia   Essential hypertension   possible syncope and collapse   Hypersomnia with sleep apnea   Alzheimer's dementia (HCC)   Frequent falls   Bilateral subdural hematomas   Hyperkalemia   Dysphagia   Ectropion of right lower eyelid   CKD (chronic kidney disease), stage III (HCC)   Subdural hematoma   Bilateral subdural hematoma: Presented after a fall.  CT showed bilateral acute subdural hematoma.  Repeat CT done on 1/10 showed lobulated intracerebral hematoma in the posterior left temporal lobe, interval increase in the amount of blood in the third and both lateral ventricles.  Possible subarachnoid hemorrhage.  Case was discussed with neurosurgery, no operative intervention recommended, recommended supportive care.  Neurosurgery will follow. Patient remains confused today.  We are repeating CT head for follow-up  Acute encephalopathy/dementia: Has history of dementia and is  intermittently confused.  More confused at present.  Multifactorial secondary to subdural hematoma/contusion/delirium.  Continue delirium precautions, frequent reorientation.  Patient was agitated yesterday.  On Seroquel as needed. On rivastigmine.  Elevated troponin: Likely from supply demand ischemia, less likely from ACS.  Cardiology was consulted.  No further work-up recommended.  Hypertension: Currently on ramipril.  Monitor blood pressure.  Will increase the dose of ramipril if he becomes hypertensive.  Sleep apnea: On CPAP  CKD stage IIIa: Currently kidney function stable  Urine retention: We will give a voiding trial.  Started on tamsulosin  Debility/deconditioning: Patient resides at independent living facility.  PT/OT recommended skilled nursing facility on discharge.          DVT prophylaxis:SCD Code Status: Full Family Communication: daughter at bedside Patient status:Inpatient  Dispo: The patient is from: ILF              Anticipated d/c is to: SNF              Anticipated d/c date is: in 1-2 days  Consultants: Neurosurgery, cardiology  Procedures: None  Antimicrobials:  Anti-infectives (From admission, onward)    None       Subjective: Patient seen and examined at the bedside this morning.  Hemodynamically stable.  Lying on the bed.  Daughter at the bedside.  Not agitated today.  Sleepy/drowsy.  Talks, obeys commands but not oriented.  Objective: Vitals:   11/23/21 0507 11/23/21 0620 11/23/21 0751 11/23/21 0800  BP: (!) 168/85 (!) 164/90 (!) 156/77   Pulse: 79 79 77   Resp:      Temp:   98.6 F (37 C)   TempSrc:   Oral   SpO2:  94% 94%  Weight:      Height:        Intake/Output Summary (Last 24 hours) at 11/23/2021 0936 Last data filed at 11/23/2021 5053 Gross per 24 hour  Intake 1049.97 ml  Output 1500 ml  Net -450.03 ml   Filed Weights   11/22/21 0759  Weight: 77.3 kg    Examination:  General exam: Deconditioned elderly  male HEENT: Flap on the right face from squamous cell cancer surgery Respiratory system:  no wheezes or crackles  Cardiovascular system: S1 & S2 heard, RRR.  Gastrointestinal system: Abdomen is nondistended, soft and nontender. Central nervous system: Alert and awake but  not oriented Extremities: No edema, no clubbing ,no cyanosis Skin: No rashes, no ulcers,no icterus  GU: Foley    Data Reviewed: I have personally reviewed following labs and imaging studies  CBC: Recent Labs  Lab 11/21/21 1050 11/22/21 1402 11/23/21 0349  WBC 11.1* 12.1* 10.0  NEUTROABS 9.1*  --   --   HGB 17.7* 15.9 15.9  HCT 54.0* 48.7 48.4  MCV 93.3 90.9 92.0  PLT 198 199 976   Basic Metabolic Panel: Recent Labs  Lab 11/21/21 1050 11/21/21 2051 11/22/21 1402 11/23/21 0349  NA 138 134* 137 138  K 5.2* 3.8 3.9 4.2  CL 104 100 102 106  CO2 24 22 24 23   GLUCOSE 126* 168* 111* 101*  BUN 20 14 17 15   CREATININE 1.10 1.11 1.08 1.00  CALCIUM 9.4 9.3 8.9 8.5*   GFR: Estimated Creatinine Clearance: 64.4 mL/min (by C-G formula based on SCr of 1 mg/dL). Liver Function Tests: No results for input(s): AST, ALT, ALKPHOS, BILITOT, PROT, ALBUMIN in the last 168 hours. No results for input(s): LIPASE, AMYLASE in the last 168 hours. Recent Labs  Lab 11/22/21 0936  AMMONIA 29   Coagulation Profile: Recent Labs  Lab 11/22/21 1402  INR 1.1   Cardiac Enzymes: Recent Labs  Lab 11/21/21 2051  CKTOTAL 318   BNP (last 3 results) No results for input(s): PROBNP in the last 8760 hours. HbA1C: No results for input(s): HGBA1C in the last 72 hours. CBG: Recent Labs  Lab 11/21/21 1037 11/22/21 0823  GLUCAP 124* 144*   Lipid Profile: No results for input(s): CHOL, HDL, LDLCALC, TRIG, CHOLHDL, LDLDIRECT in the last 72 hours. Thyroid Function Tests: Recent Labs    11/21/21 2051  TSH 0.476   Anemia Panel: Recent Labs    11/22/21 0936  VITAMINB12 253   Sepsis Labs: No results for input(s):  PROCALCITON, LATICACIDVEN in the last 168 hours.  No results found for this or any previous visit (from the past 240 hour(s)).       Radiology Studies: DG Wrist 2 Views Right  Result Date: 11/21/2021 CLINICAL DATA:  Fall, dementia EXAM: RIGHT WRIST - 2 VIEW COMPARISON:  None. FINDINGS: There is no evidence of fracture or dislocation. There is no evidence of arthropathy or other focal bone abnormality. Soft tissues are unremarkable. IMPRESSION: Negative. Electronically Signed   By: Keane Police D.O.   On: 11/21/2021 16:19   DG Wrist Complete Right  Result Date: 11/21/2021 CLINICAL DATA:  Trauma, pain EXAM: RIGHT WRIST - COMPLETE 3+ VIEW COMPARISON:  None. FINDINGS: No recent fracture or dislocation is seen. Degenerative changes are noted with bony spurs in first carpometacarpal joint and the interphalangeal joint of thumb. Minimal bony spurs seen in the lateral radiocarpal joint. IMPRESSION: No recent fracture or dislocation is seen in the right wrist. Electronically Signed  By: Elmer Picker M.D.   On: 11/21/2021 11:29   CT HEAD WO CONTRAST (5MM)  Result Date: 11/22/2021 CLINICAL DATA:  Intracranial bleeding, trauma EXAM: CT HEAD WITHOUT CONTRAST TECHNIQUE: Contiguous axial images were obtained from the base of the skull through the vertex without intravenous contrast. COMPARISON:  11/21/2021 FINDINGS: Brain: There is interval appearance of large lobulated intra cerebral hematoma measuring a proximally 4.7 x 2.9 x 2.8 cm in the left posterior temporal lobe. Estimated volume is approximately 19 mL. There is surrounding edema. There is 6 mm area of bleeding in the right posterior third ventricle. There is interval layering of small amount of blood in the dependent portions of occipital lobes in both lateral ventricles. Small 3 mm subdural hematoma seen in the periphery of left cerebral hemisphere is less evident. There is increase in amount of CSF density adjacent to both frontal lobes, more so  on the right side measuring up to 10 mm in the diameter. There is no shift of midline structures. There is increased density between the leaves tentorium with interval worsening more so on the left side. There is subtle increased density in the posterior right sylvian fissure, possibly presence of subarachnoid hemorrhage. Similar finding to a lesser extent is seen in the posterior left sylvian fissure. Vascular: Scattered arterial calcifications are seen. Skull: No displaced fracture is seen. Sinuses/Orbits: Sinuses are unremarkable. There is a metallic density in the anterior right orbit. IMPRESSION: There is interval appearance of 4.7 x 2.9 x 2.8 cm lobulated intra cerebral hematoma in the posterior left temporal lobe. There is interval increase in the amount of blood in the third and both lateral ventricles. There is increased amount of blood between the leaves of the tentorium, more so on the left side. There is subtle increased density in the posterior aspects of sylvian fissure on both sides, more so on the right side possibly suggesting subarachnoid hemorrhage. 3 mm acute left subdural hematoma seen in the previous examination appears less prominent. There is increased amount of CSF density in the periphery of both frontal lobes, more so on the right side measuring up to 10 mm in thickness without definite evidence of recent bleeding. Imaging findings were relayed to Dr. Tyrell Antonio by telephone call. She will contact neurosurgeon. Electronically Signed   By: Elmer Picker M.D.   On: 11/22/2021 11:41   CT Head Wo Contrast  Addendum Date: 11/21/2021   ADDENDUM REPORT: 11/21/2021 11:59 ADDENDUM: Posterior scalp laceration. Electronically Signed   By: Logan Bores M.D.   On: 11/21/2021 11:59   Result Date: 11/21/2021 CLINICAL DATA:  Head trauma.  Fall today.  Head and neck pain. EXAM: CT HEAD WITHOUT CONTRAST CT CERVICAL SPINE WITHOUT CONTRAST TECHNIQUE: Multidetector CT imaging of the head and cervical  spine was performed following the standard protocol without intravenous contrast. Multiplanar CT image reconstructions of the cervical spine were also generated. COMPARISON:  CT head and cervical spine 03/05/2021 FINDINGS: CT HEAD FINDINGS Brain: A new small hyperdense subdural hematoma over the left lateral cerebral convexity measures up to 3 mm in thickness without mass effect. Trace subdural hemorrhage is also present along the posterior aspect of the falx and along the left greater than right tentorial leaflets, and there is also new trace hypoattenuating subdural fluid or blood over the right cerebral convexity. There is minimal acute hemorrhage in the fourth ventricle. There is also a small extra-axial focus of curvilinear hyperdensity along the posterolateral aspect of third ventricle coursing towards the pineal region (  series 4, image 16). No acute cortically based infarct or midline shift is evident. There is mild cerebral atrophy. Patchy hypodensities in the cerebral white matter bilaterally are unchanged and nonspecific but compatible with moderate chronic small vessel ischemic disease. Vascular: Calcified atherosclerosis at the skull base. Skull: No acute fracture or suspicious osseous lesion. Sinuses/Orbits: Clear paranasal sinuses. Unchanged large right mastoid effusion. Right eyelid weight. Bilateral cataract extraction. Other: Postoperative changes involving the right parotid gland. CT CERVICAL SPINE FINDINGS Alignment: Chronic trace anterolisthesis of C7 on T1. Skull base and vertebrae: No acute fracture or suspicious osseous lesion. Soft tissues and spinal canal: No prevertebral fluid or swelling. No visible canal hematoma. Disc levels: Multilevel disc degeneration, moderate at C6-7. Asymmetrically advanced right-sided cervical facet arthrosis at multiple levels. Right facet ankylosis at C2-3. Moderate multilevel neural foraminal stenosis and at least mild multilevel spinal stenosis. Upper chest:  Clear lung apices. Other: Postoperative changes in the right upper neck. IMPRESSION: 1. Small acute bilateral subdural hematomas without mass effect. 2. Small volume acute hemorrhage in the fourth ventricle. 3. Small hyperdensity along the posterolateral aspect of the third ventricle on the right, likely additional extra-axial hemorrhage although deep cerebral vein thrombosis is an alternative consideration. 4. Moderate chronic small vessel ischemic disease. 5. No acute cervical spine fracture. Critical Value/emergent results were called by telephone at the time of interpretation on 11/21/2021 at 11:47 am to provider Myna Bright, who verbally acknowledged these results. Electronically Signed: By: Logan Bores M.D. On: 11/21/2021 11:54   CT Cervical Spine Wo Contrast  Addendum Date: 11/21/2021   ADDENDUM REPORT: 11/21/2021 11:59 ADDENDUM: Posterior scalp laceration. Electronically Signed   By: Logan Bores M.D.   On: 11/21/2021 11:59   Result Date: 11/21/2021 CLINICAL DATA:  Head trauma.  Fall today.  Head and neck pain. EXAM: CT HEAD WITHOUT CONTRAST CT CERVICAL SPINE WITHOUT CONTRAST TECHNIQUE: Multidetector CT imaging of the head and cervical spine was performed following the standard protocol without intravenous contrast. Multiplanar CT image reconstructions of the cervical spine were also generated. COMPARISON:  CT head and cervical spine 03/05/2021 FINDINGS: CT HEAD FINDINGS Brain: A new small hyperdense subdural hematoma over the left lateral cerebral convexity measures up to 3 mm in thickness without mass effect. Trace subdural hemorrhage is also present along the posterior aspect of the falx and along the left greater than right tentorial leaflets, and there is also new trace hypoattenuating subdural fluid or blood over the right cerebral convexity. There is minimal acute hemorrhage in the fourth ventricle. There is also a small extra-axial focus of curvilinear hyperdensity along the posterolateral  aspect of third ventricle coursing towards the pineal region (series 4, image 16). No acute cortically based infarct or midline shift is evident. There is mild cerebral atrophy. Patchy hypodensities in the cerebral white matter bilaterally are unchanged and nonspecific but compatible with moderate chronic small vessel ischemic disease. Vascular: Calcified atherosclerosis at the skull base. Skull: No acute fracture or suspicious osseous lesion. Sinuses/Orbits: Clear paranasal sinuses. Unchanged large right mastoid effusion. Right eyelid weight. Bilateral cataract extraction. Other: Postoperative changes involving the right parotid gland. CT CERVICAL SPINE FINDINGS Alignment: Chronic trace anterolisthesis of C7 on T1. Skull base and vertebrae: No acute fracture or suspicious osseous lesion. Soft tissues and spinal canal: No prevertebral fluid or swelling. No visible canal hematoma. Disc levels: Multilevel disc degeneration, moderate at C6-7. Asymmetrically advanced right-sided cervical facet arthrosis at multiple levels. Right facet ankylosis at C2-3. Moderate multilevel neural foraminal stenosis and at  least mild multilevel spinal stenosis. Upper chest: Clear lung apices. Other: Postoperative changes in the right upper neck. IMPRESSION: 1. Small acute bilateral subdural hematomas without mass effect. 2. Small volume acute hemorrhage in the fourth ventricle. 3. Small hyperdensity along the posterolateral aspect of the third ventricle on the right, likely additional extra-axial hemorrhage although deep cerebral vein thrombosis is an alternative consideration. 4. Moderate chronic small vessel ischemic disease. 5. No acute cervical spine fracture. Critical Value/emergent results were called by telephone at the time of interpretation on 11/21/2021 at 11:47 am to provider Myna Bright, who verbally acknowledged these results. Electronically Signed: By: Logan Bores M.D. On: 11/21/2021 11:54   ECHOCARDIOGRAM  COMPLETE  Result Date: 11/22/2021    ECHOCARDIOGRAM REPORT   Patient Name:   ELIAH MARQUARD [facility] Date of Exam: 11/22/2021 Medical Rec #:  [id number]        Height:       72.0 in Accession #:    [id number]       Weight:       170.5 lb Date of Birth:  Mar 02, 1941        BSA:          1.991 m Patient Age:    73 years         BP:           107/67 mmHg Patient Gender: M                HR:           82 bpm. Exam Location:  Inpatient Procedure: 2D Echo, Cardiac Doppler and Color Doppler Indications:    syncope  History:        Patient has prior history of Echocardiogram examinations, most                 recent 08/29/2019. Risk Factors:Hypertension. Hyperlipidemia.  Sonographer:    Beryle Beams Referring Phys: [id number] [place]  1. Left ventricular ejection fraction, by estimation, is 55 to 60%. The left ventricle has normal function. Left ventricular endocardial border not optimally defined to evaluate regional wall motion. There may be hypokinesis of the apical segments of the inferior wall and inferior septum. Left ventricular diastolic parameters are consistent with Grade I diastolic dysfunction (impaired relaxation).  2. Right ventricular systolic function is normal. The right ventricular size is normal.  3. The mitral valve is normal in structure. No evidence of mitral valve regurgitation. No evidence of mitral stenosis.  4. The aortic valve is normal in structure. Aortic valve regurgitation is not visualized. No aortic stenosis is present.  5. The inferior vena cava is normal in size with greater than 50% respiratory variability, suggesting right atrial pressure of 3 mmHg. FINDINGS  Left Ventricle: Left ventricular ejection fraction, by estimation, is 55 to 60%. The left ventricle has normal function. Left ventricular endocardial border not optimally defined to evaluate regional wall motion. The left ventricular internal cavity size was normal in size. There is no left ventricular hypertrophy. Left  ventricular diastolic parameters are consistent with Grade I diastolic dysfunction (impaired relaxation). Indeterminate filling pressures. Right Ventricle: Ivc not seen. The right ventricular size is normal. No increase in right ventricular wall thickness. Right ventricular systolic function is normal. Left Atrium: Left atrial size was normal in size. Right Atrium: Right atrial size was normal in size. Pericardium: Trivial pericardial effusion is present. Mitral Valve: The mitral valve is normal in structure. No evidence of mitral valve regurgitation. No evidence of mitral  valve stenosis. Tricuspid Valve: The tricuspid valve is normal in structure. Tricuspid valve regurgitation is not demonstrated. No evidence of tricuspid stenosis. Aortic Valve: The aortic valve is normal in structure. Aortic valve regurgitation is not visualized. No aortic stenosis is present. Aortic valve mean gradient measures 3.0 mmHg. Aortic valve peak gradient measures 5.1 mmHg. Aortic valve area, by VTI measures 2.33 cm. Pulmonic Valve: The pulmonic valve was normal in structure. Pulmonic valve regurgitation is not visualized. No evidence of pulmonic stenosis. Aorta: The aortic root is normal in size and structure. Venous: The inferior vena cava is normal in size with greater than 50% respiratory variability, suggesting right atrial pressure of 3 mmHg. IAS/Shunts: No atrial level shunt detected by color flow Doppler.  LEFT VENTRICLE PLAX 2D LVIDd:         4.80 cm     Diastology LVIDs:         2.80 cm     LV e' medial:    4.46 cm/s LV PW:         1.10 cm     LV E/e' medial:  12.1 LV IVS:        0.70 cm     LV e' lateral:   4.46 cm/s LVOT diam:     1.80 cm     LV E/e' lateral: 12.1 LV SV:         48 LV SV Index:   24 LVOT Area:     2.54 cm  LV Volumes (MOD) LV vol d, MOD A2C: 35.3 ml LV vol d, MOD A4C: 42.3 ml LV vol s, MOD A2C: 13.3 ml LV vol s, MOD A4C: 19.1 ml LV SV MOD A2C:     22.0 ml LV SV MOD A4C:     42.3 ml LV SV MOD BP:      24.4  ml RIGHT VENTRICLE RV S prime:     9.36 cm/s TAPSE (M-mode): 2.0 cm LEFT ATRIUM           Index        RIGHT ATRIUM          Index LA diam:      2.70 cm 1.36 cm/m   RA Area:     6.72 cm LA Vol (A2C): 33.8 ml 16.98 ml/m  RA Volume:   9.86 ml  4.95 ml/m LA Vol (A4C): 21.7 ml 10.90 ml/m  AORTIC VALVE                    PULMONIC VALVE AV Area (Vmax):    2.32 cm     PV Vmax:       0.88 m/s AV Area (Vmean):   2.60 cm     PV Peak grad:  3.1 mmHg AV Area (VTI):     2.33 cm AV Vmax:           113.00 cm/s AV Vmean:          75.100 cm/s AV VTI:            0.205 m AV Peak Grad:      5.1 mmHg AV Mean Grad:      3.0 mmHg LVOT Vmax:         103.00 cm/s LVOT Vmean:        76.700 cm/s LVOT VTI:          0.188 m LVOT/AV VTI ratio: 0.92  AORTA Ao Root diam: 2.60 cm Ao Asc diam:  2.50 cm MITRAL VALVE  TRICUSPID VALVE MV Area (PHT): 5.54 cm     TV Peak grad:   13.4 mmHg MV Decel Time: 137 msec     TV Mean grad:   11.0 mmHg MV E velocity: 53.90 cm/s   TV Vmax:        1.83 m/s MV A velocity: 103.00 cm/s  TV Vmean:       159.0 cm/s MV E/A ratio:  0.52         TV VTI:         0.58 msec                              SHUNTS                             Systemic VTI:  0.19 m                             Systemic Diam: 1.80 cm Sanda Klein MD Electronically signed by Sanda Klein MD Signature Date/Time: 11/22/2021/10:29:33 AM    Final    VAS US CAROTID  Result Date: 11/22/2021 Carotid Arterial Duplex Study Patient Name:  MEADE HOGELAND [facility]  Date of Exam:   11/22/2021 Medical Rec #: [id number]         Accession #:    [id number] Date of Birth: [birth year]         Patient Gender: M Patient Age:   86 years Exam Location:  [facility] Procedure:      VAS US CAROTID Referring Phys: Orma Flaming --------------------------------------------------------------------------------  Indications:       History of carotid stenosis. Risk Factors:      Hypertension, hyperlipidemia, past history of smoking. Other Factors:      Dementia, multiple falls. Comparison Study:  09-08-2020 Most recent prior carotid duplex consistent with                    50-69% ICA stenosis bilaterally. Performing Technologist: Darlin Coco RDMS, RVT  Examination Guidelines: A complete evaluation includes B-mode imaging, spectral Doppler, color Doppler, and power Doppler as needed of all accessible portions of each vessel. Bilateral testing is considered an integral part of a complete examination. Limited examinations for reoccurring indications may be performed as noted.  Right Carotid Findings: +----------+--------+--------+--------+------------------+---------------------+             PSV cm/s EDV cm/s Stenosis Plaque Description Comments               +----------+--------+--------+--------+------------------+---------------------+  CCA Prox   64       21                                   intimal thickening     +----------+--------+--------+--------+------------------+---------------------+  CCA Distal 50       17                hyperechoic,  heterogenous and                                                                 irregular                                 +----------+--------+--------+--------+------------------+---------------------+  ICA Prox   289      66       60-79%                                             +----------+--------+--------+--------+------------------+---------------------+  ICA Mid    287      49                                   post-stenotic                                                                    turbulence             +----------+--------+--------+--------+------------------+---------------------+  ICA Distal 54       16                                   tortuous               +----------+--------+--------+--------+------------------+---------------------+  ECA        72       22                                                           +----------+--------+--------+--------+------------------+---------------------+ +----------+--------+-------+----------------+-------------------+             PSV cm/s EDV cms Describe         Arm Pressure (mmHG)  +----------+--------+-------+----------------+-------------------+  Subclavian 155              Multiphasic, WNL                      +----------+--------+-------+----------------+-------------------+ +---------+--------+--+--------+--+---------+  Vertebral PSV cm/s 46 EDV cm/s 11 Antegrade  +---------+--------+--+--------+--+---------+  Left Carotid Findings: +----------+--------+--------+--------+-------------------+------------------+             PSV cm/s EDV cm/s Stenosis Plaque Description  Comments            +----------+--------+--------+--------+-------------------+------------------+  CCA Prox   67       15                                                        +----------+--------+--------+--------+-------------------+------------------+  CCA Distal 72       23                                    intimal thickening  +----------+--------+--------+--------+-------------------+------------------+  ICA Prox   67       21       1-39%    heterogenous , mild                     +----------+--------+--------+--------+-------------------+------------------+  ICA Mid    54       19                                                        +----------+--------+--------+--------+-------------------+------------------+  ICA Distal 58       22                                                        +----------+--------+--------+--------+-------------------+------------------+  ECA        154      27       >50%                                             +----------+--------+--------+--------+-------------------+------------------+ +----------+--------+--------+----------------+-------------------+             PSV cm/s EDV cm/s Describe         Arm Pressure (mmHG)   +----------+--------+--------+----------------+-------------------+  Subclavian 118               Multiphasic, WNL                      +----------+--------+--------+----------------+-------------------+ +---------+--------+--+--------+--+---------+  Vertebral PSV cm/s 40 EDV cm/s 11 Antegrade  +---------+--------+--+--------+--+---------+   Summary: Right Carotid: Velocities in the right ICA are consistent with a 60-79%                stenosis. Left Carotid: Velocities in the left ICA are consistent with a 1-39% stenosis.               The ECA appears >50% stenosed. Vertebrals:  Bilateral vertebral arteries demonstrate antegrade flow. Subclavians: Normal flow hemodynamics were seen in bilateral subclavian              arteries. *See table(s) above for measurements and observations.  Electronically signed by Harold Barban MD on 11/22/2021 at 9:42:31 PM.    Final         Scheduled Meds:  acetaminophen  500 mg Oral TID   Chlorhexidine Gluconate Cloth  6 each Topical Daily   citalopram  10 mg Oral Daily   ofloxacin  1 drop Right Eye BID   ramipril  2.5 mg Oral Daily   rivastigmine  4.6 mg Transdermal Daily   sodium chloride flush  3 mL Intravenous Q12H   tamsulosin  0.4 mg Oral Daily   Continuous Infusions:  sodium chloride       LOS: 1 day  Time spent: 35 mins.More than 50% of that time was spent in counseling and/or coordination of care.      Shelly Coss, MD [facility] P1/09/2022, 9:36 AM

## 2021-11-24 ENCOUNTER — Inpatient Hospital Stay (HOSPITAL_COMMUNITY): Payer: PPO | Admitting: Certified Registered Nurse Anesthetist

## 2021-11-24 ENCOUNTER — Encounter (HOSPITAL_COMMUNITY): Payer: Self-pay | Admitting: Internal Medicine

## 2021-11-24 ENCOUNTER — Encounter (HOSPITAL_COMMUNITY)
Admission: EM | Disposition: A | Discharge status: Skilled Nursing Facility | Payer: Self-pay | Source: Skilled Nursing Facility | Attending: Internal Medicine

## 2021-11-24 LAB — BASIC METABOLIC PANEL
Anion gap: 11 (ref 5–15)
Anion gap: 10 (ref 5–15)
BUN: 15 mg/dL (ref 8–23)
BUN: 15 mg/dL (ref 8–23)
CO2: 24 mmol/L (ref 22–32)
CO2: 24 mmol/L (ref 22–32)
Calcium: 8.5 mg/dL — ABNORMAL LOW (ref 8.9–10.3)
Calcium: 8.6 mg/dL — ABNORMAL LOW (ref 8.9–10.3)
Chloride: 97 mmol/L — ABNORMAL LOW (ref 98–111)
Chloride: 99 mmol/L (ref 98–111)
Creatinine, Ser: 1.04 mg/dL (ref 0.61–1.24)
Creatinine, Ser: 0.94 mg/dL (ref 0.61–1.24)
GFR, Estimated: >60 mL/min (ref >60)
GFR, Estimated: >60 mL/min (ref >60)
Glucose, Bld: 152 mg/dL — ABNORMAL HIGH (ref 70–99)
Glucose, Bld: 142 mg/dL — ABNORMAL HIGH (ref 70–99)
Potassium: 3.6 mmol/L (ref 3.5–5.1)
Potassium: 3.6 mmol/L (ref 3.5–5.1)
Sodium: 132 mmol/L — ABNORMAL LOW (ref 135–145)
Sodium: 133 mmol/L — ABNORMAL LOW (ref 135–145)

## 2021-11-24 LAB — CBC WITH DIFFERENTIAL/PLATELET
Abs Immature Granulocytes: 0.11 10*3/uL — ABNORMAL HIGH (ref 0.00–0.07)
Basophils Absolute: 0 10*3/uL (ref 0.0–0.1)
Basophils Relative: 0 %
Eosinophils Absolute: 0 10*3/uL (ref 0.0–0.5)
Eosinophils Relative: 0 %
HCT: 43.4 % (ref 39.0–52.0)
Hemoglobin: 14.4 g/dL (ref 13.0–17.0)
Immature Granulocytes: 1 %
Lymphocytes Relative: 9 %
Lymphs Abs: 1.1 10*3/uL (ref 0.7–4.0)
MCH: 30 pg (ref 26.0–34.0)
MCHC: 33.2 g/dL (ref 30.0–36.0)
MCV: 90.4 fL (ref 80.0–100.0)
Monocytes Absolute: 1 10*3/uL (ref 0.1–1.0)
Monocytes Relative: 8 %
Neutro Abs: 10.2 10*3/uL — ABNORMAL HIGH (ref 1.7–7.7)
Neutrophils Relative %: 82 %
Platelets: 168 10*3/uL (ref 150–400)
RBC: 4.8 MIL/uL (ref 4.22–5.81)
RDW: 12.8 % (ref 11.5–15.5)
WBC: 12.5 10*3/uL — ABNORMAL HIGH (ref 4.0–10.5)
nRBC: 0 % (ref 0.0–0.2)

## 2021-11-24 LAB — MAGNESIUM: Magnesium: 1.8 mg/dL (ref 1.7–2.4)

## 2021-11-24 SURGERY — CANCELLED PROCEDURE

## 2021-11-24 MED ORDER — METOPROLOL TARTRATE 12.5 MG HALF TABLET
12.5000 mg | ORAL_TABLET | Freq: Two times a day (BID) | ORAL | Status: DC
  Administered 2021-11-24: 12.5 mg via ORAL
  Filled 2021-11-24: qty 1

## 2021-11-24 MED ORDER — MAGNESIUM SULFATE IN D5W 1-5 GM/100ML-% IV SOLN
1.0000 g | Freq: Once | INTRAVENOUS | Status: AC
  Administered 2021-11-24: 1 g via INTRAVENOUS
  Filled 2021-11-24: qty 100

## 2021-11-24 MED ORDER — POTASSIUM CHLORIDE 20 MEQ PO PACK
40.0000 meq | PACK | Freq: Once | ORAL | Status: AC
  Administered 2021-11-24: 40 meq via ORAL
  Filled 2021-11-24: qty 2

## 2021-11-24 MED ORDER — LIDOCAINE HCL URETHRAL/MUCOSAL 2 % EX GEL
1.0000 "application " | Freq: Once | CUTANEOUS | Status: AC
  Administered 2021-11-24: 1 via URETHRAL
  Filled 2021-11-24 (×2): qty 6

## 2021-11-24 MED ORDER — METOPROLOL SUCCINATE ER 50 MG PO TB24
50.0000 mg | ORAL_TABLET | Freq: Every day | ORAL | Status: DC
  Administered 2021-11-24 – 2021-11-25 (×2): 50 mg via ORAL
  Filled 2021-11-24 (×2): qty 1

## 2021-11-24 NOTE — Progress Notes (Signed)
Pt transported to endo for cardioversion. Pt noted to be in SR when arrived in unit. 12 lead ekg done. Dr. Einar Gip called and looked at  EKG which shows sinus rhythm. Pt to go back to floor as procedure not needed. Wife at bedside and understands.  Report called to Doreen Beam  RN

## 2021-11-24 NOTE — Progress Notes (Signed)
Subjective: Patient reports mild headache and confused  Objective: Vital signs in last 24 hours: Temp:  [98.2 F (36.8 C)-99.4 F (37.4 C)] 99.4 F (37.4 C) (01/12 0309) Pulse Rate:  [70-112] 72 (01/12 0309) Resp:  [14-23] 23 (01/12 0309) BP: (113-199)/(71-107) 144/78 (01/12 0309) SpO2:  [91 %-96 %] 96 % (01/12 0309)  Intake/Output from previous day: 01/11 0701 - 01/12 0700 In: 511.7 [P.O.:420; I.V.:9; IV Piggyback:82.7] Out: 1599 [Urine:1599] Intake/Output this shift: No intake/output data recorded.  Neurologic: Still confused but MAE well and sitting up in bed. FC appropriately   Lab Results: Lab Results  Component Value Date   WBC 12.5 (H) 11/24/2021   HGB 14.4 11/24/2021   HCT 43.4 11/24/2021   MCV 90.4 11/24/2021   PLT 168 11/24/2021   Lab Results  Component Value Date   INR 1.1 11/22/2021   BMET Lab Results  Component Value Date   NA 133 (L) 11/24/2021   K 3.6 11/24/2021   CL 99 11/24/2021   CO2 24 11/24/2021   GLUCOSE 142 (H) 11/24/2021   BUN 15 11/24/2021   CREATININE 0.94 11/24/2021   CALCIUM 8.6 (L) 11/24/2021    Studies/Results: CT HEAD WO CONTRAST (5MM)  Result Date: 11/23/2021 CLINICAL DATA:  Stroke follow-up. EXAM: CT HEAD WITHOUT CONTRAST TECHNIQUE: Contiguous axial images were obtained from the base of the skull through the vertex without intravenous contrast. RADIATION DOSE REDUCTION: This exam was performed according to the departmental dose-optimization program which includes automated exposure control, adjustment of the mA and/or kV according to patient size and/or use of iterative reconstruction technique. COMPARISON:  CT head 11/22/2021 FINDINGS: Brain: A parenchymal hematoma inferiorly in the posterior left temporal lobe has mildly increased in size, measuring 3.0 x 5.9 x 2.4 cm (estimated volume of 21 mL). Mild surrounding edema is unchanged, as is mass effect on the temporal horn of the left lateral ventricle. There is trace hemorrhage in  the occipital horns of the lateral ventricles which has decreased. Minimal hemorrhage along the posterior aspect of the third ventricle/medial margin of the right thalamus is unchanged. Minimal subarachnoid hemorrhage near the posterior aspect of the left sylvian fissure is unchanged. Small subdural hematomas over the cerebral convexities are unchanged and measure up to 8 mm in thickness on the right and 3 mm on the left. A small amount of subdural blood along the tentorium bilaterally is also unchanged. There is no significant midline shift. There is no evidence of an acute infarct, new hemorrhage, or hydrocephalus. Patchy hypodensities in the cerebral white matter bilaterally are unchanged and nonspecific but compatible with moderate chronic small vessel ischemic disease. There is mild cerebral atrophy. Vascular: Calcified atherosclerosis at the skull base. Skull: No acute fracture or suspicious osseous lesion. Sinuses/Orbits: Unchanged large right mastoid effusion. Clear paranasal sinuses. Bilateral cataract extraction. Right eyelid weight. Other: Posterior scalp skin staples with mild swelling/contusion. Postoperative changes in the right parotid region. IMPRESSION: 1. Mild enlargement of the left temporal lobe hematoma. Unchanged mild edema. 2. Unchanged small subdural hematomas and minimal subarachnoid hemorrhage. 3. Decreased intraventricular hemorrhage. Electronically Signed   By: Logan Bores M.D.   On: 11/23/2021 13:31   CT HEAD WO CONTRAST (5MM)  Result Date: 11/22/2021 CLINICAL DATA:  Intracranial bleeding, trauma EXAM: CT HEAD WITHOUT CONTRAST TECHNIQUE: Contiguous axial images were obtained from the base of the skull through the vertex without intravenous contrast. COMPARISON:  11/21/2021 FINDINGS: Brain: There is interval appearance of large lobulated intra cerebral hematoma measuring a proximally 4.7 x  2.9 x 2.8 cm in the left posterior temporal lobe. Estimated volume is approximately 19 mL.  There is surrounding edema. There is 6 mm area of bleeding in the right posterior third ventricle. There is interval layering of small amount of blood in the dependent portions of occipital lobes in both lateral ventricles. Small 3 mm subdural hematoma seen in the periphery of left cerebral hemisphere is less evident. There is increase in amount of CSF density adjacent to both frontal lobes, more so on the right side measuring up to 10 mm in the diameter. There is no shift of midline structures. There is increased density between the leaves tentorium with interval worsening more so on the left side. There is subtle increased density in the posterior right sylvian fissure, possibly presence of subarachnoid hemorrhage. Similar finding to a lesser extent is seen in the posterior left sylvian fissure. Vascular: Scattered arterial calcifications are seen. Skull: No displaced fracture is seen. Sinuses/Orbits: Sinuses are unremarkable. There is a metallic density in the anterior right orbit. IMPRESSION: There is interval appearance of 4.7 x 2.9 x 2.8 cm lobulated intra cerebral hematoma in the posterior left temporal lobe. There is interval increase in the amount of blood in the third and both lateral ventricles. There is increased amount of blood between the leaves of the tentorium, more so on the left side. There is subtle increased density in the posterior aspects of sylvian fissure on both sides, more so on the right side possibly suggesting subarachnoid hemorrhage. 3 mm acute left subdural hematoma seen in the previous examination appears less prominent. There is increased amount of CSF density in the periphery of both frontal lobes, more so on the right side measuring up to 10 mm in thickness without definite evidence of recent bleeding. Imaging findings were relayed to Dr. Tyrell Antonio by telephone call. She will contact neurosurgeon. Electronically Signed   By: Elmer Picker M.D.   On: 11/22/2021 11:41    ECHOCARDIOGRAM COMPLETE  Result Date: 11/22/2021    ECHOCARDIOGRAM REPORT   Patient Name:   Jeffrey [name] [facility] Date of Exam: 11/22/2021 Medical Rec #:  [id number]        Height:       72.0 in Accession #:    [id number]       Weight:       170.5 lb Date of Birth:  [birth year]        BSA:          1.991 m Patient Age:    81 years         BP:           107/67 mmHg Patient Gender: M                HR:           82 bpm. Exam Location:  Inpatient Procedure: 2D Echo, Cardiac Doppler and Color Doppler Indications:    syncope  History:        Patient has prior history of Echocardiogram examinations, most                 recent 08/29/2019. Risk Factors:Hypertension. Hyperlipidemia.  Sonographer:    Beryle Beams Referring Phys: [id number] [place]  1. Left ventricular ejection fraction, by estimation, is 55 to 60%. The left ventricle has normal function. Left ventricular endocardial border not optimally defined to evaluate regional wall motion. There may be hypokinesis of the apical segments of the inferior wall and inferior septum. Left ventricular  diastolic parameters are consistent with Grade I diastolic dysfunction (impaired relaxation).  2. Right ventricular systolic function is normal. The right ventricular size is normal.  3. The mitral valve is normal in structure. No evidence of mitral valve regurgitation. No evidence of mitral stenosis.  4. The aortic valve is normal in structure. Aortic valve regurgitation is not visualized. No aortic stenosis is present.  5. The inferior vena cava is normal in size with greater than 50% respiratory variability, suggesting right atrial pressure of 3 mmHg. FINDINGS  Left Ventricle: Left ventricular ejection fraction, by estimation, is 55 to 60%. The left ventricle has normal function. Left ventricular endocardial border not optimally defined to evaluate regional wall motion. The left ventricular internal cavity size was normal in size. There is no left ventricular  hypertrophy. Left ventricular diastolic parameters are consistent with Grade I diastolic dysfunction (impaired relaxation). Indeterminate filling pressures. Right Ventricle: Ivc not seen. The right ventricular size is normal. No increase in right ventricular wall thickness. Right ventricular systolic function is normal. Left Atrium: Left atrial size was normal in size. Right Atrium: Right atrial size was normal in size. Pericardium: Trivial pericardial effusion is present. Mitral Valve: The mitral valve is normal in structure. No evidence of mitral valve regurgitation. No evidence of mitral valve stenosis. Tricuspid Valve: The tricuspid valve is normal in structure. Tricuspid valve regurgitation is not demonstrated. No evidence of tricuspid stenosis. Aortic Valve: The aortic valve is normal in structure. Aortic valve regurgitation is not visualized. No aortic stenosis is present. Aortic valve mean gradient measures 3.0 mmHg. Aortic valve peak gradient measures 5.1 mmHg. Aortic valve area, by VTI measures 2.33 cm. Pulmonic Valve: The pulmonic valve was normal in structure. Pulmonic valve regurgitation is not visualized. No evidence of pulmonic stenosis. Aorta: The aortic root is normal in size and structure. Venous: The inferior vena cava is normal in size with greater than 50% respiratory variability, suggesting right atrial pressure of 3 mmHg. IAS/Shunts: No atrial level shunt detected by color flow Doppler.  LEFT VENTRICLE PLAX 2D LVIDd:         4.80 cm     Diastology LVIDs:         2.80 cm     LV e' medial:    4.46 cm/s LV PW:         1.10 cm     LV E/e' medial:  12.1 LV IVS:        0.70 cm     LV e' lateral:   4.46 cm/s LVOT diam:     1.80 cm     LV E/e' lateral: 12.1 LV SV:         48 LV SV Index:   24 LVOT Area:     2.54 cm  LV Volumes (MOD) LV vol d, MOD A2C: 35.3 ml LV vol d, MOD A4C: 42.3 ml LV vol s, MOD A2C: 13.3 ml LV vol s, MOD A4C: 19.1 ml LV SV MOD A2C:     22.0 ml LV SV MOD A4C:     42.3 ml LV SV  MOD BP:      24.4 ml RIGHT VENTRICLE RV S prime:     9.36 cm/s TAPSE (M-mode): 2.0 cm LEFT ATRIUM           Index        RIGHT ATRIUM          Index LA diam:      2.70 cm 1.36 cm/m   RA Area:  6.72 cm LA Vol (A2C): 33.8 ml 16.98 ml/m  RA Volume:   9.86 ml  4.95 ml/m LA Vol (A4C): 21.7 ml 10.90 ml/m  AORTIC VALVE                    PULMONIC VALVE AV Area (Vmax):    2.32 cm     PV Vmax:       0.88 m/s AV Area (Vmean):   2.60 cm     PV Peak grad:  3.1 mmHg AV Area (VTI):     2.33 cm AV Vmax:           113.00 cm/s AV Vmean:          75.100 cm/s AV VTI:            0.205 m AV Peak Grad:      5.1 mmHg AV Mean Grad:      3.0 mmHg LVOT Vmax:         103.00 cm/s LVOT Vmean:        76.700 cm/s LVOT VTI:          0.188 m LVOT/AV VTI ratio: 0.92  AORTA Ao Root diam: 2.60 cm Ao Asc diam:  2.50 cm MITRAL VALVE                TRICUSPID VALVE MV Area (PHT): 5.54 cm     TV Peak grad:   13.4 mmHg MV Decel Time: 137 msec     TV Mean grad:   11.0 mmHg MV E velocity: 53.90 cm/s   TV Vmax:        1.83 m/s MV A velocity: 103.00 cm/s  TV Vmean:       159.0 cm/s MV E/A ratio:  0.52         TV VTI:         0.58 msec                              SHUNTS                             Systemic VTI:  0.19 m                             Systemic Diam: 1.80 cm Dani Gobble Croitoru MD Electronically signed by Sanda Klein MD Signature Date/Time: 11/22/2021/10:29:33 AM    Final    VAS US CAROTID  Result Date: 11/22/2021 Carotid Arterial Duplex Study Patient Name:  Jeffrey [name] [facility]  Date of Exam:   11/22/2021 Medical Rec #: [id number]         Accession #:    [id number] Date of Birth: 12-26-1940         Patient Gender: M Patient Age:   78 years Exam Location:  [facility] Procedure:      VAS US CAROTID Referring Phys: Orma Flaming --------------------------------------------------------------------------------  Indications:       History of carotid stenosis. Risk Factors:      Hypertension, hyperlipidemia, past history of smoking. Other  Factors:     Dementia, multiple falls. Comparison Study:  09-08-2020 Most recent prior carotid duplex consistent with                    50-69% ICA stenosis bilaterally. Performing Technologist: Darlin Coco RDMS, RVT  Examination Guidelines: A  complete evaluation includes B-mode imaging, spectral Doppler, color Doppler, and power Doppler as needed of all accessible portions of each vessel. Bilateral testing is considered an integral part of a complete examination. Limited examinations for reoccurring indications may be performed as noted.  Right Carotid Findings: +----------+--------+--------+--------+------------------+---------------------+             PSV cm/s EDV cm/s Stenosis Plaque Description Comments               +----------+--------+--------+--------+------------------+---------------------+  CCA Prox   64       21                                   intimal thickening     +----------+--------+--------+--------+------------------+---------------------+  CCA Distal 50       17                hyperechoic,                                                                     heterogenous and                                                                 irregular                                 +----------+--------+--------+--------+------------------+---------------------+  ICA Prox   289      66       60-79%                                             +----------+--------+--------+--------+------------------+---------------------+  ICA Mid    287      49                                   post-stenotic                                                                    turbulence             +----------+--------+--------+--------+------------------+---------------------+  ICA Distal 54       16                                   tortuous               +----------+--------+--------+--------+------------------+---------------------+  ECA        72  22                                                           +----------+--------+--------+--------+------------------+---------------------+ +----------+--------+-------+----------------+-------------------+             PSV cm/s EDV cms Describe         Arm Pressure (mmHG)  +----------+--------+-------+----------------+-------------------+  Subclavian 155              Multiphasic, WNL                      +----------+--------+-------+----------------+-------------------+ +---------+--------+--+--------+--+---------+  Vertebral PSV cm/s 46 EDV cm/s 11 Antegrade  +---------+--------+--+--------+--+---------+  Left Carotid Findings: +----------+--------+--------+--------+-------------------+------------------+             PSV cm/s EDV cm/s Stenosis Plaque Description  Comments            +----------+--------+--------+--------+-------------------+------------------+  CCA Prox   67       15                                                        +----------+--------+--------+--------+-------------------+------------------+  CCA Distal 72       23                                    intimal thickening  +----------+--------+--------+--------+-------------------+------------------+  ICA Prox   67       21       1-39%    heterogenous , mild                     +----------+--------+--------+--------+-------------------+------------------+  ICA Mid    54       19                                                        +----------+--------+--------+--------+-------------------+------------------+  ICA Distal 58       22                                                        +----------+--------+--------+--------+-------------------+------------------+  ECA        154      27       >50%                                             +----------+--------+--------+--------+-------------------+------------------+ +----------+--------+--------+----------------+-------------------+             PSV cm/s EDV cm/s Describe         Arm Pressure (mmHG)   +----------+--------+--------+----------------+-------------------+  Subclavian 118  Multiphasic, WNL                      +----------+--------+--------+----------------+-------------------+ +---------+--------+--+--------+--+---------+  Vertebral PSV cm/s 40 EDV cm/s 11 Antegrade  +---------+--------+--+--------+--+---------+   Summary: Right Carotid: Velocities in the right ICA are consistent with a 60-79%                stenosis. Left Carotid: Velocities in the left ICA are consistent with a 1-39% stenosis.               The ECA appears >50% stenosed. Vertebrals:  Bilateral vertebral arteries demonstrate antegrade flow. Subclavians: Normal flow hemodynamics were seen in bilateral subclavian              arteries. *See table(s) above for measurements and observations.  Electronically signed by Harold Barban MD on 11/22/2021 at 9:42:31 PM.    Final     Assessment/Plan: CHI, doing well, no changes. CT head stable   LOS: 2 days    Ocie Cornfield [facility] 11/24/2021, 8:31 AM

## 2021-11-24 NOTE — NC FL2 (Signed)
[place] LEVEL OF CARE SCREENING TOOL     IDENTIFICATION  Patient Name: Jeffrey [name] Birthdate: 28-Apr-1941 Sex: male Admission Date (Current Location): 11/21/2021  [place] [place] [facility] and [place] Number:  Herbalist and Address:  [facility] [place], [place] [address], [place], [place] [address]      Provider Number: [id number]  Attending Physician Name and Address:  Shelly Coss, MD  Relative Name and Phone Number:       Current Level of Care: Hospital Recommended Level of Care: [place] Prior Approval Number:    Date Approved/Denied:   PASRR Number: [id number]  Discharge Plan: SNF    Current Diagnoses: Patient Active Problem List   Diagnosis Date Noted   Subdural hematoma 11/22/2021   Bilateral subdural hematomas 11/21/2021   Dysphagia 11/21/2021   Ectropion of right lower eyelid 11/21/2021   CKD (chronic kidney disease), stage III ([place]) 11/21/2021   Alzheimer's dementia ([place]) 05/25/2021   Apraxia on examination 05/25/2021   Frequent falls 05/25/2021   PSVT (paroxysmal supraventricular tachycardia) ([place]) 12/22/2020   SAH (subarachnoid hemorrhage) ([place]) 09/30/2020   possible syncope and collapse 02/17/2020   Bradycardia on ECG 02/17/2020   Hypersomnia with sleep apnea 02/17/2020   Carotid artery disease ([place]) 11/28/2016   Hyperkalemia 10/26/2014   HYPERGLYCEMIA 11/10/2008   LOCALIZED SUPERFICIAL SWELLING MASS OR LUMP 03/31/2008   MELANOMA OF SKIN, SITE UNSPECIFIED 02/11/2008   Hyperlipidemia 02/11/2008   Essential hypertension 02/11/2008   HYPERPLASIA PROSTATE UNS W/O UR OBST & OTH LUTS 02/11/2008   SKIN CANCER, HX OF 02/11/2008   GEN OSTEOARTHROSIS INVOLVING MULTIPLE SITES 10/03/2007   PES PLANUS 10/03/2007    Orientation RESPIRATION BLADDER Height & Weight     Self  Normal Indwelling catheter Weight: 170 lb 6.7 oz (77.3 kg) Height:  6' (182.9 cm)  BEHAVIORAL SYMPTOMS/MOOD NEUROLOGICAL BOWEL NUTRITION  STATUS      Continent Diet (see DC summary)  AMBULATORY STATUS COMMUNICATION OF NEEDS Skin   Limited Assist Verbally Surgical wounds (closed head wound, staples)                       Personal Care Assistance Level of Assistance  Bathing, Feeding, Dressing Bathing Assistance: Limited assistance Feeding assistance: Limited assistance Dressing Assistance: Limited assistance     Functional Limitations Info  Sight, Hearing Sight Info: Impaired Hearing Info: Impaired      SPECIAL CARE FACTORS FREQUENCY  PT (By licensed PT), OT (By licensed OT)     PT Frequency: 5x/wk OT Frequency: 5x/wk            Contractures Contractures Info: Not present    Additional Factors Info  Code Status, Allergies, Psychotropic Code Status Info: Partial Allergies Info: Donepezil, Other Psychotropic Info: Celexa 10mg  daily; Seroquel 12.5mg  2x/day PRN         Current Medications (11/24/2021):  This is the current hospital active medication list Current Facility-Administered Medications  Medication Dose Route Frequency Provider Last Rate Last Admin   0.9 %  sodium chloride infusion  250 mL Intravenous PRN Orma Flaming, MD       acetaminophen (TYLENOL) tablet 650 mg  650 mg Oral Q6H PRN Orma Flaming, MD       Or   acetaminophen (TYLENOL) suppository 650 mg  650 mg Rectal Q6H PRN Orma Flaming, MD       acetaminophen (TYLENOL) tablet 500 mg  500 mg Oral TID Regalado, Belkys A, MD   500 mg  at 11/24/21 1006   Chlorhexidine Gluconate Cloth 2 % PADS 6 each  6 each Topical Daily Regalado, Belkys A, MD   6 each at 11/24/21 1006   citalopram (CELEXA) tablet 10 mg  10 mg Oral Daily Orma Flaming, MD   10 mg at 11/24/21 1005   hydrALAZINE (APRESOLINE) injection 5 mg  5 mg Intravenous Q6H PRN Regalado, Belkys A, MD   5 mg at 11/23/21 0421   HYDROcodone-acetaminophen (NORCO/VICODIN) 5-325 MG per tablet 1 tablet  1 tablet Oral Q4H PRN Orma Flaming, MD       metoprolol tartrate (LOPRESSOR) tablet  12.5 mg  12.5 mg Oral BID Vernelle Emerald, MD   12.5 mg at 11/24/21 1006   morphine 2 MG/ML injection 0.5 mg  0.5 mg Intravenous Q4H PRN Regalado, Belkys A, MD       ofloxacin (OCUFLOX) 0.3 % ophthalmic solution 1 drop  1 drop Right Eye BID Orma Flaming, MD   1 drop at 11/24/21 1005   QUEtiapine (SEROQUEL) tablet 12.5 mg  12.5 mg Oral BID PRN Regalado, Belkys A, MD   12.5 mg at 11/22/21 2200   ramipril (ALTACE) capsule 2.5 mg  2.5 mg Oral Daily Orma Flaming, MD   2.5 mg at 11/24/21 1005   rivastigmine (EXELON) 4.6 mg/24hr 4.6 mg  4.6 mg Transdermal Daily Orma Flaming, MD   4.6 mg at 11/24/21 1005   sodium chloride flush (NS) 0.9 % injection 3 mL  3 mL Intravenous Q12H Orma Flaming, MD   3 mL at 11/24/21 1006   sodium chloride flush (NS) 0.9 % injection 3 mL  3 mL Intravenous PRN Orma Flaming, MD       tamsulosin [facility]) capsule 0.4 mg  0.4 mg Oral Daily Shelly Coss, MD   0.4 mg at 11/24/21 1006     Discharge Medications: Please see discharge summary for a list of discharge medications.  Relevant Imaging Results:  Relevant Lab Results:   Additional Information SS#: [id number]  Geralynn Ochs, LCSW

## 2021-11-24 NOTE — Progress Notes (Addendum)
HOSPITAL MEDICINE OVERNIGHT EVENT NOTE    Notified by nursing shortly after 9:45 PM the patient had suddenly developed a rapid wide-complex tachycardia.  Patient, despite being confused, denied any associated symptoms such as shortness of breath or chest pain.  Multiple twelve-lead EKGs were obtained and telemetry was reviewed.  Patient appears to have a left bundle branch block at baseline, obscuring interpretation.  Rhythm appeared to be in SVT at times with other times appearing to be more consistent with atrial fibrillation.    Patient was evaluated at the bedside and despite being quite confused and somewhat agitated was not in any distress.  BMP and magnesium were obtained revealing a magnesium of 1.8 and potassium of 3.6 for which 1 g of intravenous magnesium sulfate and 40 mill equivalents of oral potassium chloride were ordered.  2.5 mg of intravenous metoprolol was administered.  Patient appeared to convert to normal sinus rhythm at approximately 11:15 PM.  Placing patient on low-dose oral metoprolol of 12.5 mg BID.  We will continue to monitor on telemetry, and monitor for recurrence.  We will additionally discuss and review strips with cardiology on day shift.   Vernelle Emerald  MD [facility]   ADDENDUM (1/12 7:05AM)  Nursing has notified me that patient had greater than 400 cc in the bladder and was unable to urinate earlier in the earlier in the shift and underwent In-N-Out catheter.  Patient unfortunately has been experiencing ongoing mild hematuria ever since and is still unable to urinate as of 6:30 AM.  Once again greater than 400 cc in the bladder and patient is once again unable to void.  Due to ongoing hematuria with inability urinate I have asked that Foley catheter be placed.  If hematuria persists we will begin by performing intermittent flushing of the catheter and if this is unsuccessful urology consultation can be considered.  Sherryll Burger  Barbra Miner

## 2021-11-24 NOTE — Progress Notes (Signed)
°   11/24/21 0726  Provider Notification  Provider Name/Title Dr Tawanna Solo  Date Provider Notified 11/24/21  Time Provider Notified [phone]  Notification Type Page  Notification Reason Other (Comment) (Foley cath Insertion unsuccessful with 1 attempt)  Provider response See new orders  Date of Provider Response 11/24/21  Time of Provider Response [phone]

## 2021-11-24 NOTE — Progress Notes (Signed)
Assisted with transporting patient off unit via bed to Endoscopy unit for cardioversion. Bedside handoff given to [employer].  Wife remained at bedside in Endo unit.

## 2021-11-24 NOTE — Progress Notes (Signed)
Subjective:  Patient is pleasantly confused, his wife is present at the bedside.  He went into A. fib with RVR last night.  I was consulted to see him back again.  Patient himself has no complaints per se.  Intake/Output from previous day:  I/O last 3 completed shifts: In: 1186.7 [P.O.:420; I.V.:684; IV Piggyback:82.7] Out: 3099 [Urine:3099] Total I/O In: 3 [I.V.:3] Out: 600 [Urine:600]  Blood pressure (!) 160/90, pulse 85, temperature 98.5 F (36.9 C), temperature source Temporal, resp. rate (!) 25, height 6' (1.829 m), weight 77.3 kg, SpO2 100 %.  Physical Exam Eyes:     Extraocular Movements: Extraocular movements intact.  Cardiovascular:     Rate and Rhythm: Tachycardia present. Rhythm irregular.     Pulses: Normal pulses and intact distal pulses.     Heart sounds: No murmur heard.   No gallop. No S3 or S4 sounds.  Pulmonary:     Effort: Pulmonary effort is normal.     Breath sounds: Normal breath sounds.  Abdominal:     General: Bowel sounds are normal.     Palpations: Abdomen is soft.  Musculoskeletal:     Cervical back: Neck supple.     Right lower leg: No edema.     Left lower leg: No edema.  Neurological:     Mental Status: He is disoriented.    Lab Results: BMP BNP (last 3 results) No results for input(s): BNP in the last 8760 hours.  ProBNP (last 3 results) No results for input(s): PROBNP in the last 8760 hours. BMP Latest Ref Rng & Units 11/24/2021 11/23/2021 11/23/2021  Glucose 70 - 99 mg/dL 142(H) 152(H) 101(H)  BUN 8 - 23 mg/dL 15 15 15   Creatinine 0.61 - 1.24 mg/dL 0.94 1.04 1.00  Sodium 135 - 145 mmol/L 133(L) 132(L) 138  Potassium 3.5 - 5.1 mmol/L 3.6 3.6 4.2  Chloride 98 - 111 mmol/L 99 97(L) 106  CO2 22 - 32 mmol/L 24 24 23   Calcium 8.9 - 10.3 mg/dL 8.6(L) 8.5(L) 8.5(L)   Hepatic Function Latest Ref Rng & Units 03/05/2021 02/04/2008 08/21/2006  Total Protein 6.5 - 8.1 g/dL 6.3(L) - -  Albumin 3.5 - 5.0 g/dL 4.1 - -  AST 15 - 41 U/L 16 21 22   ALT  0 - 44 U/L 16 29 22   Alk Phosphatase 38 - 126 U/L 50 - -  Total Bilirubin 0.3 - 1.2 mg/dL 1.1 - -  Bilirubin, Direct 0.0 - 0.2 mg/dL 0.2 - -   CBC Latest Ref Rng & Units 11/24/2021 11/23/2021 11/22/2021  WBC 4.0 - 10.5 K/uL 12.5(H) 10.0 12.1(H)  Hemoglobin 13.0 - 17.0 g/dL 14.4 15.9 15.9  Hematocrit 39.0 - 52.0 % 43.4 48.4 48.7  Platelets 150 - 400 K/uL 168 181 199   Lipid Panel     Component Value Date/Time   CHOL 125 02/04/2008 0746   TRIG 28 02/04/2008 0746   TRIG 19 08/21/2006 0807   HDL 42.4 02/04/2008 0746   CHOLHDL 2.9 CALC 02/04/2008 0746   VLDL 6 02/04/2008 0746   LDLCALC 77 02/04/2008 0746   Cardiac Panel (last 3 results) Recent Labs    11/21/21 2051  CKTOTAL 318    HEMOGLOBIN A1C Lab Results  Component Value Date   HGBA1C 5.2 11/10/2008   TSH Recent Labs    11/21/21 2051  TSH 0.476   Imaging:  CT of the head without contrast 11/21/2021: 1. Small acute bilateral subdural hematomas without mass effect. 2. Small volume acute hemorrhage in the  fourth ventricle. 3. Small hyperdensity along the posterolateral aspect of the third ventricle on the right, likely additional extra-axial hemorrhage although deep cerebral vein thrombosis is an alternative consideration. 4. Moderate chronic small vessel ischemic disease. 5. No acute cervical spine fracture.  Cardiac Studies:  Lexiscan Myoview Stress Test 08/25/2019: 1. Resting EKG normal sinus rhythm, left axis deviation, poor R progression, IVCD.  Stress EKG nondiagnostic due to Lexiscan infusion. Hypertensive both in rest and stress with resting  BP 160/100 mm Hg.  2. Perfusion imaging study reveals the left ventricle to be normal in both rest and stress images.  There is a moderate sized mild ischemia noted in inferior and infero-apical wall extending from the base towards the apex.  Left ventricular systolic function was preserved at 53% without wall motion abnormality. This represents a low risk study in view of very  minimal ischemia and normal wall motion. No previous exam available for comparison.   Echocardiogram 11/22/2020: :   1. Left ventricular ejection fraction, by estimation, is 55 to 60%. The left ventricle has normal function. Left ventricular endocardial border not optimally defined to evaluate regional wall motion. There may be hypokinesis of the apical segments of  the inferior wall and inferior septum. Left ventricular diastolic parameters are consistent with Grade I diastolic dysfunction (impaired relaxation).  2. Right ventricular systolic function is normal. The right ventricular size is normal.  3. The mitral valve is normal in structure. No evidence of mitral valve regurgitation. No evidence of mitral stenosis.  4. The aortic valve is normal in structure. Aortic valve regurgitation is not visualized. No aortic stenosis is present.  5. The inferior vena cava is normal in size with greater than 50% respiratory variability, suggesting right atrial pressure of 3 mmHg.  EKG:    EKG 11/24/2021: Normal sinus rhythm at the rate of 74 bpm, left axis deviation, left anterior fascicular block.  Poor R wave progression, cannot exclude anteroseptal infarct old.  No evidence of ischemia, normal QT interval.  Compared to EKG done 11/23/2021, A. fib with RVR not present.  Telemetry 11/24/2022: Atrial flutter with 2: 1 conduction.  Scheduled Meds:  [MAR Hold] acetaminophen  500 mg Oral TID   [MAR Hold] Chlorhexidine Gluconate Cloth  6 each Topical Daily   [MAR Hold] citalopram  10 mg Oral Daily   [MAR Hold] metoprolol succinate  50 mg Oral Daily   [MAR Hold] ofloxacin  1 drop Right Eye BID   [MAR Hold] ramipril  2.5 mg Oral Daily   [MAR Hold] rivastigmine  4.6 mg Transdermal Daily   [MAR Hold] sodium chloride flush  3 mL Intravenous Q12H   [MAR Hold] tamsulosin  0.4 mg Oral Daily   Continuous Infusions:  [MAR Hold] sodium chloride     PRN Meds:.[MAR Hold] sodium chloride, [MAR Hold] acetaminophen  **OR** [MAR Hold] acetaminophen, [MAR Hold] hydrALAZINE, [MAR Hold] HYDROcodone-acetaminophen, [MAR Hold]  morphine injection, [MAR Hold] QUEtiapine, [MAR Hold] sodium chloride flush  Assessment/Plan:   1.  New onset A. fib with RVR, not a candidate for anticoagulation obviously in view of cerebral hemorrhage and frequent falls 2.  Hypertension 3.  Severe dementia 4.  DNR status 5.  Primary hypertension  Recommendation: Patient was seen by me at 8 AM this morning.  He was seen atypical a flutter with RVR, I felt that best option would be to proceed with direct-current cardioversion as he has never had A. fib in the past.  He is also not a candidate for anticoagulation  and A. fib is <12 hours.  I discussed setting up cardioversion with patient's wife at the bedside, discussed the risks and benefits and she is in agreement.  I have also been in touch with patient's daughter Fulton Mole, [place].  Fortunately upon presentation this afternoon to the endoscopy, patient is back in sinus rhythm.  He was previously on 50 mg of metoprolol succinate, I will restart this and discontinue metoprolol tartrate 12.5 mg twice daily.  Otherwise stable from cardiac standpoint.     Adrian Prows, MD, [facility] 11/24/2021, 12:39 PM Office: [phone] Fax: [phone] Pager: [phone]

## 2021-11-24 NOTE — Progress Notes (Signed)
PROGRESS NOTE    KIANDRE [name]  JEH:[id number] DOB: Apr 11, 1941 DOA: 11/21/2021 PCP: Larey Seat, MD   Chief Complain: Fall  Brief Narrative: Patient is a 81 year old male with history of dementia, frequent falls, hypertension, hyperlipidemia, OSA on CPAP, CKD stage IIIa, history of skin cancer, subarachnoid hemorrhage in 2021 due to fall, carotid artery disease who presents to the emergency department after fall at independent living facility.  He was found by his wife on the ground.  CT head showed small acute bilateral subdural hematoma without mass-effect, small volume acute hemorrhage in the fourth ventricle.  Neurosurgery was consulted ,there is no plan for intervention.  Hospitalist remarkable for persistent altered mental status worse than his baseline ,urinary retention,new onset Afib.  PT/OT reevaluation done, recommended skilled nursing facility on discharge.  Assessment & Plan:   Active Problems:   Hyperlipidemia   Essential hypertension   possible syncope and collapse   Hypersomnia with sleep apnea   Alzheimer's dementia (HCC)   Frequent falls   Bilateral subdural hematomas   Hyperkalemia   Dysphagia   Ectropion of right lower eyelid   CKD (chronic kidney disease), stage III (HCC)   Subdural hematoma   Bilateral subdural hematoma: Presented after a fall.  CT showed bilateral acute subdural hematoma.  Repeat CT done on 1/10 showed lobulated intracerebral hematoma in the posterior left temporal lobe, interval increase in the amount of blood in the third and both lateral ventricles.  Possible subarachnoid hemorrhage.  Case was discussed with neurosurgery, no operative intervention recommended, recommended supportive care.   CT head done on 1/11 showed mild enlargement of the left temporal lobe hematoma,unchanged small subdural hematomas and minimal subarachnoid Hemorrhage,decreased intraventricular hemorrhage Patient remains confused today.  We are repeating CT head for  follow-up  Acute encephalopathy/dementia: Has history of dementia and is intermittently confused.  More confused at present.  Multifactorial secondary to subdural hematoma/contusion/delirium.  Continue delirium precautions, frequent reorientation.   On Seroquel as needed. On rivastigmine.  Elevated troponin: Likely from supply demand ischemia, less likely from ACS.  Cardiology was consulted.  No further work-up recommended.  New onset A. fib: Happened last night.  Currently in normal sinus rhythm.  Monitor on telemetry.  Currently rate is controlled.  Not a candidate for anticoagulant due to intracerebral bleed.  Cardiology doing cardioversion today  Hypertension: Currently on ramipril.  Monitor blood pressure.  Will increase the dose of ramipril if he becomes hypertensive.  Sleep apnea: On CPAP at home  CKD stage IIIa: Currently kidney function stable  Urine retention:   Started on tamsulosin.  Patient failed voiding trial.  New Foley will be placed.  He needs to follow-up with urology as an outpatient.  Debility/deconditioning: Patient resides at independent living facility.  PT/OT recommended skilled nursing facility on discharge. Partial code          DVT prophylaxis:SCD Code Status: Full Family Communication: Wife at bedside Patient status:Inpatient  Dispo: The patient is from: ILF              Anticipated d/c is to: SNF              Anticipated d/c date is: in 1-2 days  Consultants: Neurosurgery, cardiology  Procedures: None  Antimicrobials:  Anti-infectives (From admission, onward)    None       Subjective: Patient seen and examined at the bedside this morning.  Hemodynamically stable.  Was in A. fib with RVR last night, currently in normal sinus rhythm.  Retained  urine so Foley was placed which was traumatic.  Now has a new Foley.  Wife at the bedside.  Patient lying in bed without any distress, confused  Objective: Vitals:   11/23/21 2247 11/23/21 2335  11/24/21 0050 11/24/21 0309  BP: (!) 199/107 (!) 146/95 113/71 (!) 144/78  Pulse: (!) 112 97 84 72  Resp:  (!) 23 20 (!) 23  Temp:  98.2 F (36.8 C) 98.7 F (37.1 C) 99.4 F (37.4 C)  TempSrc:  Axillary Axillary Oral  SpO2:  91% 93% 96%  Weight:      Height:        Intake/Output Summary (Last 24 hours) at 11/24/2021 0816 Last data filed at 11/24/2021 0348 Gross per 24 hour  Intake 448.68 ml  Output 1599 ml  Net -1150.32 ml   Filed Weights   11/22/21 0759  Weight: 77.3 kg    Examination:   General exam: Very deconditioned, chronically ill looking, frail HEENT:  flap on the right face from previous surgery Respiratory system:  no wheezes or crackles  Cardiovascular system: S1 & S2 heard, RRR.  Gastrointestinal system: Abdomen is nondistended, soft and nontender. Central nervous system: Awake but not oriented Extremities: No edema, no clubbing ,no cyanosis Skin: No rashes, no ulcers,no icterus   GU: Foley    Data Reviewed: I have personally reviewed following labs and imaging studies  CBC: Recent Labs  Lab 11/21/21 1050 11/22/21 1402 11/23/21 0349 11/24/21 0203  WBC 11.1* 12.1* 10.0 12.5*  NEUTROABS 9.1*  --   --  10.2*  HGB 17.7* 15.9 15.9 14.4  HCT 54.0* 48.7 48.4 43.4  MCV 93.3 90.9 92.0 90.4  PLT 198 199 181 194   Basic Metabolic Panel: Recent Labs  Lab 11/21/21 2051 11/22/21 1402 11/23/21 0349 11/23/21 2328 11/24/21 0203  NA 134* 137 138 132* 133*  K 3.8 3.9 4.2 3.6 3.6  CL 100 102 106 97* 99  CO2 22 24 23 24 24   GLUCOSE 168* 111* 101* 152* 142*  BUN 14 17 15 15 15   CREATININE 1.11 1.08 1.00 1.04 0.94  CALCIUM 9.3 8.9 8.5* 8.5* 8.6*  MG  --   --   --  1.8  --    GFR: Estimated Creatinine Clearance: 68.5 mL/min (by C-G formula based on SCr of 0.94 mg/dL). Liver Function Tests: No results for input(s): AST, ALT, ALKPHOS, BILITOT, PROT, ALBUMIN in the last 168 hours. No results for input(s): LIPASE, AMYLASE in the last 168 hours. Recent  Labs  Lab 11/22/21 0936  AMMONIA 29   Coagulation Profile: Recent Labs  Lab 11/22/21 1402  INR 1.1   Cardiac Enzymes: Recent Labs  Lab 11/21/21 2051  CKTOTAL 318   BNP (last 3 results) No results for input(s): PROBNP in the last 8760 hours. HbA1C: No results for input(s): HGBA1C in the last 72 hours. CBG: Recent Labs  Lab 11/21/21 1037 11/22/21 0823  GLUCAP 124* 144*   Lipid Profile: No results for input(s): CHOL, HDL, LDLCALC, TRIG, CHOLHDL, LDLDIRECT in the last 72 hours. Thyroid Function Tests: Recent Labs    11/21/21 2051  TSH 0.476   Anemia Panel: Recent Labs    11/22/21 0936  VITAMINB12 253   Sepsis Labs: No results for input(s): PROCALCITON, LATICACIDVEN in the last 168 hours.  No results found for this or any previous visit (from the past 240 hour(s)).       Radiology Studies: CT HEAD WO CONTRAST (5MM)  Result Date: 11/23/2021 CLINICAL DATA:  Stroke follow-up.  EXAM: CT HEAD WITHOUT CONTRAST TECHNIQUE: Contiguous axial images were obtained from the base of the skull through the vertex without intravenous contrast. RADIATION DOSE REDUCTION: This exam was performed according to the departmental dose-optimization program which includes automated exposure control, adjustment of the mA and/or kV according to patient size and/or use of iterative reconstruction technique. COMPARISON:  CT head 11/22/2021 FINDINGS: Brain: A parenchymal hematoma inferiorly in the posterior left temporal lobe has mildly increased in size, measuring 3.0 x 5.9 x 2.4 cm (estimated volume of 21 mL). Mild surrounding edema is unchanged, as is mass effect on the temporal horn of the left lateral ventricle. There is trace hemorrhage in the occipital horns of the lateral ventricles which has decreased. Minimal hemorrhage along the posterior aspect of the third ventricle/medial margin of the right thalamus is unchanged. Minimal subarachnoid hemorrhage near the posterior aspect of the left  sylvian fissure is unchanged. Small subdural hematomas over the cerebral convexities are unchanged and measure up to 8 mm in thickness on the right and 3 mm on the left. A small amount of subdural blood along the tentorium bilaterally is also unchanged. There is no significant midline shift. There is no evidence of an acute infarct, new hemorrhage, or hydrocephalus. Patchy hypodensities in the cerebral white matter bilaterally are unchanged and nonspecific but compatible with moderate chronic small vessel ischemic disease. There is mild cerebral atrophy. Vascular: Calcified atherosclerosis at the skull base. Skull: No acute fracture or suspicious osseous lesion. Sinuses/Orbits: Unchanged large right mastoid effusion. Clear paranasal sinuses. Bilateral cataract extraction. Right eyelid weight. Other: Posterior scalp skin staples with mild swelling/contusion. Postoperative changes in the right parotid region. IMPRESSION: 1. Mild enlargement of the left temporal lobe hematoma. Unchanged mild edema. 2. Unchanged small subdural hematomas and minimal subarachnoid hemorrhage. 3. Decreased intraventricular hemorrhage. Electronically Signed   By: Logan Bores M.D.   On: 11/23/2021 13:31   CT HEAD WO CONTRAST (5MM)  Result Date: 11/22/2021 CLINICAL DATA:  Intracranial bleeding, trauma EXAM: CT HEAD WITHOUT CONTRAST TECHNIQUE: Contiguous axial images were obtained from the base of the skull through the vertex without intravenous contrast. COMPARISON:  11/21/2021 FINDINGS: Brain: There is interval appearance of large lobulated intra cerebral hematoma measuring a proximally 4.7 x 2.9 x 2.8 cm in the left posterior temporal lobe. Estimated volume is approximately 19 mL. There is surrounding edema. There is 6 mm area of bleeding in the right posterior third ventricle. There is interval layering of small amount of blood in the dependent portions of occipital lobes in both lateral ventricles. Small 3 mm subdural hematoma seen in  the periphery of left cerebral hemisphere is less evident. There is increase in amount of CSF density adjacent to both frontal lobes, more so on the right side measuring up to 10 mm in the diameter. There is no shift of midline structures. There is increased density between the leaves tentorium with interval worsening more so on the left side. There is subtle increased density in the posterior right sylvian fissure, possibly presence of subarachnoid hemorrhage. Similar finding to a lesser extent is seen in the posterior left sylvian fissure. Vascular: Scattered arterial calcifications are seen. Skull: No displaced fracture is seen. Sinuses/Orbits: Sinuses are unremarkable. There is a metallic density in the anterior right orbit. IMPRESSION: There is interval appearance of 4.7 x 2.9 x 2.8 cm lobulated intra cerebral hematoma in the posterior left temporal lobe. There is interval increase in the amount of blood in the third and both lateral ventricles. There  is increased amount of blood between the leaves of the tentorium, more so on the left side. There is subtle increased density in the posterior aspects of sylvian fissure on both sides, more so on the right side possibly suggesting subarachnoid hemorrhage. 3 mm acute left subdural hematoma seen in the previous examination appears less prominent. There is increased amount of CSF density in the periphery of both frontal lobes, more so on the right side measuring up to 10 mm in thickness without definite evidence of recent bleeding. Imaging findings were relayed to Dr. Tyrell Antonio by telephone call. She will contact neurosurgeon. Electronically Signed   By: Elmer Picker M.D.   On: 11/22/2021 11:41   ECHOCARDIOGRAM COMPLETE  Result Date: 11/22/2021    ECHOCARDIOGRAM REPORT   Patient Name:   NESTER BACHUS [facility] Date of Exam: 11/22/2021 Medical Rec #:  [id number]        Height:       72.0 in Accession #:    [id number]       Weight:       170.5 lb Date of Birth:   12/26/1940        BSA:          1.991 m Patient Age:    45 years         BP:           107/67 mmHg Patient Gender: M                HR:           82 bpm. Exam Location:  Inpatient Procedure: 2D Echo, Cardiac Doppler and Color Doppler Indications:    syncope  History:        Patient has prior history of Echocardiogram examinations, most                 recent 08/29/2019. Risk Factors:Hypertension. Hyperlipidemia.  Sonographer:    Beryle Beams Referring Phys: [id number] [place]  1. Left ventricular ejection fraction, by estimation, is 55 to 60%. The left ventricle has normal function. Left ventricular endocardial border not optimally defined to evaluate regional wall motion. There may be hypokinesis of the apical segments of the inferior wall and inferior septum. Left ventricular diastolic parameters are consistent with Grade I diastolic dysfunction (impaired relaxation).  2. Right ventricular systolic function is normal. The right ventricular size is normal.  3. The mitral valve is normal in structure. No evidence of mitral valve regurgitation. No evidence of mitral stenosis.  4. The aortic valve is normal in structure. Aortic valve regurgitation is not visualized. No aortic stenosis is present.  5. The inferior vena cava is normal in size with greater than 50% respiratory variability, suggesting right atrial pressure of 3 mmHg. FINDINGS  Left Ventricle: Left ventricular ejection fraction, by estimation, is 55 to 60%. The left ventricle has normal function. Left ventricular endocardial border not optimally defined to evaluate regional wall motion. The left ventricular internal cavity size was normal in size. There is no left ventricular hypertrophy. Left ventricular diastolic parameters are consistent with Grade I diastolic dysfunction (impaired relaxation). Indeterminate filling pressures. Right Ventricle: Ivc not seen. The right ventricular size is normal. No increase in right ventricular wall  thickness. Right ventricular systolic function is normal. Left Atrium: Left atrial size was normal in size. Right Atrium: Right atrial size was normal in size. Pericardium: Trivial pericardial effusion is present. Mitral Valve: The mitral valve is normal in structure. No evidence of mitral valve regurgitation.  No evidence of mitral valve stenosis. Tricuspid Valve: The tricuspid valve is normal in structure. Tricuspid valve regurgitation is not demonstrated. No evidence of tricuspid stenosis. Aortic Valve: The aortic valve is normal in structure. Aortic valve regurgitation is not visualized. No aortic stenosis is present. Aortic valve mean gradient measures 3.0 mmHg. Aortic valve peak gradient measures 5.1 mmHg. Aortic valve area, by VTI measures 2.33 cm. Pulmonic Valve: The pulmonic valve was normal in structure. Pulmonic valve regurgitation is not visualized. No evidence of pulmonic stenosis. Aorta: The aortic root is normal in size and structure. Venous: The inferior vena cava is normal in size with greater than 50% respiratory variability, suggesting right atrial pressure of 3 mmHg. IAS/Shunts: No atrial level shunt detected by color flow Doppler.  LEFT VENTRICLE PLAX 2D LVIDd:         4.80 cm     Diastology LVIDs:         2.80 cm     LV e' medial:    4.46 cm/s LV PW:         1.10 cm     LV E/e' medial:  12.1 LV IVS:        0.70 cm     LV e' lateral:   4.46 cm/s LVOT diam:     1.80 cm     LV E/e' lateral: 12.1 LV SV:         48 LV SV Index:   24 LVOT Area:     2.54 cm  LV Volumes (MOD) LV vol d, MOD A2C: 35.3 ml LV vol d, MOD A4C: 42.3 ml LV vol s, MOD A2C: 13.3 ml LV vol s, MOD A4C: 19.1 ml LV SV MOD A2C:     22.0 ml LV SV MOD A4C:     42.3 ml LV SV MOD BP:      24.4 ml RIGHT VENTRICLE RV S prime:     9.36 cm/s TAPSE (M-mode): 2.0 cm LEFT ATRIUM           Index        RIGHT ATRIUM          Index LA diam:      2.70 cm 1.36 cm/m   RA Area:     6.72 cm LA Vol (A2C): 33.8 ml 16.98 ml/m  RA Volume:   9.86 ml   4.95 ml/m LA Vol (A4C): 21.7 ml 10.90 ml/m  AORTIC VALVE                    PULMONIC VALVE AV Area (Vmax):    2.32 cm     PV Vmax:       0.88 m/s AV Area (Vmean):   2.60 cm     PV Peak grad:  3.1 mmHg AV Area (VTI):     2.33 cm AV Vmax:           113.00 cm/s AV Vmean:          75.100 cm/s AV VTI:            0.205 m AV Peak Grad:      5.1 mmHg AV Mean Grad:      3.0 mmHg LVOT Vmax:         103.00 cm/s LVOT Vmean:        76.700 cm/s LVOT VTI:          0.188 m LVOT/AV VTI ratio: 0.92  AORTA Ao Root diam: 2.60 cm Ao Asc diam:  2.50  cm MITRAL VALVE                TRICUSPID VALVE MV Area (PHT): 5.54 cm     TV Peak grad:   13.4 mmHg MV Decel Time: 137 msec     TV Mean grad:   11.0 mmHg MV E velocity: 53.90 cm/s   TV Vmax:        1.83 m/s MV A velocity: 103.00 cm/s  TV Vmean:       159.0 cm/s MV E/A ratio:  0.52         TV VTI:         0.58 msec                              SHUNTS                             Systemic VTI:  0.19 m                             Systemic Diam: 1.80 cm Sanda Klein MD Electronically signed by Sanda Klein MD Signature Date/Time: 11/22/2021/10:29:33 AM    Final    VAS US CAROTID  Result Date: 11/22/2021 Carotid Arterial Duplex Study Patient Name:  DEJAUN VIDRIO [facility]  Date of Exam:   11/22/2021 Medical Rec #: [id number]         Accession #:    [id number] Date of Birth: [birth year]         Patient Gender: M Patient Age:   32 years Exam Location:  [facility] Procedure:      VAS US CAROTID Referring Phys: Orma Flaming --------------------------------------------------------------------------------  Indications:       History of carotid stenosis. Risk Factors:      Hypertension, hyperlipidemia, past history of smoking. Other Factors:     Dementia, multiple falls. Comparison Study:  09-08-2020 Most recent prior carotid duplex consistent with                    50-69% ICA stenosis bilaterally. Performing Technologist: Darlin Coco RDMS, RVT  Examination Guidelines: A complete evaluation  includes B-mode imaging, spectral Doppler, color Doppler, and power Doppler as needed of all accessible portions of each vessel. Bilateral testing is considered an integral part of a complete examination. Limited examinations for reoccurring indications may be performed as noted.  Right Carotid Findings: +----------+--------+--------+--------+------------------+---------------------+             PSV cm/s EDV cm/s Stenosis Plaque Description Comments               +----------+--------+--------+--------+------------------+---------------------+  CCA Prox   64       21                                   intimal thickening     +----------+--------+--------+--------+------------------+---------------------+  CCA Distal 50       17                hyperechoic,  heterogenous and                                                                 irregular                                 +----------+--------+--------+--------+------------------+---------------------+  ICA Prox   289      66       60-79%                                             +----------+--------+--------+--------+------------------+---------------------+  ICA Mid    287      49                                   post-stenotic                                                                    turbulence             +----------+--------+--------+--------+------------------+---------------------+  ICA Distal 54       16                                   tortuous               +----------+--------+--------+--------+------------------+---------------------+  ECA        72       22                                                          +----------+--------+--------+--------+------------------+---------------------+ +----------+--------+-------+----------------+-------------------+             PSV cm/s EDV cms Describe         Arm Pressure (mmHG)   +----------+--------+-------+----------------+-------------------+  Subclavian 155              Multiphasic, WNL                      +----------+--------+-------+----------------+-------------------+ +---------+--------+--+--------+--+---------+  Vertebral PSV cm/s 46 EDV cm/s 11 Antegrade  +---------+--------+--+--------+--+---------+  Left Carotid Findings: +----------+--------+--------+--------+-------------------+------------------+             PSV cm/s EDV cm/s Stenosis Plaque Description  Comments            +----------+--------+--------+--------+-------------------+------------------+  CCA Prox   67       15                                                        +----------+--------+--------+--------+-------------------+------------------+  CCA Distal 72       23                                    intimal thickening  +----------+--------+--------+--------+-------------------+------------------+  ICA Prox   67       21       1-39%    heterogenous , mild                     +----------+--------+--------+--------+-------------------+------------------+  ICA Mid    54       19                                                        +----------+--------+--------+--------+-------------------+------------------+  ICA Distal 58       22                                                        +----------+--------+--------+--------+-------------------+------------------+  ECA        154      27       >50%                                             +----------+--------+--------+--------+-------------------+------------------+ +----------+--------+--------+----------------+-------------------+             PSV cm/s EDV cm/s Describe         Arm Pressure (mmHG)  +----------+--------+--------+----------------+-------------------+  Subclavian 118               Multiphasic, WNL                      +----------+--------+--------+----------------+-------------------+ +---------+--------+--+--------+--+---------+  Vertebral PSV  cm/s 40 EDV cm/s 11 Antegrade  +---------+--------+--+--------+--+---------+   Summary: Right Carotid: Velocities in the right ICA are consistent with a 60-79%                stenosis. Left Carotid: Velocities in the left ICA are consistent with a 1-39% stenosis.               The ECA appears >50% stenosed. Vertebrals:  Bilateral vertebral arteries demonstrate antegrade flow. Subclavians: Normal flow hemodynamics were seen in bilateral subclavian              arteries. *See table(s) above for measurements and observations.  Electronically signed by Harold Barban MD on 11/22/2021 at 9:42:31 PM.    Final         Scheduled Meds:  acetaminophen  500 mg Oral TID   Chlorhexidine Gluconate Cloth  6 each Topical Daily   citalopram  10 mg Oral Daily   lidocaine  1 application Urethral Once   metoprolol tartrate  12.5 mg Oral BID   ofloxacin  1 drop Right Eye BID   ramipril  2.5 mg Oral Daily   rivastigmine  4.6 mg Transdermal Daily   sodium chloride flush  3 mL Intravenous Q12H   tamsulosin  0.4 mg Oral Daily  Continuous Infusions:  sodium chloride       LOS: 2 days         Shelly Coss, MD [facility] P1/10/2022, 8:16 AM

## 2021-11-24 NOTE — Progress Notes (Signed)
Patient remained confused during duration of this shift. Coude foley catheter placed this morning with noticeable improvement in patient comfort and vitals signs. Patient went off unit for attempted cardioversion, found to be in Sinus Rhythm, returned to unit in no distress. Patient's wife was sitting and redirecting this patient during the 7a-3p hours.  Safety sitter came to bedside at 3pm and wife left to rest.  Patient is scheduled for repeat CT of head today, called CT to coordinate patient's timing, but they were unable to accomodate during this shift. Plan is for patient to go to CT during 7p-7a shift. Safety Sitter remains at bedside at this time.

## 2021-11-25 LAB — RESP PANEL BY RT-PCR (FLU A&B, COVID) ARPGX2
Influenza A by PCR: NEGATIVE
Influenza B by PCR: NEGATIVE
SARS Coronavirus 2 by RT PCR: NEGATIVE

## 2021-11-25 MED ORDER — METOPROLOL SUCCINATE ER 50 MG PO TB24: 50.0000 mg | ORAL_TABLET | Freq: Every day | ORAL | 1 refills | Status: AC

## 2021-11-25 MED ORDER — TAMSULOSIN HCL 0.4 MG PO CAPS: 0.4000 mg | ORAL_CAPSULE | Freq: Every day | ORAL | 1 refills | Status: AC

## 2021-11-25 NOTE — Progress Notes (Signed)
Patient refused CPAP HS. Patient states he does not wear a CPAP when he sleeps at night.

## 2021-11-25 NOTE — Plan of Care (Signed)
°  Problem: Education: Goal: Knowledge of General Education information will improve Description: Including pain rating scale, medication(s)/side effects and non-pharmacologic comfort measures Outcome: Adequate for Discharge   Problem: Health Behavior/Discharge Planning: Goal: Ability to manage health-related needs will improve Outcome: Adequate for Discharge   Problem: Clinical Measurements: Goal: Ability to maintain clinical measurements within normal limits will improve Outcome: Adequate for Discharge Goal: Will remain free from infection Outcome: Adequate for Discharge Goal: Respiratory complications will improve Outcome: Adequate for Discharge   Problem: Activity: Goal: Risk for activity intolerance will decrease Outcome: Adequate for Discharge   Problem: Coping: Goal: Level of anxiety will decrease Outcome: Adequate for Discharge   Problem: Elimination: Goal: Will not experience complications related to bowel motility Outcome: Adequate for Discharge Goal: Will not experience complications related to urinary retention Outcome: Adequate for Discharge   Problem: Pain Managment: Goal: General experience of comfort will improve Outcome: Adequate for Discharge   Problem: Safety: Goal: Ability to remain free from injury will improve Outcome: Adequate for Discharge   Problem: Skin Integrity: Goal: Risk for impaired skin integrity will decrease Outcome: Adequate for Discharge

## 2021-11-25 NOTE — Progress Notes (Signed)
Report called to [facility] pt ready for transport

## 2021-11-25 NOTE — TOC Transition Note (Signed)
Transition of Care [facility]) - CM/SW Discharge Note   Patient Details  Name: Jeffrey [name] MRN: [id number] Date of Birth: 1941/02/06  [facility] CM/SW Contact:  Coralee Pesa, [place] Phone Number: 11/25/2021, 11:47 AM   Clinical Narrative:     Pt to be transported to [facility] via [facility].  Nurse to call report to [phone]. Rm 153, Nurse is Abigail Butts.  Final next level of care: Skilled Nursing Facility Barriers to Discharge: Barriers Resolved   Patient Goals and CMS Choice Patient states their goals for this hospitalization and ongoing recovery are:: patient unable to participate in goal setting, only oriented to self CMS Medicare.gov Compare Post Acute Care list provided to:: Patient Represenative (must comment) Choice offered to / list presented to : Spouse  Discharge Placement              Patient chooses bed at:  [facility]) Patient to be transferred to facility by: [place] Name of family member notified: Clarise Cruz Patient and family notified of of transfer: 11/25/21  Discharge Plan and Services     Post Acute Care Choice: [place]                               Social Determinants of Health (SDOH) Interventions     Readmission Risk Interventions No flowsheet data found.

## 2021-11-25 NOTE — Discharge Summary (Signed)
Physician Discharge Summary  LAMAR METER ZOX:[id number] DOB: 05/06/1941 DOA: 11/21/2021  PCP: Larey Seat, MD  Admit date: 11/21/2021 Discharge date: 11/25/2021  Admitted From: ILF Disposition:  SNF  Discharge Condition:Stable CODE STATUS:Partial Diet recommendation: Heart Healthy   Brief/Interim Summary:  Patient is a 81 year old male with history of dementia, frequent falls, hypertension, hyperlipidemia, OSA on CPAP, CKD stage IIIa, history of skin cancer, subarachnoid hemorrhage in 2021 due to fall, carotid artery disease who presents to the emergency department after fall at independent living facility.  He was found by his wife on the ground.  CT head showed small acute bilateral subdural hematoma without mass-effect, small volume acute hemorrhage in the fourth ventricle.  Neurosurgery was consulted ,there is no plan for intervention.  Hospitalist remarkable for persistent altered mental status worse than his baseline ,urinary retention,new onset Afib.  PT/OT reevaluation done, recommended skilled nursing facility on discharge.  He is hemodynamically stable for discharge to skilled nursing facility today.  Follow problems were addressed during his hospitalization:  Bilateral subdural hematoma: Presented after a fall.  CT showed bilateral acute subdural hematoma.  Repeat CT done on 1/10 showed lobulated intracerebral hematoma in the posterior left temporal lobe, interval increase in the amount of blood in the third and both lateral ventricles.  Possible subarachnoid hemorrhage. Case was discussed with neurosurgery, no operative intervention recommended, recommended supportive care.   CT head done on 1/11 showed mild enlargement of the left temporal lobe hematoma,unchanged small subdural hematomas and minimal subarachnoid Hemorrhage,decreased intraventricular hemorrhage Patient remains confused today but mental status is close to baseline.  We recommend to follow-up with neurosurgery as  an outpatient  Acute encephalopathy/dementia: Has history of dementia and is intermittently confused.   Multifactorial secondary to subdural hematoma/contusion/delirium.  Continue delirium precautions, frequent reorientation.  Takes rivastigmine at home.   Elevated troponin: Likely from supply demand ischemia, less likely from ACS.  Cardiology was consulted.  No further work-up recommended.   New onset A. fib: Happened last night.  Currently in normal sinus rhythm.  Monitor on telemetry.  Currently rate is controlled.  Not a candidate for anticoagulant due to intracerebral bleed.  Cardiology doing cardioversion today    New onset A. fib: Happened on night of 11/23/21.  Currently in normal sinus rhythm.  Monitor on telemetry.  Currently rate is controlled.  Not a candidate for anticoagulant due to intracerebral bleed.  Continue metoprolol   Sleep apnea: On CPAP at home   CKD stage IIIa: Currently kidney function stable   Urine retention:   Started on tamsulosin.  Patient failed voiding trial.  New Foley will be placed.  He needs to follow-up with urology as an outpatient.   Debility/deconditioning: Patient resides at independent living facility.  PT/OT recommended skilled nursing facility on discharge. Partial code.  Goals of care was discussed with family, they are not ready for transitioning his care to hospice/palliative       Discharge Diagnoses:  Active Problems:   Hyperlipidemia   Essential hypertension   possible syncope and collapse   Hypersomnia with sleep apnea   Alzheimer's dementia (HCC)   Frequent falls   Bilateral subdural hematomas   Hyperkalemia   Dysphagia   Ectropion of right lower eyelid   CKD (chronic kidney disease), stage III ([place])   Subdural hematoma    Discharge Instructions  Discharge Instructions     Diet - low sodium heart healthy   Complete by: As directed    Discharge instructions   Complete by:  As directed    1)Please take prescribed  medications as instructed 2)Follow up with urology in a week.  Name and number of the provider group has been attached 3)Follow up with neurosurgery in 2 to 3 weeks.  Name and number of the provider has been attached.   Increase activity slowly   Complete by: As directed    No wound care   Complete by: As directed       Allergies as of 11/25/2021       Reactions   Donepezil Other (See Comments)   Per wife "has not done well."   Other Other (See Comments)   Vicryl sutures, pte. Doesn't know what reaction he had, "Drs. Owens Shark + Yeatts deetrmined not to use it on me"        Medication List     TAKE these medications    atorvastatin 20 MG tablet Commonly known as: LIPITOR Take 20 mg by mouth daily at 6 PM.   citalopram 10 MG tablet Commonly known as: CELEXA Take 1 tablet (10 mg total) by mouth daily.   ketoconazole 2 % shampoo Commonly known as: NIZORAL Apply 1 application topically 3 (three) times a week.   metoprolol succinate 50 MG 24 hr tablet Commonly known as: TOPROL-XL Take 1 tablet (50 mg total) by mouth daily. Take with or immediately following a meal.   niacinamide 500 MG tablet Take 500 mg by mouth 2 (two) times daily with a meal.   ofloxacin 0.3 % ophthalmic solution Commonly known as: OCUFLOX Place 1 drop into the right eye 2 (two) times daily.   ramipril 2.5 MG capsule Commonly known as: ALTACE Take 2.5 mg by mouth daily.   rivastigmine 4.6 mg/24hr Commonly known as: EXELON Place 1 patch (4.6 mg total) onto the skin daily.   tamsulosin 0.4 MG Caps capsule Commonly known as: FLOMAX Take 1 capsule (0.4 mg total) by mouth daily.   triamcinolone cream 0.1 % Commonly known as: KENALOG Apply 1 application topically daily.   valACYclovir 500 MG tablet Commonly known as: VALTREX Take 500 mg by mouth 2 (two) times daily as needed. Flare up        Contact information for follow-up providers     Eustace Moore, MD. Schedule an appointment as soon  as possible for a visit in 2 week(s).   Specialty: Neurosurgery Contact information: [address] [place] [address] [phone]         [facility]. Schedule an appointment as soon as possible for a visit in 1 week(s).   Contact information: [place] [phone]             Contact information for after-discharge care     Destination     [facility] .   Service: Skilled Nursing Contact information: [place] [phone]                    Allergies  Allergen Reactions   Donepezil Other (See Comments)    Per wife "has not done well."   Other Other (See Comments)    Vicryl sutures, pte. Doesn't know what reaction he had, "Drs. Owens Shark + Yeatts deetrmined not to use it on me"    Consultations: Neurosurgery, cardiology   Procedures/Studies: DG Wrist 2 Views Right  Result Date: 11/21/2021 CLINICAL DATA:  Fall, dementia EXAM: RIGHT WRIST - 2 VIEW COMPARISON:  None. FINDINGS: There is no evidence of fracture or dislocation. There is no evidence of arthropathy or other focal bone abnormality. Soft tissues are unremarkable. IMPRESSION: Negative. Electronically Signed   By: Keane Police D.O.   On: 11/21/2021 16:19   DG Wrist Complete Right  Result Date: 11/21/2021 CLINICAL DATA:  Trauma, pain EXAM: RIGHT WRIST - COMPLETE 3+ VIEW COMPARISON:  None. FINDINGS: No recent fracture or dislocation is seen. Degenerative changes are noted with bony spurs in first carpometacarpal joint and the interphalangeal joint of thumb. Minimal bony spurs seen in the lateral radiocarpal joint. IMPRESSION: No recent fracture or dislocation is seen in the right wrist. Electronically Signed   By: Elmer Picker M.D.   On: 11/21/2021 11:29   CT HEAD WO CONTRAST (5MM)  Result Date: 11/23/2021 CLINICAL DATA:  Stroke follow-up.  EXAM: CT HEAD WITHOUT CONTRAST TECHNIQUE: Contiguous axial images were obtained from the base of the skull through the vertex without intravenous contrast. RADIATION DOSE REDUCTION: This exam was performed according to the departmental dose-optimization program which includes automated exposure control, adjustment of the mA and/or kV according to patient size and/or use of iterative reconstruction technique. COMPARISON:  CT head 11/22/2021 FINDINGS: Brain: A parenchymal hematoma inferiorly in the posterior left temporal lobe has mildly increased in size, measuring 3.0 x 5.9 x 2.4 cm (estimated volume of 21 mL). Mild surrounding edema is unchanged, as is mass effect on the temporal horn of the left lateral ventricle. There is trace hemorrhage in the occipital horns of the lateral ventricles which has decreased. Minimal hemorrhage along the posterior aspect of the third ventricle/medial margin of the right thalamus is unchanged. Minimal subarachnoid hemorrhage near the posterior aspect of the left sylvian fissure is unchanged. Small subdural hematomas over the cerebral convexities are unchanged and measure up to 8 mm in thickness on the right and 3 mm on the left. A small amount of subdural blood along the tentorium bilaterally is also unchanged. There is no significant midline shift. There is no evidence of an acute infarct, new hemorrhage, or hydrocephalus. Patchy hypodensities in the cerebral white matter bilaterally are unchanged and nonspecific but compatible with moderate chronic small vessel ischemic disease. There is mild cerebral atrophy. Vascular: Calcified atherosclerosis at the skull base. Skull: No acute fracture or suspicious osseous lesion. Sinuses/Orbits: Unchanged large right mastoid effusion. Clear paranasal sinuses. Bilateral cataract extraction. Right eyelid weight. Other: Posterior scalp skin staples with mild swelling/contusion. Postoperative changes in the right parotid region. IMPRESSION: 1.  Mild enlargement of the left temporal lobe hematoma. Unchanged mild edema. 2. Unchanged small subdural hematomas and minimal subarachnoid hemorrhage. 3. Decreased intraventricular hemorrhage. Electronically Signed   By: Logan Bores M.D.   On: 11/23/2021 13:31   CT HEAD WO CONTRAST (5MM)  Result Date: 11/22/2021 CLINICAL DATA:  Intracranial bleeding, trauma EXAM: CT HEAD WITHOUT CONTRAST TECHNIQUE: Contiguous axial images were obtained from the base of the skull through the vertex without intravenous contrast. COMPARISON:  11/21/2021 FINDINGS: Brain: There is interval appearance of large lobulated intra cerebral hematoma measuring a proximally 4.7 x 2.9 x 2.8 cm in the left posterior temporal lobe. Estimated volume is approximately 19 mL. There is surrounding edema. There is 6 mm area of bleeding in the right posterior third ventricle. There is interval layering of small amount of blood in the dependent portions of occipital lobes in both lateral ventricles. Small 3 mm subdural hematoma seen in the periphery of left cerebral hemisphere is less evident. There is increase in  amount of CSF density adjacent to both frontal lobes, more so on the right side measuring up to 10 mm in the diameter. There is no shift of midline structures. There is increased density between the leaves tentorium with interval worsening more so on the left side. There is subtle increased density in the posterior right sylvian fissure, possibly presence of subarachnoid hemorrhage. Similar finding to a lesser extent is seen in the posterior left sylvian fissure. Vascular: Scattered arterial calcifications are seen. Skull: No displaced fracture is seen. Sinuses/Orbits: Sinuses are unremarkable. There is a metallic density in the anterior right orbit. IMPRESSION: There is interval appearance of 4.7 x 2.9 x 2.8 cm lobulated intra cerebral hematoma in the posterior left temporal lobe. There is interval increase in the amount of blood in the third  and both lateral ventricles. There is increased amount of blood between the leaves of the tentorium, more so on the left side. There is subtle increased density in the posterior aspects of sylvian fissure on both sides, more so on the right side possibly suggesting subarachnoid hemorrhage. 3 mm acute left subdural hematoma seen in the previous examination appears less prominent. There is increased amount of CSF density in the periphery of both frontal lobes, more so on the right side measuring up to 10 mm in thickness without definite evidence of recent bleeding. Imaging findings were relayed to Dr. Tyrell Antonio by telephone call. She will contact neurosurgeon. Electronically Signed   By: Elmer Picker M.D.   On: 11/22/2021 11:41   CT Head Wo Contrast  Addendum Date: 11/21/2021   ADDENDUM REPORT: 11/21/2021 11:59 ADDENDUM: Posterior scalp laceration. Electronically Signed   By: Logan Bores M.D.   On: 11/21/2021 11:59   Result Date: 11/21/2021 CLINICAL DATA:  Head trauma.  Fall today.  Head and neck pain. EXAM: CT HEAD WITHOUT CONTRAST CT CERVICAL SPINE WITHOUT CONTRAST TECHNIQUE: Multidetector CT imaging of the head and cervical spine was performed following the standard protocol without intravenous contrast. Multiplanar CT image reconstructions of the cervical spine were also generated. COMPARISON:  CT head and cervical spine 03/05/2021 FINDINGS: CT HEAD FINDINGS Brain: A new small hyperdense subdural hematoma over the left lateral cerebral convexity measures up to 3 mm in thickness without mass effect. Trace subdural hemorrhage is also present along the posterior aspect of the falx and along the left greater than right tentorial leaflets, and there is also new trace hypoattenuating subdural fluid or blood over the right cerebral convexity. There is minimal acute hemorrhage in the fourth ventricle. There is also a small extra-axial focus of curvilinear hyperdensity along the posterolateral aspect of third  ventricle coursing towards the pineal region (series 4, image 16). No acute cortically based infarct or midline shift is evident. There is mild cerebral atrophy. Patchy hypodensities in the cerebral white matter bilaterally are unchanged and nonspecific but compatible with moderate chronic small vessel ischemic disease. Vascular: Calcified atherosclerosis at the skull base. Skull: No acute fracture or suspicious osseous lesion. Sinuses/Orbits: Clear paranasal sinuses. Unchanged large right mastoid effusion. Right eyelid weight. Bilateral cataract extraction. Other: Postoperative changes involving the right parotid gland. CT CERVICAL SPINE FINDINGS Alignment: Chronic trace anterolisthesis of C7 on T1. Skull base and vertebrae: No acute fracture or suspicious osseous lesion. Soft tissues and spinal canal: No prevertebral fluid or swelling. No visible canal hematoma. Disc levels: Multilevel disc degeneration, moderate at C6-7. Asymmetrically advanced right-sided cervical facet arthrosis at multiple levels. Right facet ankylosis at C2-3. Moderate multilevel neural foraminal stenosis and  at least mild multilevel spinal stenosis. Upper chest: Clear lung apices. Other: Postoperative changes in the right upper neck. IMPRESSION: 1. Small acute bilateral subdural hematomas without mass effect. 2. Small volume acute hemorrhage in the fourth ventricle. 3. Small hyperdensity along the posterolateral aspect of the third ventricle on the right, likely additional extra-axial hemorrhage although deep cerebral vein thrombosis is an alternative consideration. 4. Moderate chronic small vessel ischemic disease. 5. No acute cervical spine fracture. Critical Value/emergent results were called by telephone at the time of interpretation on 11/21/2021 at 11:47 am to provider Myna Bright, who verbally acknowledged these results. Electronically Signed: By: Logan Bores M.D. On: 11/21/2021 11:54   CT Cervical Spine Wo Contrast  Addendum  Date: 11/21/2021   ADDENDUM REPORT: 11/21/2021 11:59 ADDENDUM: Posterior scalp laceration. Electronically Signed   By: Logan Bores M.D.   On: 11/21/2021 11:59   Result Date: 11/21/2021 CLINICAL DATA:  Head trauma.  Fall today.  Head and neck pain. EXAM: CT HEAD WITHOUT CONTRAST CT CERVICAL SPINE WITHOUT CONTRAST TECHNIQUE: Multidetector CT imaging of the head and cervical spine was performed following the standard protocol without intravenous contrast. Multiplanar CT image reconstructions of the cervical spine were also generated. COMPARISON:  CT head and cervical spine 03/05/2021 FINDINGS: CT HEAD FINDINGS Brain: A new small hyperdense subdural hematoma over the left lateral cerebral convexity measures up to 3 mm in thickness without mass effect. Trace subdural hemorrhage is also present along the posterior aspect of the falx and along the left greater than right tentorial leaflets, and there is also new trace hypoattenuating subdural fluid or blood over the right cerebral convexity. There is minimal acute hemorrhage in the fourth ventricle. There is also a small extra-axial focus of curvilinear hyperdensity along the posterolateral aspect of third ventricle coursing towards the pineal region (series 4, image 16). No acute cortically based infarct or midline shift is evident. There is mild cerebral atrophy. Patchy hypodensities in the cerebral white matter bilaterally are unchanged and nonspecific but compatible with moderate chronic small vessel ischemic disease. Vascular: Calcified atherosclerosis at the skull base. Skull: No acute fracture or suspicious osseous lesion. Sinuses/Orbits: Clear paranasal sinuses. Unchanged large right mastoid effusion. Right eyelid weight. Bilateral cataract extraction. Other: Postoperative changes involving the right parotid gland. CT CERVICAL SPINE FINDINGS Alignment: Chronic trace anterolisthesis of C7 on T1. Skull base and vertebrae: No acute fracture or suspicious osseous  lesion. Soft tissues and spinal canal: No prevertebral fluid or swelling. No visible canal hematoma. Disc levels: Multilevel disc degeneration, moderate at C6-7. Asymmetrically advanced right-sided cervical facet arthrosis at multiple levels. Right facet ankylosis at C2-3. Moderate multilevel neural foraminal stenosis and at least mild multilevel spinal stenosis. Upper chest: Clear lung apices. Other: Postoperative changes in the right upper neck. IMPRESSION: 1. Small acute bilateral subdural hematomas without mass effect. 2. Small volume acute hemorrhage in the fourth ventricle. 3. Small hyperdensity along the posterolateral aspect of the third ventricle on the right, likely additional extra-axial hemorrhage although deep cerebral vein thrombosis is an alternative consideration. 4. Moderate chronic small vessel ischemic disease. 5. No acute cervical spine fracture. Critical Value/emergent results were called by telephone at the time of interpretation on 11/21/2021 at 11:47 am to provider Myna Bright, who verbally acknowledged these results. Electronically Signed: By: Logan Bores M.D. On: 11/21/2021 11:54   ECHOCARDIOGRAM COMPLETE  Result Date: 11/22/2021    ECHOCARDIOGRAM REPORT   Patient Name:   TEVON BERHANE [facility] Date of Exam: 11/22/2021 Medical Rec #:  [id number]        Height:       72.0 in Accession #:    [id number]       Weight:       170.5 lb Date of Birth:  September 02, 1941        BSA:          1.991 m Patient Age:    62 years         BP:           107/67 mmHg Patient Gender: M                HR:           82 bpm. Exam Location:  Inpatient Procedure: 2D Echo, Cardiac Doppler and Color Doppler Indications:    syncope  History:        Patient has prior history of Echocardiogram examinations, most                 recent 08/29/2019. Risk Factors:Hypertension. Hyperlipidemia.  Sonographer:    Beryle Beams Referring Phys: [id number] [place]  1. Left ventricular ejection fraction, by estimation, is 55  to 60%. The left ventricle has normal function. Left ventricular endocardial border not optimally defined to evaluate regional wall motion. There may be hypokinesis of the apical segments of the inferior wall and inferior septum. Left ventricular diastolic parameters are consistent with Grade I diastolic dysfunction (impaired relaxation).  2. Right ventricular systolic function is normal. The right ventricular size is normal.  3. The mitral valve is normal in structure. No evidence of mitral valve regurgitation. No evidence of mitral stenosis.  4. The aortic valve is normal in structure. Aortic valve regurgitation is not visualized. No aortic stenosis is present.  5. The inferior vena cava is normal in size with greater than 50% respiratory variability, suggesting right atrial pressure of 3 mmHg. FINDINGS  Left Ventricle: Left ventricular ejection fraction, by estimation, is 55 to 60%. The left ventricle has normal function. Left ventricular endocardial border not optimally defined to evaluate regional wall motion. The left ventricular internal cavity size was normal in size. There is no left ventricular hypertrophy. Left ventricular diastolic parameters are consistent with Grade I diastolic dysfunction (impaired relaxation). Indeterminate filling pressures. Right Ventricle: Ivc not seen. The right ventricular size is normal. No increase in right ventricular wall thickness. Right ventricular systolic function is normal. Left Atrium: Left atrial size was normal in size. Right Atrium: Right atrial size was normal in size. Pericardium: Trivial pericardial effusion is present. Mitral Valve: The mitral valve is normal in structure. No evidence of mitral valve regurgitation. No evidence of mitral valve stenosis. Tricuspid Valve: The tricuspid valve is normal in structure. Tricuspid valve regurgitation is not demonstrated. No evidence of tricuspid stenosis. Aortic Valve: The aortic valve is normal in structure. Aortic  valve regurgitation is not visualized. No aortic stenosis is present. Aortic valve mean gradient measures 3.0 mmHg. Aortic valve peak gradient measures 5.1 mmHg. Aortic valve area, by VTI measures 2.33 cm. Pulmonic Valve: The pulmonic valve was normal in structure. Pulmonic valve regurgitation is not visualized. No evidence of pulmonic stenosis. Aorta: The aortic root is normal in size and structure. Venous: The inferior vena cava is normal in size with greater than 50% respiratory variability, suggesting right atrial pressure of 3 mmHg. IAS/Shunts: No atrial level shunt detected by color flow Doppler.  LEFT VENTRICLE PLAX 2D LVIDd:         4.80 cm  Diastology LVIDs:         2.80 cm     LV e' medial:    4.46 cm/s LV PW:         1.10 cm     LV E/e' medial:  12.1 LV IVS:        0.70 cm     LV e' lateral:   4.46 cm/s LVOT diam:     1.80 cm     LV E/e' lateral: 12.1 LV SV:         48 LV SV Index:   24 LVOT Area:     2.54 cm  LV Volumes (MOD) LV vol d, MOD A2C: 35.3 ml LV vol d, MOD A4C: 42.3 ml LV vol s, MOD A2C: 13.3 ml LV vol s, MOD A4C: 19.1 ml LV SV MOD A2C:     22.0 ml LV SV MOD A4C:     42.3 ml LV SV MOD BP:      24.4 ml RIGHT VENTRICLE RV S prime:     9.36 cm/s TAPSE (M-mode): 2.0 cm LEFT ATRIUM           Index        RIGHT ATRIUM          Index LA diam:      2.70 cm 1.36 cm/m   RA Area:     6.72 cm LA Vol (A2C): 33.8 ml 16.98 ml/m  RA Volume:   9.86 ml  4.95 ml/m LA Vol (A4C): 21.7 ml 10.90 ml/m  AORTIC VALVE                    PULMONIC VALVE AV Area (Vmax):    2.32 cm     PV Vmax:       0.88 m/s AV Area (Vmean):   2.60 cm     PV Peak grad:  3.1 mmHg AV Area (VTI):     2.33 cm AV Vmax:           113.00 cm/s AV Vmean:          75.100 cm/s AV VTI:            0.205 m AV Peak Grad:      5.1 mmHg AV Mean Grad:      3.0 mmHg LVOT Vmax:         103.00 cm/s LVOT Vmean:        76.700 cm/s LVOT VTI:          0.188 m LVOT/AV VTI ratio: 0.92  AORTA Ao Root diam: 2.60 cm Ao Asc diam:  2.50 cm MITRAL VALVE                 TRICUSPID VALVE MV Area (PHT): 5.54 cm     TV Peak grad:   13.4 mmHg MV Decel Time: 137 msec     TV Mean grad:   11.0 mmHg MV E velocity: 53.90 cm/s   TV Vmax:        1.83 m/s MV A velocity: 103.00 cm/s  TV Vmean:       159.0 cm/s MV E/A ratio:  0.52         TV VTI:         0.58 msec                              SHUNTS  Systemic VTI:  0.19 m                             Systemic Diam: 1.80 cm Sanda Klein MD Electronically signed by Sanda Klein MD Signature Date/Time: 11/22/2021/10:29:33 AM    Final    VAS US CAROTID  Result Date: 11/22/2021 Carotid Arterial Duplex Study Patient Name:  HAZE ANTILLON [facility]  Date of Exam:   11/22/2021 Medical Rec #: [id number]         Accession #:    [id number] Date of Birth: 08-26-1941         Patient Gender: M Patient Age:   34 years Exam Location:  [facility] Procedure:      VAS US CAROTID Referring Phys: Orma Flaming --------------------------------------------------------------------------------  Indications:       History of carotid stenosis. Risk Factors:      Hypertension, hyperlipidemia, past history of smoking. Other Factors:     Dementia, multiple falls. Comparison Study:  09-08-2020 Most recent prior carotid duplex consistent with                    50-69% ICA stenosis bilaterally. Performing Technologist: Darlin Coco RDMS, RVT  Examination Guidelines: A complete evaluation includes B-mode imaging, spectral Doppler, color Doppler, and power Doppler as needed of all accessible portions of each vessel. Bilateral testing is considered an integral part of a complete examination. Limited examinations for reoccurring indications may be performed as noted.  Right Carotid Findings: +----------+--------+--------+--------+------------------+---------------------+             PSV cm/s EDV cm/s Stenosis Plaque Description Comments               +----------+--------+--------+--------+------------------+---------------------+  CCA Prox    64       21                                   intimal thickening     +----------+--------+--------+--------+------------------+---------------------+  CCA Distal 50       17                hyperechoic,                                                                     heterogenous and                                                                 irregular                                 +----------+--------+--------+--------+------------------+---------------------+  ICA Prox   289      66       60-79%                                             +----------+--------+--------+--------+------------------+---------------------+  ICA Mid    287      49                                   post-stenotic                                                                    turbulence             +----------+--------+--------+--------+------------------+---------------------+  ICA Distal 54       16                                   tortuous               +----------+--------+--------+--------+------------------+---------------------+  ECA        72       22                                                          +----------+--------+--------+--------+------------------+---------------------+ +----------+--------+-------+----------------+-------------------+             PSV cm/s EDV cms Describe         Arm Pressure (mmHG)  +----------+--------+-------+----------------+-------------------+  Subclavian 155              Multiphasic, WNL                      +----------+--------+-------+----------------+-------------------+ +---------+--------+--+--------+--+---------+  Vertebral PSV cm/s 46 EDV cm/s 11 Antegrade  +---------+--------+--+--------+--+---------+  Left Carotid Findings: +----------+--------+--------+--------+-------------------+------------------+             PSV cm/s EDV cm/s Stenosis Plaque Description  Comments            +----------+--------+--------+--------+-------------------+------------------+  CCA Prox    67       15                                                        +----------+--------+--------+--------+-------------------+------------------+  CCA Distal 72       23                                    intimal thickening  +----------+--------+--------+--------+-------------------+------------------+  ICA Prox   67       21       1-39%    heterogenous , mild                     +----------+--------+--------+--------+-------------------+------------------+  ICA Mid    54       19                                                        +----------+--------+--------+--------+-------------------+------------------+  ICA Distal 58       22                                                        +----------+--------+--------+--------+-------------------+------------------+  ECA        154      27       >50%                                             +----------+--------+--------+--------+-------------------+------------------+ +----------+--------+--------+----------------+-------------------+             PSV cm/s EDV cm/s Describe         Arm Pressure (mmHG)  +----------+--------+--------+----------------+-------------------+  Subclavian 118               Multiphasic, WNL                      +----------+--------+--------+----------------+-------------------+ +---------+--------+--+--------+--+---------+  Vertebral PSV cm/s 40 EDV cm/s 11 Antegrade  +---------+--------+--+--------+--+---------+   Summary: Right Carotid: Velocities in the right ICA are consistent with a 60-79%                stenosis. Left Carotid: Velocities in the left ICA are consistent with a 1-39% stenosis.               The ECA appears >50% stenosed. Vertebrals:  Bilateral vertebral arteries demonstrate antegrade flow. Subclavians: Normal flow hemodynamics were seen in bilateral subclavian              arteries. *See table(s) above for measurements and observations.  Electronically signed by Harold Barban MD on 11/22/2021 at 9:42:31 PM.    Final        Subjective: Patient seen and examined at the bedside this morning.  Hemodynamically stable lying on bed, sleepy.  Family at the bedside.  Family wants to take him to skilled nursing facility today.  Discharge Exam: Vitals:   11/25/21 0411 11/25/21 0742  BP: 127/80 127/64  Pulse: 65 67  Resp: 16 20  Temp: 99.2 F (37.3 C) 98.7 F (37.1 C)  SpO2: 95% 94%   Vitals:   11/24/21 2029 11/24/21 2311 11/25/21 0411 11/25/21 0742  BP: 131/79 130/81 127/80 127/64  Pulse: 72 72 65 67  Resp: 15 20 16 20   Temp: 98.9 F (37.2 C) 99.7 F (37.6 C) 99.2 F (37.3 C) 98.7 F (37.1 C)  TempSrc: Oral Oral Oral Oral  SpO2: 96% 94% 95% 94%  Weight:      Height:        General: Sleepy, confused, very deconditioned  cardiovascular: RRR, S1/S2 +, no rubs, no gallops Respiratory: CTA bilaterally, no wheezing, no rhonchi Abdominal: Soft, NT, ND, bowel sounds + Extremities: no edema, no cyanosis    The results of significant diagnostics from this hospitalization (including imaging, microbiology, ancillary and laboratory) are listed below for reference.     Microbiology: No results found for this or any previous visit (from the past 240 hour(s)).   Labs: BNP (last 3 results) No results for input(s): BNP in the last 8760 hours. Basic Metabolic Panel: Recent Labs  Lab 11/21/21 2051 11/22/21 1402 11/23/21 0349 11/23/21 2328 11/24/21 0203  NA 134* 137 138  132* 133*  K 3.8 3.9 4.2 3.6 3.6  CL 100 102 106 97* 99  CO2 22 24 23 24 24   GLUCOSE 168* 111* 101* 152* 142*  BUN 14 17 15 15 15   CREATININE 1.11 1.08 1.00 1.04 0.94  CALCIUM 9.3 8.9 8.5* 8.5* 8.6*  MG  --   --   --  1.8  --    Liver Function Tests: No results for input(s): AST, ALT, ALKPHOS, BILITOT, PROT, ALBUMIN in the last 168 hours. No results for input(s): LIPASE, AMYLASE in the last 168 hours. Recent Labs  Lab 11/22/21 0936  AMMONIA 29   CBC: Recent Labs  Lab 11/21/21 1050 11/22/21 1402 11/23/21 0349  11/24/21 0203  WBC 11.1* 12.1* 10.0 12.5*  NEUTROABS 9.1*  --   --  10.2*  HGB 17.7* 15.9 15.9 14.4  HCT 54.0* 48.7 48.4 43.4  MCV 93.3 90.9 92.0 90.4  PLT 198 199 181 168   Cardiac Enzymes: Recent Labs  Lab 11/21/21 2051  CKTOTAL 318   BNP: Invalid input(s): POCBNP CBG: Recent Labs  Lab 11/21/21 1037 11/22/21 0823  GLUCAP 124* 144*   D-Dimer No results for input(s): DDIMER in the last 72 hours. Hgb A1c No results for input(s): HGBA1C in the last 72 hours. Lipid Profile No results for input(s): CHOL, HDL, LDLCALC, TRIG, CHOLHDL, LDLDIRECT in the last 72 hours. Thyroid function studies No results for input(s): TSH, T4TOTAL, T3FREE, THYROIDAB in the last 72 hours.  Invalid input(s): FREET3 Anemia work up No results for input(s): VITAMINB12, FOLATE, FERRITIN, TIBC, IRON, RETICCTPCT in the last 72 hours. Urinalysis    Component Value Date/Time   COLORURINE YELLOW 11/21/2021 1657   APPEARANCEUR CLEAR 11/21/2021 1657   LABSPEC 1.017 11/21/2021 1657   PHURINE 6.0 11/21/2021 1657   GLUCOSEU NEGATIVE 11/21/2021 1657   HGBUR SMALL (A) 11/21/2021 1657   BILIRUBINUR NEGATIVE 11/21/2021 1657   KETONESUR 20 (A) 11/21/2021 1657   PROTEINUR 100 (A) 11/21/2021 1657   NITRITE NEGATIVE 11/21/2021 1657   LEUKOCYTESUR NEGATIVE 11/21/2021 1657   Sepsis Labs Invalid input(s): PROCALCITONIN,  WBC,  LACTICIDVEN Microbiology No results found for this or any previous visit (from the past 240 hour(s)).  Please note: You were cared for by a hospitalist during your hospital stay. Once you are discharged, your primary care physician will handle any further medical issues. Please note that NO REFILLS for any discharge medications will be authorized once you are discharged, as it is imperative that you return to your primary care physician (or establish a relationship with a primary care physician if you do not have one) for your post hospital discharge needs so that they can reassess your  need for medications and monitor your lab values.    Time coordinating discharge: 40 minutes  SIGNED:   Shelly Coss, MD  [facility] 11/25/2021, 10:22 AM Pager [phone]  If 7PM-7AM, please contact night-coverage [email or web address] Password [email or web address]

## 2021-11-25 NOTE — Progress Notes (Signed)
Expected evolution of L posterior temporal ICH vs contusion. No mass effect. No indication for surgery. Exam stable. Hematoma should resolve with time.

## 2021-11-26 ENCOUNTER — Emergency Department (HOSPITAL_BASED_OUTPATIENT_CLINIC_OR_DEPARTMENT_OTHER)
Admission: EM | Admit: 2021-11-26 | Discharge: 2021-11-26 | Disposition: A | Discharge status: Home / Self Care | Payer: PPO | Attending: Emergency Medicine | Admitting: Emergency Medicine

## 2021-11-26 DIAGNOSIS — S3730XA Unspecified injury of urethra, initial encounter: Secondary | ICD-10-CM | POA: Diagnosis not present

## 2021-11-26 DIAGNOSIS — I10 Essential (primary) hypertension: Secondary | ICD-10-CM | POA: Diagnosis not present

## 2021-11-26 DIAGNOSIS — Z79899 Other long term (current) drug therapy: Secondary | ICD-10-CM | POA: Diagnosis not present

## 2021-11-26 DIAGNOSIS — X58XXXA Exposure to other specified factors, initial encounter: Secondary | ICD-10-CM | POA: Insufficient documentation

## 2021-11-26 DIAGNOSIS — R338 Other retention of urine: Secondary | ICD-10-CM | POA: Diagnosis not present

## 2021-11-26 DIAGNOSIS — R58 Hemorrhage, not elsewhere classified: Secondary | ICD-10-CM | POA: Diagnosis not present

## 2021-11-26 DIAGNOSIS — R339 Retention of urine, unspecified: Secondary | ICD-10-CM | POA: Insufficient documentation

## 2021-11-26 MED ORDER — METOPROLOL SUCCINATE ER 50 MG PO TB24
50.0000 mg | ORAL_TABLET | Freq: Every day | ORAL | Status: DC
  Administered 2021-11-26: 50 mg via ORAL
  Filled 2021-11-26: qty 1

## 2021-11-26 NOTE — ED Provider Notes (Signed)
[name] Provider Note   CSN: [id number] Arrival date & time: 11/26/21  0272     History  No chief complaint on file.   Jeffrey [name] is a 81 y.o. male.  Patient is an 81 year old gentleman with a history of hypertension, hypercholesterolemia, chronic indwelling Foley catheter who lives at [facility] who is presenting today from the facility due to blood in his catheter.  The facility reported to EMS that they think he accidentally jerked the catheter at some point this morning.  They noticed blood in the Foley bag and they deflated the balloon and he started having bleeding through the meatus.  They then reinflated the balloon and sent him here.  Patient denies any significant pain.  He does not take any anticoagulation.  No history of significant hematuria in the past.  The history is provided by the patient and the EMS personnel.      Home Medications Prior to Admission medications   Medication Sig Start Date End Date Taking? Authorizing Provider  atorvastatin (LIPITOR) 20 MG tablet Take 20 mg by mouth daily at 6 PM.     [provider]  citalopram (CELEXA) 10 MG tablet Take 1 tablet (10 mg total) by mouth daily. 12/22/20   Dohmeier, Asencion Partridge, MD  ketoconazole (NIZORAL) 2 % shampoo Apply 1 application topically 3 (three) times a week. 06/06/21   [provider]  metoprolol succinate (TOPROL-XL) 50 MG 24 hr tablet Take 1 tablet (50 mg total) by mouth daily. Take with or immediately following a meal. 11/25/21   Shelly Coss, MD  niacinamide 500 MG tablet Take 500 mg by mouth 2 (two) times daily with a meal.    [provider]  ofloxacin (OCUFLOX) 0.3 % ophthalmic solution Place 1 drop into the right eye 2 (two) times daily. 10/13/21   [provider]  ramipril (ALTACE) 2.5 MG capsule Take 2.5 mg by mouth daily. 11/11/19   [provider]  rivastigmine (EXELON) 4.6 mg/24hr Place 1 patch (4.6 mg total) onto the  skin daily. 05/25/21   Dohmeier, Asencion Partridge, MD  tamsulosin (FLOMAX) 0.4 MG CAPS capsule Take 1 capsule (0.4 mg total) by mouth daily. 11/25/21   Shelly Coss, MD  triamcinolone cream (KENALOG) 0.1 % Apply 1 application topically daily. 10/19/21   [provider]  valACYclovir (VALTREX) 500 MG tablet Take 500 mg by mouth 2 (two) times daily as needed. Flare up 08/15/21   [provider]      Allergies    Donepezil and Other    Review of Systems   Review of Systems  Physical Exam Updated Vital Signs BP (!) 167/94    Pulse 75    Temp 98 F (36.7 C) (Oral)    Resp 16    SpO2 97%  Physical Exam Vitals and nursing note reviewed.  Constitutional:      General: He is not in acute distress.    Appearance: He is well-developed.  HENT:     Head: Normocephalic and atraumatic.  Eyes:     Conjunctiva/sclera: Conjunctivae normal.     Pupils: Pupils are equal, round, and reactive to light.  Cardiovascular:     Rate and Rhythm: Normal rate and regular rhythm.     Heart sounds: No murmur heard. Pulmonary:     Effort: Pulmonary effort is normal. No respiratory distress.     Breath sounds: Normal breath sounds. No wheezing or rales.  Abdominal:     General: There is no distension.  Palpations: Abdomen is soft.     Tenderness: There is no abdominal tenderness. There is no guarding or rebound.  Genitourinary:    Comments: Dried blood around the meatus where the Foley catheter is present.  Blood present in the catheter bag Musculoskeletal:        General: No tenderness. Normal range of motion.     Cervical back: Normal range of motion and neck supple.  Skin:    General: Skin is warm and dry.     Findings: No erythema or rash.  Neurological:     Mental Status: He is alert and oriented to person, place, and time. Mental status is at baseline.  Psychiatric:        Mood and Affect: Mood normal.        Behavior: Behavior normal.    ED Results / Procedures / Treatments    Labs (all labs ordered are listed, but only abnormal results are displayed) Labs Reviewed - No data to display  EKG None  Radiology No results found.  Procedures Procedures    Medications Ordered in ED Medications - No data to display  ED Course/ Medical Decision Making/ A&P                           Medical Decision Making  Patient presenting today due to hematuria.  Think that his catheter got pulled.  He does have significant blood in his catheter bag and there is some bleeding around the meatus.  They reinflated the balloon and concerned they may have inflated it within the urethra.  We will change Foley catheter.  Patient is not on any anticoagulation based on evaluation of external medical records that came with the patient.  He is having an indwelling Foley catheter for some time.  Otherwise he has no abdominal pain or symptoms to suggest retention or infection.  12:28 PM Attempting to pass a new Foley catheter has not been possible.  We have tried multiple catheters including coud catheters and it continues to coil in the urethra.  Patient does have urine in his bladder but is not uncomfortable at this time.  Feel that he will need urology for a cystoscopy for catheter placement.  Daughter arrived and gave further history that patient has recently been discharged from the hospital after falls, chronic subdural and subarachnoid's and he had a Foley catheter placed but he was in the hospital and had it removed but continued to retain and had the catheter put back in.  So at this time he appears to need it as he has not been able to spontaneously urinate here.  Spoke with Dr. Shellia Carwin with urology who will see the pt at [facility] for catheter placement.  Dr. Roslynn Amble at [facility] is aware.        Final Clinical Impression(s) / ED Diagnoses Final diagnoses:  Urinary retention  Urethral trauma, initial encounter    Rx / DC Orders ED Discharge Orders     None          Blanchie Dessert, MD 11/26/21 1237

## 2021-11-26 NOTE — ED Notes (Signed)
Family asked if the Pt could take his prescribed meds for Hypertension. Provider was asked and was okay with the request.

## 2021-11-26 NOTE — ED Notes (Signed)
I have called report to [facility] and to the [facility] E.D. charge.

## 2021-11-26 NOTE — ED Notes (Signed)
Given 2.5 Ramapril by [facility]. GCS 14.

## 2021-11-26 NOTE — ED Notes (Signed)
Old #16 coude cath. Removed a this time after balloon emptied. (it wasn't in very far). New #16 foley inserted. A quantity of clotted blood obtained. I then attempted a small (10 cc) sterile saline flush, which I found very difficult to instill. Dr. Maryan Rued notified of this at this time. Pt. Tolerated this well; and his daughter stayed in the room while this procedure was performed.

## 2021-11-26 NOTE — Consult Note (Signed)
Urology Consult   Reason for consult: urinary retention, difficult foley  History of Present Illness: Jeffrey [name] is a 81 y.o. who presents to the ED today c/o urinary retention. He recently had a foley placed after a fall. Unfortunately, he self-dc'd the catheter. Mr Jeffrey [name] then presented to an outside ED where they were not able to place a foley  Mr Jeffrey [name] has a history of dementia, but his history was provided by his wife and daughter. He has never seen a urologist before. He does have a history of acute urinary retention episodes that seem to be associated with hospitalizations for other issues. 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   Current Outpatient Medications on File Prior to Encounter  Medication Sig Dispense Refill   atorvastatin (LIPITOR) 20 MG tablet Take 20 mg by mouth daily at 6 PM.      citalopram (CELEXA) 10 MG tablet Take 1 tablet (10 mg total) by mouth daily. 90 tablet 3   ketoconazole (NIZORAL) 2 % shampoo Apply 1 application topically 3 (three) times a week.     metoprolol succinate (TOPROL-XL) 50 MG 24 hr tablet Take 1 tablet (50 mg total) by mouth daily. Take with or immediately following a meal. 30 tablet 1   niacinamide 500 MG tablet Take 500 mg by mouth 2 (two) times daily with a meal.     ofloxacin (OCUFLOX) 0.3 % ophthalmic solution Place 1 drop into the right eye 2 (two) times daily.     ramipril (ALTACE) 2.5 MG capsule  Take 2.5 mg by mouth daily.     rivastigmine (EXELON) 4.6 mg/24hr Place 1 patch (4.6 mg total) onto the skin daily. 30 patch 12   tamsulosin (FLOMAX) 0.4 MG CAPS capsule Take 1 capsule (0.4 mg total) by mouth daily. 30 capsule 1   triamcinolone cream (KENALOG) 0.1 % Apply 1 application topically daily.     valACYclovir (VALTREX) 500 MG tablet Take 500 mg by mouth 2 (two) times daily as needed. Flare up       Scheduled Meds:  metoprolol succinate  50 mg Oral Daily   Continuous Infusions: PRN Meds:.  Allergies:  Allergies  Allergen Reactions   Donepezil Other (See Comments)    Per wife "has not done well."   Other Other (See Comments)    Vicryl sutures, pte. Doesn't know what reaction he had, "Drs. Owens Shark + Yeatts deetrmined not to use it on me"    Family History  Problem Relation Age of Onset   Heart disease Father    Heart attack Father    Alzheimer's disease Mother    Heart failure Brother    Colon cancer Neg Hx    Stomach cancer Neg Hx    Esophageal cancer Neg Hx    Rectal cancer Neg Hx    Liver cancer  Neg Hx     Social History:  reports that he quit smoking about 55 years ago. His smoking use included cigarettes. He has a 0.75 pack-year smoking history. He has never used smokeless tobacco. He reports current alcohol use. He reports that he does not use drugs.  ROS: A complete review of systems was performed.  All systems are negative except for pertinent findings as noted.  Physical Exam:  Vital signs in last 24 hours: Temp:  [98 F (36.7 C)-98.2 F (36.8 C)] 98 F (36.7 C) (01/14 1026) Pulse Rate:  [64-90] 90 (01/14 1630) Resp:  [16-20] 18 (01/14 1630) BP: (124-210)/(78-128) 210/113 (01/14 1630) SpO2:  [94 %-99 %] 95 % (01/14 1630) Constitutional:  Alert and oriented, No acute distress Cardiovascular: Regular rate and rhythm Respiratory: Normal respiratory effort, Lungs clear bilaterally GI: Abdomen is soft, nontender, nondistended, no abdominal masses GU: No  CVA tenderness Neurologic: Grossly intact, no focal deficits Psychiatric: Normal mood and affect  Laboratory Data:  Recent Labs    11/24/21 0203  WBC 12.5*  HGB 14.4  HCT 43.4  PLT 168    Recent Labs    11/23/21 2328 11/24/21 0203  NA 132* 133*  K 3.6 3.6  CL 97* 99  GLUCOSE 152* 142*  BUN 15 15  CALCIUM 8.5* 8.6*  CREATININE 1.04 0.94     No results found for this or any previous visit (from the past 24 hour(s)). Recent Results (from the past 240 hour(s))  Resp Panel by RT-PCR (Flu A&B, Covid) Nasopharyngeal Swab     Status: None   Collection Time: 11/25/21  9:04 AM   Specimen: Nasopharyngeal Swab; Nasopharyngeal(NP) swabs in vial transport medium  Result Value Ref Range Status   SARS Coronavirus 2 by RT PCR NEGATIVE NEGATIVE Final    Comment: (NOTE) SARS-CoV-2 target nucleic acids are NOT DETECTED.  The SARS-CoV-2 RNA is generally detectable in upper respiratory specimens during the acute phase of infection. The lowest concentration of SARS-CoV-2 viral copies this assay can detect is 138 copies/mL. A negative result does not preclude SARS-Cov-2 infection and should not be used as the sole basis for treatment or other patient management decisions. A negative result may occur with  improper specimen collection/handling, submission of specimen other than nasopharyngeal swab, presence of viral mutation(s) within the areas targeted by this assay, and inadequate number of viral copies(<138 copies/mL). A negative result must be combined with clinical observations, patient history, and epidemiological information. The expected result is Negative.  Fact Sheet for Patients:  EntrepreneurPulse.com.au  Fact Sheet for Healthcare Providers:  IncredibleEmployment.be  This test is no t yet approved or cleared by the [place] FDA and  has been authorized for detection and/or diagnosis of SARS-CoV-2 by FDA under an Emergency Use  Authorization (EUA). This EUA will remain  in effect (meaning this test can be used) for the duration of the COVID-19 declaration under Section 564(b)(1) of the Act, 21 U.S.C.section 360bbb-3(b)(1), unless the authorization is terminated  or revoked sooner.       Influenza A by PCR NEGATIVE NEGATIVE Final   Influenza B by PCR NEGATIVE NEGATIVE Final    Comment: (NOTE) The Xpert Xpress SARS-CoV-2/FLU/RSV plus assay is intended as an aid in the diagnosis of influenza from Nasopharyngeal swab specimens and should not be used as a sole basis for treatment. Nasal washings and aspirates are unacceptable for Xpert Xpress SARS-CoV-2/FLU/RSV testing.  Fact Sheet for Patients: EntrepreneurPulse.com.au  Fact Sheet for Healthcare Providers: IncredibleEmployment.be  This test  is not yet approved or cleared by the [place] and has been authorized for detection and/or diagnosis of SARS-CoV-2 by FDA under an Emergency Use Authorization (EUA). This EUA will remain in effect (meaning this test can be used) for the duration of the COVID-19 declaration under Section 564(b)(1) of the Act, 21 U.S.C. section 360bbb-3(b)(1), unless the authorization is terminated or revoked.  Performed at [facility], [place] [address], [place], [place] [address]     Renal Function: Recent Labs    11/21/21 1050 11/21/21 2051 11/22/21 1402 11/23/21 0349 11/23/21 2328 11/24/21 0203  CREATININE 1.10 1.11 1.08 1.00 1.04 0.94   Estimated Creatinine Clearance: 68.5 mL/min (by C-G formula based on SCr of 0.94 mg/dL).  Radiologic Imaging: No results found.  Procedure Note:  The patient was prepped and draped in the usual sterile fashion. I carefully inserted a 16 fr foley into the bladder with immediate return of brown urine. It was connected to the appropriate collection bag.   Impression/Recommendation Acute urinary retention 2.  Difficult foley  placement  See procedure note above, I was able to insert a 16 Fr coude catheter without issue. Mr Burruss should be sent out with ~3 days of antibiotics as infection prophylaxis (keflex or bactrim). Otherwise, he can follow up with urology in ~5-7days for a voiding trial  Donald Pore MD 11/26/2021, 5:10 PM  [facility]  Pager: [phone]

## 2021-11-26 NOTE — ED Provider Notes (Signed)
3:34 PM Patient transferred from [facility] for evaluation and Foley placement/possible cystoscopy by urology.   Will assess patient and let urology know that patient is now here.  4:02 PM I assessed patient and she is resting comfortably.  Is not complaining of any abdominal pain.  Family is concerned that the longer it takes to place the Foley the more he will start getting agitated with abdominal pain.  Urology will be paged.  5:46 PM Urology saw the patient and reportedly placed the catheter without difficulty.  I spoke with family of the patient who says that they are getting appointment scheduled and urology said patient was safe for discharge home.  Will discharge patient to go back to facility.   Clinical Impression: 1. Urinary retention   2. Urethral trauma, initial encounter     Disposition: Discharge  Condition: Good  I have discussed the results, Dx and Tx plan with the pt(& family if present). He/she/they expressed understanding and agree(s) with the plan. Discharge instructions discussed at great length. Strict return precautions discussed and pt &/or family have verbalized understanding of the instructions. No further questions at time of discharge.    New Prescriptions   No medications on file    Follow Up: No follow-up provider specified.      Haydin Calandra, Gwenyth Allegra, MD 11/26/21 1747

## 2021-11-26 NOTE — Discharge Instructions (Signed)
You were transferred here for Foley catheter placement and I was informed that it was successfully placed.  As with your discussion with urology, please follow-up with them in clinic.  If any symptoms change or worsen acutely, please return to the nearest emergency department.

## 2021-11-26 NOTE — ED Notes (Signed)
Dr. Maryan Rued removes his existing foley and then attempts x 2 to re-insert foley; each without success. Some meatal bleeding persists.

## 2021-11-27 ENCOUNTER — Other Ambulatory Visit: Payer: Self-pay | Admitting: Urology

## 2021-11-28 ENCOUNTER — Encounter: Payer: Self-pay | Admitting: Neurology

## 2021-11-28 ENCOUNTER — Non-Acute Institutional Stay (SKILLED_NURSING_FACILITY): Payer: PPO | Admitting: Internal Medicine

## 2021-11-28 ENCOUNTER — Encounter: Payer: Self-pay | Admitting: Internal Medicine

## 2021-11-28 ENCOUNTER — Telehealth: Payer: Self-pay | Admitting: Neurology

## 2021-11-28 ENCOUNTER — Ambulatory Visit: Payer: PPO | Admitting: Neurology

## 2021-11-28 VITALS — BP 138/88 | HR 72 | Ht 72.0 in | Wt 159.0 lb

## 2021-11-28 DIAGNOSIS — G4733 Obstructive sleep apnea (adult) (pediatric): Secondary | ICD-10-CM | POA: Diagnosis not present

## 2021-11-28 DIAGNOSIS — F01C11 Vascular dementia, severe, with agitation: Secondary | ICD-10-CM | POA: Insufficient documentation

## 2021-11-28 DIAGNOSIS — G309 Alzheimer's disease, unspecified: Secondary | ICD-10-CM

## 2021-11-28 DIAGNOSIS — I609 Nontraumatic subarachnoid hemorrhage, unspecified: Secondary | ICD-10-CM | POA: Diagnosis not present

## 2021-11-28 DIAGNOSIS — F32A Depression, unspecified: Secondary | ICD-10-CM

## 2021-11-28 DIAGNOSIS — H5789 Other specified disorders of eye and adnexa: Secondary | ICD-10-CM | POA: Diagnosis not present

## 2021-11-28 DIAGNOSIS — R001 Bradycardia, unspecified: Secondary | ICD-10-CM | POA: Diagnosis not present

## 2021-11-28 DIAGNOSIS — S065XAA Traumatic subdural hemorrhage with loss of consciousness status unknown, initial encounter: Secondary | ICD-10-CM

## 2021-11-28 DIAGNOSIS — R296 Repeated falls: Secondary | ICD-10-CM | POA: Diagnosis not present

## 2021-11-28 DIAGNOSIS — F0280 Dementia in other diseases classified elsewhere without behavioral disturbance: Secondary | ICD-10-CM

## 2021-11-28 DIAGNOSIS — R339 Retention of urine, unspecified: Secondary | ICD-10-CM | POA: Diagnosis not present

## 2021-11-28 DIAGNOSIS — S069X9A Unspecified intracranial injury with loss of consciousness of unspecified duration, initial encounter: Secondary | ICD-10-CM | POA: Diagnosis not present

## 2021-11-28 DIAGNOSIS — I480 Paroxysmal atrial fibrillation: Secondary | ICD-10-CM | POA: Diagnosis not present

## 2021-11-28 DIAGNOSIS — F0150 Vascular dementia without behavioral disturbance: Secondary | ICD-10-CM

## 2021-11-28 NOTE — Progress Notes (Signed)
Provider:  Veleta Miners, MD Location:   [facility] Room Number: 811 A Place of Service:  SNF ([id number])  PCP: Crist Infante, MD Patient Care Team: Crist Infante, MD as PCP - General (Internal Medicine)  Extended Emergency Contact Information Primary Emergency Contact: [place] Mobile Phone: [phone] Relation: Spouse Secondary Emergency Contact: Joni Reining Mobile Phone: [phone] Relation: Daughter  Code Status: DNR Goals of Care: Advanced Directive information Advanced Directives 11/28/2021  Does Patient Have a Medical Advance Directive? Yes  Type of Paramedic of [place];Living will;Out of facility DNR (pink MOST or yellow form)  Does patient want to make changes to medical advance directive? No - Patient declined  Copy of [place] in Chart? Yes - validated most recent copy scanned in chart (See row information)  Would patient like information on creating a medical advance directive? -  Pre-existing out of facility DNR order (yellow form or pink MOST form) Yellow form placed in chart (order not valid for inpatient use)      Chief Complaint  Patient presents with   New Admit To SNF    New admission   Health Maintenance    Pneumonia vaccine,COVID vaccine, Flu vaccine    HPI: Patient is a 81 y.o. male seen today for admission to SNF for rehab  Patient was admitted in the hospital from 01/09-01/13 for acute encephalopathy, fall and bilateral subdural hematoma  Patient has a history of dementia possible Alzheimer's, hypertension, hyperlipidemia, OSA on CPAP, CKD, history of skin cancer with extensive history of surgery on right cheek  Patient has a history of recurrent falls.  With recent worsening. 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  He [place] and was the wife found him on the on the concrete floor. He had a CT scan  done which showed small acute bilateral subdural hematoma.  Neurosurgery was consulted who plan for no intervention.   Repeat CT showed interval increase and also lobulated intracerebral hematoma in posterior left temporal lobe and possible subarachnoid hemorrhage.  Also had an episode of A. fib new onset was not started on anticoagulate him due to intracranial bleed we will continue on metoprolol Patient also developed urinary retention was started on Flomax and a Foley catheter was placed. Patient pulled his catheter over the weekend and was sent to the ED with severe hematuria.  Urology had to reinstate his catheter.  Patient continued to stay confused.  Was discharged  to rehab.  Patient unable to give any history.  He is sleeping since he had a neurology visit today.  He scored 6 on his Moca which is a acute drop from his previous 17. Dr. Brett Fairy thinks this combination of vascular and Alzheimer's dementia.  Due to TBI Patient continues to stay a very high fall risk.  Wife is planning to hire sitters.  Per nurses he tries to get up at night.  Is able to use the walker and walk with mild assist.  Continues to stay very confused.  Patient also has the chronic eye infection.  Has seen Dr. Gershon Crane who has recommended continuing Ocuflox and follow-up with the ophthalmologist Dr. Izola Price Also has a history of recurring skin cancer recommended Mohrs surgery I Before the fall patient was independent in his ADLs with supervision.  Was she was unable to leave him due to risk of fall.  She is the main caregiver  Past Medical History:  Diagnosis  Date   Colon polyps    Diverticulosis    Hypercholesterolemia    Hypertension    Internal hemorrhoids    Past Surgical History:  Procedure Laterality Date   EYE SURGERY     X 4   HERNIA REPAIR  2006   abdominal   SKIN CANCER EXCISION     shoulder, basal cell   SQUAMOUS CELL CARCINOMA EXCISION     preauricular on the right   [place]    reports that he quit smoking about 55 years ago. His smoking use included cigarettes. He has a 0.75 pack-year smoking history. He has never used smokeless tobacco. He reports current alcohol use. He reports that he does not use drugs. Social History   Socioeconomic History   Marital status: Married    Spouse name: Not on file   Number of children: 2   Years of education: Not on file   Highest education level: Master's degree (e.g., MA, MS, MEng, MEd, MSW, MBA)  Occupational History   Occupation: retired  Tobacco Use   Smoking status: Former    Packs/day: 0.25    Years: 3.00    Pack years: 0.75    Types: Cigarettes    Quit date: 08/21/1966    Years since quitting: 55.3   Smokeless tobacco: Never  Vaping Use   Vaping Use: Never used  Substance and Sexual Activity   Alcohol use: Yes    Comment: limited   Drug use: No   Sexual activity: Yes  Other Topics Concern   Not on file  Social History Narrative   Not on file   Social Determinants of Health   Financial Resource Strain: Not on file  Food Insecurity: Not on file  Transportation Needs: Not on file  Physical Activity: Not on file  Stress: Not on file  Social Connections: Not on file  Intimate Partner Violence: Not on file    Functional Status Survey:    Family History  Problem Relation Age of Onset   Heart disease Father    Heart attack Father    Alzheimer's disease Mother    Heart failure Brother    Colon cancer Neg Hx    Stomach cancer Neg Hx    Esophageal cancer Neg Hx    Rectal cancer Neg Hx    Liver cancer Neg Hx     Health Maintenance  Topic Date Due   COVID-19 Vaccine (3 - Mixed Product risk series) 01/22/2020   INFLUENZA VACCINE  06/13/2021   TETANUS/TDAP  06/02/2029   Pneumonia Vaccine 41+ Years old  Completed   Zoster Vaccines- Shingrix  Completed   HPV VACCINES  Aged Out    Allergies  Allergen Reactions   Donepezil Other (See Comments)    Per wife "has not done  well."   Other Other (See Comments)    Vicryl sutures, pte. Doesn't know what reaction he had, "Drs. Owens Shark + Yeatts deetrmined not to use it on me"    Allergies as of 11/28/2021       Reactions   Donepezil Other (See Comments)   Per wife "has not done well."   Other Other (See Comments)   Vicryl sutures, pte. Doesn't know what reaction he had, "Drs. Owens Shark + Yeatts deetrmined not to use it on me"        Medication List        Accurate as of November 28, 2021 10:44 AM. If you have any  questions, ask your nurse or doctor.          atorvastatin 20 MG tablet Commonly known as: LIPITOR Take 20 mg by mouth daily at 6 PM.   cephALEXin 500 MG capsule Commonly known as: KEFLEX Take 500 mg by mouth 2 (two) times daily. For 3 days   citalopram 10 MG tablet Commonly known as: CELEXA Take 1 tablet (10 mg total) by mouth daily.   ketoconazole 2 % shampoo Commonly known as: NIZORAL Apply 1 application topically 3 (three) times a week.   metoprolol succinate 50 MG 24 hr tablet Commonly known as: TOPROL-XL Take 1 tablet (50 mg total) by mouth daily. Take with or immediately following a meal.   niacinamide 500 MG tablet Take 500 mg by mouth 2 (two) times daily with a meal.   ofloxacin 0.3 % ophthalmic solution Commonly known as: OCUFLOX Place 1 drop into the right eye 2 (two) times daily.   ramipril 2.5 MG capsule Commonly known as: ALTACE Take 2.5 mg by mouth daily.   rivastigmine 4.6 mg/24hr Commonly known as: EXELON Place 1 patch (4.6 mg total) onto the skin daily.   tamsulosin 0.4 MG Caps capsule Commonly known as: FLOMAX Take 1 capsule (0.4 mg total) by mouth daily.   triamcinolone cream 0.1 % Commonly known as: KENALOG Apply 1 application topically daily.   valACYclovir 500 MG tablet Commonly known as: VALTREX Take 500 mg by mouth 2 (two) times daily as needed. Flare up        Review of Systems  Unable to perform ROS: Dementia   Vitals:   11/28/21  1023  BP: (!) 155/78  Pulse: 69  Resp: 18  Temp: 97.6 F (36.4 C)  SpO2: 98%   There is no height or weight on file to calculate BMI. Physical Exam Vitals reviewed.  Constitutional:      Appearance: Normal appearance.     Comments: Sleepy   HENT:     Head: Normocephalic.     Comments: Had healing Lacerated wound in the back of his head with 2 staples and small opening    Nose: Nose normal.     Mouth/Throat:     Mouth: Mucous membranes are moist.     Pharynx: Oropharynx is clear.  Eyes:     Pupils: Pupils are equal, round, and reactive to light.     Comments: Both Eyes are red  Cardiovascular:     Rate and Rhythm: Normal rate and regular rhythm.     Pulses: Normal pulses.  Pulmonary:     Effort: Pulmonary effort is normal. No respiratory distress.     Breath sounds: Normal breath sounds. No rales.  Abdominal:     General: Abdomen is flat. Bowel sounds are normal.     Palpations: Abdomen is soft.  Musculoskeletal:        General: No swelling.     Cervical back: Neck supple.  Skin:    General: Skin is warm.  Neurological:     Comments: No Focal Deficits Was not following any commands for me today  Psychiatric:        Mood and Affect: Mood normal.        Thought Content: Thought content normal.    Labs reviewed: Basic Metabolic Panel: Recent Labs    11/23/21 0349 11/23/21 2328 11/24/21 0203  NA 138 132* 133*  K 4.2 3.6 3.6  CL 106 97* 99  CO2 23 24 24   GLUCOSE 101* 152* 142*  BUN 15  15 15  CREATININE 1.00 1.04 0.94  CALCIUM 8.5* 8.5* 8.6*  MG  --  1.8  --    Liver Function Tests: Recent Labs    03/05/21 1008  AST 16  ALT 16  ALKPHOS 50  BILITOT 1.1  PROT 6.3*  ALBUMIN 4.1   Recent Labs    03/05/21 1008  LIPASE 16   Recent Labs    11/22/21 0936  AMMONIA 29   CBC: Recent Labs    03/05/21 1008 11/21/21 1050 11/22/21 1402 11/23/21 0349 11/24/21 0203  WBC 8.5 11.1* 12.1* 10.0 12.5*  NEUTROABS 7.5 9.1*  --   --  10.2*  HGB 15.8  17.7* 15.9 15.9 14.4  HCT 47.5 54.0* 48.7 48.4 43.4  MCV 90.8 93.3 90.9 92.0 90.4  PLT 152 198 199 181 168   Cardiac Enzymes: Recent Labs    11/21/21 2051  CKTOTAL 318   BNP: Invalid input(s): POCBNP Lab Results  Component Value Date   HGBA1C 5.2 11/10/2008   Lab Results  Component Value Date   TSH 0.476 11/21/2021   Lab Results  Component Value Date   [id number] 616 11/22/2021   No results found for: FOLATE No results found for: IRON, TIBC, FERRITIN  Imaging and Procedures obtained prior to SNF admission: No results found.  Assessment/Plan  1. Traumatic brain injury with loss of consciousness of less than 1 hour ([place]) Follow up CT Scan scheduled Will also Follow with Neurosurgery  2. Bilateral subdural hematomas Follow with Neurosurgery Will Remove his Staples tomorrow  3. Mixed dementia ([place]) Worsening since his fall Will Need 24 hour Care for now Will Work with Therapy On [place] Patch Taken off Statin due to dementia Will start on Vit B 12 4. Paroxysmal atrial fibrillation ([facility]) No Anticoagulation due to falls and TBI On Metoprolol Follows with Dr Einar Gip  5. Frequent falls Continues ot be issue especially with his Cognition  Will Need 24 hour Care  6. Redness of both eyes On Ocuflox Per Dr Dutch Quint need further eval  Wife   7. OSA (obstructive sleep apnea) Uses CPAP  8. Urinary retention On Flomax and Foley Has failed Trial Has Urology Follow up  9. Depression, unspecified depression type Continue Lexapro  10 Skin Cancer Needs Follow up with Dermatology  Goals of care Wife wants to continue for now Aware that he will Need SNF placement as she is unable to take care of him at home Family/ staff Communication:   Labs/tests ordered:

## 2021-11-28 NOTE — Patient Instructions (Signed)
Delirium Delirium is a state of mental confusion. It comes on quickly and causes significant changes in a person's thinking and behavior. People with delirium usually have trouble paying attention to what is going on or knowing where they are. They may become very withdrawn or very emotional and unable to sit still. They may even see or feel things that are not there (hallucinations). Delirium is a sign of a serious underlying medical condition. What are the causes? Delirium occurs when something suddenly affects the signals that the brain sends out. Brain signals can be affected by anything that puts severe stress on the body and brain and causes brain chemicals to be out of balance. The most common causes of delirium include: Infections. These may be bacterial, viral, fungal, or protozoal. Medicines. These include many over-the-counter and prescription medicines. Recreational drugs. Substance withdrawal. This occurs with sudden discontinuation of alcohol, certain medicines, or recreational drugs. Surgery and anesthesia. Sudden vascular events, such as stroke and brain hemorrhage. Other brain disorders, such as migraines, tumors, seizures, and physical head trauma. Metabolic disorders, such as kidney or liver failure. Low blood oxygen (anoxia). This may occur with lung disease, cardiac arrest, or carbon monoxide poisoning. Hormone imbalances (endocrinopathies), such as an overactive thyroid (hyperthyroidism) or underactive thyroid (hypothyroidism). Vitamin deficiencies. What increases the risk? The following factors may make someone more likely to develop this condition: Being a child. Being an older person. Living alone. Having vision loss or hearing loss. Having an existing brain disease, such as dementia. Having long-lasting (chronic) medical conditions, such as heart disease. Being hospitalized for long periods of time. What are the signs or symptoms? Delirium starts with a sudden  change in a person's thinking or behavior. Symptoms include: Not being able to stay awake (drowsiness) or pay attention. Being confused about places, time, and people. Forgetfulness. Having extreme energy levels. These may be low or high. Changes in sleep patterns. Extreme mood swings, such as sudden anger or anxiety. Focusing on things or ideas that are not important. Rambling and senseless talking. Difficulty speaking, understanding speech, or both. Hallucinations. Tremor or unsteady gait. Symptoms come and go throughout the day and are often worse at the end of the day. How is this diagnosed? People with delirium may not realize that they have the condition. Often, a family member or health care provider is the first person to notice the changes. This condition may be diagnosed based on a physical exam, health history, and tests. The health care provider will obtain a detailed history. This may include questions about: Current symptoms. Medical conditions that you have. Medicines. Drug use. The health care provider will perform a mental status test by: Asking questions to check for confusion. Watching for abnormal behavior. The health care provider may also order lab tests or additional studies to determine the cause of the delirium. How is this treated? Treatment of delirium depends on the cause and severity. Delirium usually goes away within days or weeks of treating the underlying cause. In the meantime, do not leave the person alone because he or she may accidentally cause self-harm. This condition may be treated with supportive care, such as: Increased light during the day and decreased light at night. Low noise level. Uninterrupted sleep. A regular daily schedule. Clocks and calendars to help with orientation. Familiar objects, including the person's pictures and clothing. Frequent visits from familiar family and friends. A healthy diet. Gentle exercise. In more severe  cases of delirium, medicine may be prescribed to help the  person keep calm and think more clearly. Follow these instructions at home:  Continue supportive care as told by a health care provider. Take over-the-counter and prescription medicines only as told by your health care provider. Ask a health care provider before using herbs or supplements. Do not use alcohol or illegal drugs. Keep all follow-up visits. This is important. Contact a health care provider if: Symptoms do not get better or they become worse. New symptoms of delirium develop. Caring for the person at home does not seem safe. Eating, drinking, or communicating stops. There are side effects of medicines, such as changes in sleep patterns, dizziness, weight gain, restlessness, movement changes, or tremors. Get help right away if: The person has thoughts of harming self or harming others. There are serious side effects of medicine, such as: Swelling of the face, lips, tongue, or throat. Fever, confusion, muscle spasms, or seizures. If you ever feel like a loved one may hurt himself or herself or others, or shares thoughts about taking his or her own life, get help right away. You can go to your nearest emergency department or: Call your local emergency services (911 in the [place] Call a suicide crisis helpline, such as the [place] at 469-504-6455 or 988 in the [place]. This is open 24 hours a day in the [place] Text the Crisis Text Line at 303-254-3936 (in the [place].). Summary Delirium is a state of mental confusion. It comes on quickly and causes significant changes in a person's thinking and behavior. Delirium is a sign of a serious underlying medical condition. Certain medical conditions or a long hospital stay may increase the risk of developing delirium. Treatment of delirium involves treating the underlying cause and providing supportive treatments, such as a calm and familiar environment. This  information is not intended to replace advice given to you by your health care provider. Make sure you discuss any questions you have with your health care provider. Document Revised: 05/25/2021 Document Reviewed: 02/06/2020 Elsevier Patient Education  2022 [place]. Dementia Caregiver Guide Dementia is a term used to describe a number of symptoms that affect memory and thinking. The most common symptoms include: Memory loss. Trouble with language and communication. Trouble concentrating. Poor judgment and problems with reasoning. Wandering from home or public places. Extreme anxiety or depression. Being suspicious or having angry outbursts and accusations. Child-like behavior and language. Dementia can be frightening and confusing. And taking care of someone with dementia can be challenging. This guide provides tips to help you when providing care for a person with dementia. How to help manage lifestyle changes Dementia usually gets worse slowly over time. In the early stages, people with dementia can stay independent and safe with some help. In later stages, they need help with daily tasks such as dressing, grooming, and using the bathroom. There are actions you can take to help a person manage his or her life while living with this condition. Communicating When the person is talking or seems frustrated, make eye contact and hold the person's hand. Ask specific questions that need yes or no answers. Use simple words, short sentences, and a calm voice. Only give one direction at a time. When offering choices, limit the person to just one or two. Avoid correcting the person in a negative way. If the person is struggling to find the right words, gently try to help him or her. Preventing injury  Keep floors clear of clutter. Remove rugs, magazine racks, and floor lamps. Keep hallways  well lit, especially at night. Put a handrail and nonslip mat in the bathtub or shower. Put childproof  locks on cabinets that contain dangerous items, such as medicines, alcohol, guns, toxic cleaning items, sharp tools or utensils, matches, and lighters. For doors to the outside of the house, put the locks in places where the person cannot see or reach them easily. This will help ensure that the person does not wander out of the house and get lost. Be prepared for emergencies. Keep a list of emergency phone numbers and addresses in a convenient area. Remove car keys and lock garage doors so that the person does not try to get in the car and drive. Have the person wear a bracelet that tracks locations and identifies the person as having memory problems. This should be worn at all times for safety. Helping with daily life  Keep the person on track with his or her routine. Try to identify areas where the person may need help. Be supportive, patient, calm, and encouraging. Gently remind the person that adjusting to changes takes time. Help with the tasks that the person has asked for help with. Keep the person involved in daily tasks and decisions as much as possible. Encourage conversation, but try not to get frustrated if the person struggles to find words or does not seem to appreciate your help. How to recognize stress Look for signs of stress in yourself and in the person you are caring for. If you notice signs of stress, take steps to manage it. Symptoms of stress include: Feeling anxious, irritable, frustrated, or angry. Denying that the person has dementia or that his or her symptoms will not improve. Feeling depressed, hopeless, or unappreciated. Difficulty sleeping. Difficulty concentrating. Developing stress-related health problems. Feeling like you have too little time for your own life. Follow these instructions at home: Take care of your health Make sure that you and the person you are caring for: Get regular sleep. Exercise regularly. Eat regular, nutritious meals. Take  over-the-counter and prescription medicines only as told by your health care providers. Drink enough fluid to keep your urine pale yellow. Attend all scheduled health care appointments.  General instructions Join a support group with others who are caregivers. Ask about respite care resources. Respite care can provide short-term care for the person so that you can have a regular break from the stress of caregiving. Consider any safety risks and take steps to avoid them. Organize medicines in a pill box for each day of the week. Create a plan to handle any legal or financial matters. Get legal or financial advice if needed. Keep a calendar in a central location to remind the person of appointments or other activities. Where to find support: Many individuals and organizations offer support. These include: Support groups for people with dementia. Support groups for caregivers. Counselors or therapists. Home health care services. Adult day care centers. Where to find more information Centers for Disease Control and Prevention: http://www.wolf.info/ Alzheimer's Association: [employer] Family Caregiver Alliance: www.caregiver.[place]: www.alzfdn.org Contact a health care provider if: The person's health is rapidly getting worse. You are no longer able to care for the person. Caring for the person is affecting your physical and emotional health. You are feeling depressed or anxious about caring for the person. Get help right away if: The person threatens himself or herself, you, or anyone else. You feel depressed or sad, or feel that you want to harm yourself. If you ever feel like  your loved one may hurt himself or herself or others, or if he or she shares thoughts about taking his or her own life, get help right away. You can go to your nearest emergency department or: Call your local emergency services (911 in the [place] Call a suicide crisis helpline, such as the  [place] at (724)263-3864 or 988 in the [place]. This is open 24 hours a day in the [place] Text the Crisis Text Line at 3801204777 (in the [place].). Summary Dementia is a term used to describe a number of symptoms that affect memory and thinking. Dementia usually gets worse slowly over time. Take steps to reduce the person's risk of injury and to plan for future care. Caregivers need support, relief from caregiving, and time for their own lives. This information is not intended to replace advice given to you by your health care provider. Make sure you discuss any questions you have with your health care provider. Document Revised: 05/25/2021 Document Reviewed: 03/15/2020 Elsevier Patient Education  [place].

## 2021-11-28 NOTE — Telephone Encounter (Signed)
Health team order sent to [facility] they will reach out to the patient to schedule.

## 2021-11-28 NOTE — Progress Notes (Addendum)
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 natural pupil- ptosis, after nasal septum repaired. Funduscopic exam deferred   Extraocular movements in vertical and horizontal planes were full  and without nystagmus.  No Diplopia.  PNP is possible. Right ptosis is related to scar surgical formation-  Facial droop.  Visual fields by finger perimetry are intact. Hearing impaired - has hearing aids in situ.  Facial sensation to fine touch is impaired in the left face- radiation and surgery.  Facial motor strength is asymmetric  - the tongue  moved midline.   Uvula was deviated to the left, appears to stick to the pillar. .  Neck ROM : had neck resection -  rotation, tilt and flexion extension were normal for age and shoulder shrug  was symmetrical.    Motor exam:   Noted is increased rigor over both biceps but no cogwheeling.  Unable to stand without drifting to the right. Retropulsion.    Sensory:  Fine  touch and vibration were normal.  Proprioception tested in the upper extremities was normal. Patient cannot answer promptly.    Coordination: Rapid alternating movements in the fingers/hands were deferred.   Gait and station: Patient could not rise from a seated position . Today in a wheelchair.  He walks very stiffly, unsteady- he turns with 5 steps,  Relies on a walker.   Deep tendon reflexes:     After spending a total time of 40 minutes face to face and additional time for physical and neurologic examination, review of laboratory studies, ED reports and images at [facility] and at [place].  Personal review of imaging studies, reports and results of other testing and review of referral information / records as far as provided in visit, I have established the following assessments:   1) Dementia with  progression after a second traumatic  Subarachnoid hemorrhage, related to frequent falls, several head injuries in the last 2 years. Patient  resides at [facility] was 17/ 30 @ [facility]- Dr. Helaine Chess visit in April of 2022.  Here , on  05-25-2021 at only 14/ 30 points , we used MMSE to further score.  Today MOCA at 6 points!!!!  Now he has reached the stage of moderate-severe dementia.  Apraxia and falls are additionally creating need for added care. I can't pin point one type of dementia -he has not had a step wise decline, he has not have further evidence of possible NPH ( no incontinence) , he has no tremor and no cogwheeling, no a visual hallucinations- ( not PD or lewy body).  Most likely Alzheimer's type dementia.   3) severe OSA !  Attended sleep study full EEG had failed - due to Insomnia- and HST was needed. He was clearly affected. Severe OSA confirmed and treated on auto CPAP with a very good resolution, some residual centrals- not bad- and best mask fit for his special needs. He wears it 2-3 hours.  Atrial fibrillation , see dr Einar Gip consultation.    In  short, Jeffrey [name] is presenting with likely  mixed dementia, severe now- of vascular / traumatic Alzheimer's type dementia and additional decline after head injuries- frequent falls  -retropulsive more than propulsive have made the risk of falling and suffering additional SDH  more likely- he is now wheelchair bound for safety.  progression following a post traumatic SAH/ SDH on 07-05-2020 and now again in 11-09-2021.    He needs a Actuary in Environmental consultant 24/ 7 hours.   He will under go follow up CT in 10-14 days for bleed.   I plan to follow up within 3-6 month.    Electronically signed by: Larey Seat, MD 11/28/2021 11:50 AM  [facility] Board certified by The [employer] of Sleep Medicine and Diplomate of the [employer] of Sleep Medicine. Board certified In Neurology through the [place], Fellow of the [employer] of Neurology. Medical Director of [employer] [facility].

## 2021-11-29 ENCOUNTER — Non-Acute Institutional Stay (SKILLED_NURSING_FACILITY): Payer: PPO | Admitting: Orthopedic Surgery

## 2021-11-29 ENCOUNTER — Other Ambulatory Visit: Payer: Self-pay

## 2021-11-29 ENCOUNTER — Emergency Department (HOSPITAL_COMMUNITY)
Admission: EM | Admit: 2021-11-29 | Discharge: 2021-11-29 | Disposition: A | Discharge status: Home / Self Care | Payer: PPO | Attending: Emergency Medicine | Admitting: Emergency Medicine

## 2021-11-29 ENCOUNTER — Encounter: Payer: Self-pay | Admitting: Orthopedic Surgery

## 2021-11-29 ENCOUNTER — Encounter (HOSPITAL_COMMUNITY): Payer: Self-pay | Admitting: *Deleted

## 2021-11-29 DIAGNOSIS — H02102 Unspecified ectropion of right lower eyelid: Secondary | ICD-10-CM | POA: Diagnosis not present

## 2021-11-29 DIAGNOSIS — F0150 Vascular dementia without behavioral disturbance: Secondary | ICD-10-CM | POA: Diagnosis not present

## 2021-11-29 DIAGNOSIS — Z85828 Personal history of other malignant neoplasm of skin: Secondary | ICD-10-CM | POA: Insufficient documentation

## 2021-11-29 DIAGNOSIS — S065XAA Traumatic subdural hemorrhage with loss of consciousness status unknown, initial encounter: Secondary | ICD-10-CM | POA: Diagnosis not present

## 2021-11-29 DIAGNOSIS — G309 Alzheimer's disease, unspecified: Secondary | ICD-10-CM | POA: Diagnosis not present

## 2021-11-29 DIAGNOSIS — F0280 Dementia in other diseases classified elsewhere without behavioral disturbance: Secondary | ICD-10-CM

## 2021-11-29 DIAGNOSIS — N183 Chronic kidney disease, stage 3 unspecified: Secondary | ICD-10-CM | POA: Insufficient documentation

## 2021-11-29 DIAGNOSIS — R339 Retention of urine, unspecified: Secondary | ICD-10-CM

## 2021-11-29 DIAGNOSIS — T83028A Displacement of other indwelling urethral catheter, initial encounter: Secondary | ICD-10-CM | POA: Insufficient documentation

## 2021-11-29 DIAGNOSIS — T83098A Other mechanical complication of other indwelling urethral catheter, initial encounter: Secondary | ICD-10-CM | POA: Diagnosis not present

## 2021-11-29 DIAGNOSIS — Z79899 Other long term (current) drug therapy: Secondary | ICD-10-CM | POA: Insufficient documentation

## 2021-11-29 DIAGNOSIS — H5789 Other specified disorders of eye and adnexa: Secondary | ICD-10-CM

## 2021-11-29 DIAGNOSIS — I129 Hypertensive chronic kidney disease with stage 1 through stage 4 chronic kidney disease, or unspecified chronic kidney disease: Secondary | ICD-10-CM | POA: Insufficient documentation

## 2021-11-29 DIAGNOSIS — R58 Hemorrhage, not elsewhere classified: Secondary | ICD-10-CM | POA: Diagnosis not present

## 2021-11-29 DIAGNOSIS — T83021A Displacement of indwelling urethral catheter, initial encounter: Secondary | ICD-10-CM

## 2021-11-29 DIAGNOSIS — I10 Essential (primary) hypertension: Secondary | ICD-10-CM | POA: Diagnosis not present

## 2021-11-29 HISTORY — DX: Unspecified dementia, unspecified severity, without behavioral disturbance, psychotic disturbance, mood disturbance, and anxiety: F03.90

## 2021-11-29 HISTORY — DX: Retention of urine, unspecified: R33.9

## 2021-11-29 HISTORY — DX: Unspecified atrial fibrillation: I48.91

## 2021-11-29 NOTE — ED Notes (Signed)
Pt leaves by [facility], sitter has updated his wife.  Foley bag draining.

## 2021-11-29 NOTE — ED Triage Notes (Signed)
Pt arrives by ems after pulling his foley catheter out.  EMS reports that there was initially lots of bleeding from penis which has stopped.  Pt is resting and in no distress on arrival. Caregiver is also with pt.

## 2021-11-29 NOTE — ED Provider Notes (Signed)
[facility] DEPT Provider Note  CSN: [id number] Arrival date & time: 11/29/21 0231  Chief Complaint(s) No chief complaint on file.  HPI Jeffrey [name] is a 81 y.o. male with a past medical history listed below including dementia and urinary retention requiring Foley catheter placement on January 14 who presents to the emergency department after he pulled out his Foley catheter.  There was noted bleeding.  Patient is not anticoagulated.  The history is provided by a caregiver.   Past Medical History Past Medical History:  Diagnosis Date   Atrial fibrillation [facility])    Colon polyps    Dementia ([place])    Diverticulosis    Hypercholesterolemia    Hypertension    Internal hemorrhoids    Urinary retention    Patient Active Problem List   Diagnosis Date Noted   Severe vascular dementia with agitation 11/28/2021   Paroxysmal atrial fibrillation ([address]) 11/28/2021   Traumatic brain injury with loss of consciousness of less than 1 hour ([place]) 11/28/2021   Subdural hematoma 11/22/2021   Bilateral subdural hematomas 11/21/2021   Dysphagia 11/21/2021   Ectropion of right lower eyelid 11/21/2021   CKD (chronic kidney disease), stage III ([place]) 11/21/2021   Alzheimer's dementia ([place]) 05/25/2021   Apraxia on examination 05/25/2021   Frequent falls 05/25/2021   PSVT (paroxysmal supraventricular tachycardia) ([place]) 12/22/2020   SAH (subarachnoid hemorrhage) ([place]) 09/30/2020   possible syncope and collapse 02/17/2020   Bradycardia on ECG 02/17/2020   Hypersomnia with sleep apnea 02/17/2020   Carotid artery disease ([place]) 11/28/2016   Hyperkalemia 10/26/2014   HYPERGLYCEMIA 11/10/2008   LOCALIZED SUPERFICIAL SWELLING MASS OR LUMP 03/31/2008   MELANOMA OF SKIN, SITE UNSPECIFIED 02/11/2008   Hyperlipidemia 02/11/2008   Essential hypertension 02/11/2008   HYPERPLASIA PROSTATE UNS W/O UR OBST & OTH LUTS 02/11/2008   SKIN CANCER, HX OF 02/11/2008   GEN  OSTEOARTHROSIS INVOLVING MULTIPLE SITES 10/03/2007   PES PLANUS 10/03/2007   Home Medication(s) Prior to Admission medications   Medication Sig Start Date End Date Taking? Authorizing Provider  cephALEXin (KEFLEX) 500 MG capsule Take 500 mg by mouth 2 (two) times daily. For 3 days    [provider]  citalopram (CELEXA) 10 MG tablet Take 1 tablet (10 mg total) by mouth daily. 12/22/20   Dohmeier, Asencion Partridge, MD  ketoconazole (NIZORAL) 2 % shampoo Apply 1 application topically 3 (three) times a week. 06/06/21   [provider]  metoprolol succinate (TOPROL-XL) 50 MG 24 hr tablet Take 1 tablet (50 mg total) by mouth daily. Take with or immediately following a meal. 11/25/21   Shelly Coss, MD  niacinamide 500 MG tablet Take 500 mg by mouth 2 (two) times daily with a meal.    [provider]  ofloxacin (OCUFLOX) 0.3 % ophthalmic solution Place 1 drop into the right eye 2 (two) times daily. 10/13/21   [provider]  ramipril (ALTACE) 2.5 MG capsule Take 2.5 mg by mouth daily. 11/11/19   [provider]  rivastigmine (EXELON) 4.6 mg/24hr Place 1 patch (4.6 mg total) onto the skin daily. 05/25/21   Dohmeier, Asencion Partridge, MD  tamsulosin (FLOMAX) 0.4 MG CAPS capsule Take 1 capsule (0.4 mg total) by mouth daily. 11/25/21   Shelly Coss, MD  triamcinolone cream (KENALOG) 0.1 % Apply 1 application topically daily. 10/19/21   [provider]  valACYclovir (VALTREX) 500 MG tablet Take 500 mg by mouth 2 (two) times daily as needed. Flare up 08/15/21   [provider]  Allergies Donepezil and Other  Review of Systems Review of Systems As noted in HPI  Physical Exam Vital Signs  I have reviewed the triage vital signs BP (!) 158/84 (BP Location: Left Arm)    Pulse 71    Temp 98.1 F (36.7 C) (Oral)    Resp 16    SpO2 95%    Physical Exam Vitals reviewed. Exam conducted with a chaperone present.  Constitutional:      General: He is not in acute distress.    Appearance: He is well-developed. He is not diaphoretic.  HENT:     Head: Normocephalic and atraumatic.     Right Ear: External ear normal.     Left Ear: External ear normal.     Nose: Nose normal.     Mouth/Throat:     Mouth: Mucous membranes are moist.  Eyes:     General: No scleral icterus.    Conjunctiva/sclera: Conjunctivae normal.  Neck:     Trachea: Phonation normal.  Cardiovascular:     Rate and Rhythm: Normal rate and regular rhythm.  Pulmonary:     Effort: Pulmonary effort is normal. No respiratory distress.     Breath sounds: No stridor.  Abdominal:     General: There is no distension.  Genitourinary:    Comments: Red blood from urethral meatus Musculoskeletal:        General: Normal range of motion.     Cervical back: Normal range of motion.  Neurological:     Mental Status: He is alert and oriented to person, place, and time.  Psychiatric:        Behavior: Behavior normal.    ED Results and Treatments Labs (all labs ordered are listed, but only abnormal results are displayed) Labs Reviewed - No data to display                                                                                                                       EKG  EKG Interpretation  Date/Time:    Ventricular Rate:    PR Interval:    QRS Duration:   QT Interval:    QTC Calculation:   R Axis:     Text Interpretation:         Radiology No results found.  Pertinent labs & imaging results that were available during my care of the patient were reviewed by me and considered in my medical decision making (see MDM for details).  Medications Ordered in ED Medications - No data to display  Procedures BLADDER  CATHETERIZATION  Date/Time: 11/29/2021 3:47 AM Performed by: Fatima Blank, MD Authorized by: Fatima Blank, MD   Consent:    Consent obtained:  Verbal   Consent given by:  Healthcare agent   Risks discussed:  False passage, incomplete procedure and urethral injury Universal protocol:    Patient identity confirmed:  Arm band Pre-procedure details:    Procedure purpose:  Therapeutic   Preparation: Patient was prepped and draped in usual sterile fashion   Procedure details:    Provider performed due to:  Complicated insertion   Catheter insertion:  Indwelling   Catheter type:  Coude   Catheter size:  16 Fr   Bladder irrigation: no     Number of attempts:  1   Urine characteristics:  Bloody Post-procedure details:    Procedure completion:  Tolerated  (including critical care time)  Medical Decision Making / ED Course        Urinary retention previously requiring Foley placement.  Dislodged Foley. None review of records urology had to be consulted during last visit but was able to place a 26 [place] coud Initial attempt of Foley placement by nursing unsuccessful   Management: I personally inserted a 16 [place] coud without complication   Final Clinical Impression(s) / ED Diagnoses Final diagnoses:  Dislodged Foley catheter [facility])   The patient appears reasonably screened and/or stabilized for discharge and I doubt any other medical condition or other [facility] requiring further screening, evaluation, or treatment in the ED at this time prior to discharge. Safe for discharge with strict return precautions.  Disposition: Discharge  Condition: Good  I have discussed the results, Dx and Tx plan with the patient/family who expressed understanding and agree(s) with the plan. Discharge instructions discussed at length. The patient/family was given strict return precautions who verbalized understanding of the instructions. No further questions at time of discharge.     ED Discharge Orders     None       Follow Up: Urology   as scheduled  Crist Infante, MD [place] [id number] [phone]  Call  as needed           This chart was dictated using voice recognition software.  Despite best efforts to proofread,  errors can occur which can change the documentation meaning.    Fatima Blank, MD 11/29/21 [phone]

## 2021-11-29 NOTE — ED Notes (Signed)
Call to [facility] to check and see if they can provide transport back to facility and to give report to [employer]

## 2021-11-29 NOTE — ED Notes (Signed)
[facility] arrived here to pick pt up but sitter at the bedside advised them that his wife is coming to get him

## 2021-11-29 NOTE — Progress Notes (Signed)
Location:  [facility] Room Number: 144 Place of Service:  SNF (31) Provider:  Windell Moulding, NP  Crist Infante, MD  Patient Care Team: Crist Infante, MD as PCP - General (Internal Medicine)  Extended Emergency Contact Information Primary Emergency Contact: Vayas Mobile Phone: [phone] Relation: Spouse Secondary Emergency Contact: Joni Reining Mobile Phone: [phone] Relation: Daughter  Code Status: DNR  Goals of care: Advanced Directive information Advanced Directives 11/29/2021  Does Patient Have a Medical Advance Directive? Yes  Type of [place];Living will;Out of facility DNR (pink MOST or yellow form)  Does patient want to make changes to medical advance directive? Yes (ED - send information to MyChart)  Copy of [place] in Chart? Yes - validated most recent copy scanned in chart (See row information)  Would patient like information on creating a medical advance directive? -  Pre-existing out of facility DNR order (yellow form or pink MOST form) Yellow form placed in chart (order not valid for inpatient use)     Chief Complaint  Patient presents with   Acute Visit    Dry eyes   Health Maintenance    COVID booster, Flu vaccine    HPI:  Pt is a 81 y.o. male seen today for an acute visit for green eye drainage and hematuria.    He currently resides on the rehab unit at [facility] PMH: CAD, HTN, atrial fib, PSVT, dysphagia, alzheimer's, melanoma, CKD, HLD and falls,   01/14 he was found unconscious by his wife. Hospitalized 01/09-01/13, CT head revealed bilateral subdural hematomas. Mental status improved during hospitalization. 01/16 seen by neuro, MOCA score now 6, was 17/30 02/2021. Recent decline due to mixed dementia vascular/Alzheimer's dementia. F/u CT head recommended in 2 weeks, advised to follow up in 3-6 months.   01/14 he was diagnosed with urinary  retention. Foley placed in ED. 01/16 he pulled his foley out in the early morning. Nursing staff was unable to reinsert foley. He was sent to ED, 17F coude placed. Urine tea colored. He was given Keflex x 3 days for UTI prevention. Scheduled to see urology 12/02/2021. Due to confusion he has been observed pulling at foley, family has had sitter with him. Wife has also been staying with him in rehab.   10/13/2021 he was seen by Dr. Gershon Crane for ongoing right eye pain and ectropion of lower lid. Hx right sided mohs surgery with reconstruction due to melanoma 2019. Hx right sided browlift 02/2021. He was started on Ofloxacin gtts due to green discharge. He has tried tobramycin and dexamethasone gtts in past. Wife reports cleaning eyes a few times daily, drainage is always green. I discussed treatment options with Dr. Gershon Crane office, suspect drainage may be normal. Plan to stop Ofloxacin and start Maxitrol drops prn, continue artificial tears 4x/day, Genteal ointment qhs.     Past Medical History:  Diagnosis Date   Atrial fibrillation ([place])    Colon polyps    Dementia ([place])    Diverticulosis    Hypercholesterolemia    Hypertension    Internal hemorrhoids    Urinary retention    Past Surgical History:  Procedure Laterality Date   EYE SURGERY     X 4   HERNIA REPAIR  2006   abdominal   SKIN CANCER EXCISION     shoulder, basal cell   SQUAMOUS CELL CARCINOMA EXCISION     preauricular on the right   TONSILLECTOMY  1947   VASECTOMY  1981    Allergies  Allergen Reactions   Donepezil Other (See Comments)    Per wife "has not done well."   Other Other (See Comments)    Vicryl sutures, pte. Doesn't know what reaction he had, "Drs. Owens Shark + Yeatts deetrmined not to use it on me"    Allergies as of 11/29/2021       Reactions   Donepezil Other (See Comments)   Per wife "has not done well."   Other Other (See Comments)   Vicryl sutures, pte. Doesn't know what reaction he had, "Drs. Owens Shark +  Yeatts deetrmined not to use it on me"        Medication List        Accurate as of November 29, 2021  2:29 PM. If you have any questions, ask your nurse or doctor.          acetaminophen 325 MG tablet Commonly known as: TYLENOL Take 650 mg by mouth every 4 (four) hours as needed.   cephALEXin 500 MG capsule Commonly known as: KEFLEX Take 500 mg by mouth 2 (two) times daily. For 3 days   citalopram 10 MG tablet Commonly known as: CELEXA Take 1 tablet (10 mg total) by mouth daily.   ketoconazole 2 % shampoo Commonly known as: NIZORAL Apply 1 application topically 3 (three) times a week.   metoprolol succinate 50 MG 24 hr tablet Commonly known as: TOPROL-XL Take 1 tablet (50 mg total) by mouth daily. Take with or immediately following a meal.   niacinamide 500 MG tablet Take 500 mg by mouth 2 (two) times daily with a meal.   ofloxacin 0.3 % ophthalmic solution Commonly known as: OCUFLOX Place 1 drop into the right eye 2 (two) times daily.   ramipril 2.5 MG capsule Commonly known as: ALTACE Take 2.5 mg by mouth daily.   rivastigmine 4.6 mg/24hr Commonly known as: EXELON Place 1 patch (4.6 mg total) onto the skin daily.   tamsulosin 0.4 MG Caps capsule Commonly known as: FLOMAX Take 1 capsule (0.4 mg total) by mouth daily.   triamcinolone cream 0.1 % Commonly known as: KENALOG Apply 1 application topically daily.   valACYclovir 500 MG tablet Commonly known as: VALTREX Take 500 mg by mouth 2 (two) times daily as needed. Flare up        Review of Systems  Unable to perform ROS: Dementia   Immunization History  Administered Date(s) Administered   Influenza Split 05/02/2010, 07/31/2011, 09/20/2012, 11/13/2012, 10/26/2014   Influenza, High Dose Seasonal PF 10/26/2014, 10/06/2015   Influenza, Quadrivalent, Recombinant, Inj, Pf 08/21/2018, 08/16/2020   Influenza-Unspecified 10/06/2015, 08/16/2017   PFIZER(Purple Top)SARS-COV-2 Vaccination 12/25/2019    Pneumococcal Conjugate-13 10/24/2013   Pneumococcal Polysaccharide-23 01/29/2003, 10/26/2009   Td,absorbed, Preservative Free, Adult Use, Lf Unspecified 05/23/2004, 11/20/2012   Tdap 10/26/2014, 06/03/2019   Unspecified SARS-COV-2 Vaccination 11/25/2019   Zoster Recombinat (Shingrix) 12/27/2017, 02/26/2018   Zoster, Live 12/04/2007, 02/26/2018   Pertinent  Health Maintenance Due  Topic Date Due   INFLUENZA VACCINE  06/13/2021   Fall Risk 11/24/2021 11/25/2021 11/25/2021 11/26/2021 11/29/2021  Falls in the past year? - - - - -  Was there an injury with Fall? - - - - -  Fall Risk Category Calculator - - - - -  Fall Risk Category - - - - -  Patient Fall Risk Level High fall risk High fall risk High fall risk High fall risk High fall risk   Functional Status Survey:    Vitals:  11/29/21 1309  BP: 136/85  Pulse: 71  Resp: 16  Temp: (!) 97.5 F (36.4 C)  SpO2: 95%  Height: 6' (1.829 m)   Body mass index is 21.56 kg/m. Physical Exam Vitals reviewed.  Constitutional:      General: He is not in acute distress. HENT:     Head: Normocephalic.     Ears:     Comments: Surgical scar to right ear/temple.  Eyes:     General:        Right eye: Discharge present.        Left eye: Discharge present.    Conjunctiva/sclera: Conjunctivae normal.     Right eye: Exudate present. No hemorrhage.    Left eye: Exudate present. No hemorrhage. Cardiovascular:     Rate and Rhythm: Normal rate. Rhythm irregular.     Pulses: Normal pulses.     Heart sounds: Murmur heard.  Pulmonary:     Effort: Pulmonary effort is normal. No respiratory distress.     Breath sounds: Normal breath sounds. No wheezing.  Abdominal:     General: Bowel sounds are normal. There is no distension.     Palpations: Abdomen is soft.     Tenderness: There is no abdominal tenderness.  Genitourinary:    Penis: Normal.      Comments: Foley present, 200cc tea colored urine in collection bag,  Musculoskeletal:     Right  lower leg: No edema.     Left lower leg: No edema.  Skin:    General: Skin is warm and dry.     Capillary Refill: Capillary refill takes less than 2 seconds.  Neurological:     General: No focal deficit present.     Mental Status: He is easily aroused. Mental status is at baseline.     Motor: Weakness present.     Gait: Gait abnormal.  Psychiatric:        Mood and Affect: Mood normal.        Behavior: Behavior normal.        Cognition and Memory: Memory is impaired.     Comments: nonverbal    Labs reviewed: Recent Labs    11/23/21 0349 11/23/21 2328 11/24/21 0203  NA 138 132* 133*  K 4.2 3.6 3.6  CL 106 97* 99  CO2 23 24 24   GLUCOSE 101* 152* 142*  BUN 15 15 15   CREATININE 1.00 1.04 0.94  CALCIUM 8.5* 8.5* 8.6*  MG  --  1.8  --    Recent Labs    03/05/21 1008  AST 16  ALT 16  ALKPHOS 50  BILITOT 1.1  PROT 6.3*  ALBUMIN 4.1   Recent Labs    03/05/21 1008 11/21/21 1050 11/22/21 1402 11/23/21 0349 11/24/21 0203  WBC 8.5 11.1* 12.1* 10.0 12.5*  NEUTROABS 7.5 9.1*  --   --  10.2*  HGB 15.8 17.7* 15.9 15.9 14.4  HCT 47.5 54.0* 48.7 48.4 43.4  MCV 90.8 93.3 90.9 92.0 90.4  PLT 152 198 199 181 168   Lab Results  Component Value Date   TSH 0.476 11/21/2021   Lab Results  Component Value Date   HGBA1C 5.2 11/10/2008   Lab Results  Component Value Date   CHOL 125 02/04/2008   HDL 42.4 02/04/2008   LDLCALC 77 02/04/2008   TRIG 28 02/04/2008   CHOLHDL 2.9 CALC 02/04/2008    Significant Diagnostic Results in last 30 days:  DG Wrist 2 Views Right  Result Date: 11/21/2021 CLINICAL DATA:  Fall, dementia EXAM: RIGHT WRIST - 2 VIEW COMPARISON:  None. FINDINGS: There is no evidence of fracture or dislocation. There is no evidence of arthropathy or other focal bone abnormality. Soft tissues are unremarkable. IMPRESSION: Negative. Electronically Signed   By: Keane Police D.O.   On: 11/21/2021 16:19   DG Wrist Complete Right  Result Date: 11/21/2021 CLINICAL  DATA:  Trauma, pain EXAM: RIGHT WRIST - COMPLETE 3+ VIEW COMPARISON:  None. FINDINGS: No recent fracture or dislocation is seen. Degenerative changes are noted with bony spurs in first carpometacarpal joint and the interphalangeal joint of thumb. Minimal bony spurs seen in the lateral radiocarpal joint. IMPRESSION: No recent fracture or dislocation is seen in the right wrist. Electronically Signed   By: Elmer Picker M.D.   On: 11/21/2021 11:29   CT HEAD WO CONTRAST (5MM)  Result Date: 11/23/2021 CLINICAL DATA:  Stroke follow-up. EXAM: CT HEAD WITHOUT CONTRAST TECHNIQUE: Contiguous axial images were obtained from the base of the skull through the vertex without intravenous contrast. RADIATION DOSE REDUCTION: This exam was performed according to the departmental dose-optimization program which includes automated exposure control, adjustment of the mA and/or kV according to patient size and/or use of iterative reconstruction technique. COMPARISON:  CT head 11/22/2021 FINDINGS: Brain: A parenchymal hematoma inferiorly in the posterior left temporal lobe has mildly increased in size, measuring 3.0 x 5.9 x 2.4 cm (estimated volume of 21 mL). Mild surrounding edema is unchanged, as is mass effect on the temporal horn of the left lateral ventricle. There is trace hemorrhage in the occipital horns of the lateral ventricles which has decreased. Minimal hemorrhage along the posterior aspect of the third ventricle/medial margin of the right thalamus is unchanged. Minimal subarachnoid hemorrhage near the posterior aspect of the left sylvian fissure is unchanged. Small subdural hematomas over the cerebral convexities are unchanged and measure up to 8 mm in thickness on the right and 3 mm on the left. A small amount of subdural blood along the tentorium bilaterally is also unchanged. There is no significant midline shift. There is no evidence of an acute infarct, new hemorrhage, or hydrocephalus. Patchy hypodensities in  the cerebral white matter bilaterally are unchanged and nonspecific but compatible with moderate chronic small vessel ischemic disease. There is mild cerebral atrophy. Vascular: Calcified atherosclerosis at the skull base. Skull: No acute fracture or suspicious osseous lesion. Sinuses/Orbits: Unchanged large right mastoid effusion. Clear paranasal sinuses. Bilateral cataract extraction. Right eyelid weight. Other: Posterior scalp skin staples with mild swelling/contusion. Postoperative changes in the right parotid region. IMPRESSION: 1. Mild enlargement of the left temporal lobe hematoma. Unchanged mild edema. 2. Unchanged small subdural hematomas and minimal subarachnoid hemorrhage. 3. Decreased intraventricular hemorrhage. Electronically Signed   By: Logan Bores M.D.   On: 11/23/2021 13:31   CT HEAD WO CONTRAST (5MM)  Result Date: 11/22/2021 CLINICAL DATA:  Intracranial bleeding, trauma EXAM: CT HEAD WITHOUT CONTRAST TECHNIQUE: Contiguous axial images were obtained from the base of the skull through the vertex without intravenous contrast. COMPARISON:  11/21/2021 FINDINGS: Brain: There is interval appearance of large lobulated intra cerebral hematoma measuring a proximally 4.7 x 2.9 x 2.8 cm in the left posterior temporal lobe. Estimated volume is approximately 19 mL. There is surrounding edema. There is 6 mm area of bleeding in the right posterior third ventricle. There is interval layering of small amount of blood in the dependent portions of occipital lobes in both lateral ventricles. Small 3 mm subdural hematoma seen in the periphery of  left cerebral hemisphere is less evident. There is increase in amount of CSF density adjacent to both frontal lobes, more so on the right side measuring up to 10 mm in the diameter. There is no shift of midline structures. There is increased density between the leaves tentorium with interval worsening more so on the left side. There is subtle increased density in the  posterior right sylvian fissure, possibly presence of subarachnoid hemorrhage. Similar finding to a lesser extent is seen in the posterior left sylvian fissure. Vascular: Scattered arterial calcifications are seen. Skull: No displaced fracture is seen. Sinuses/Orbits: Sinuses are unremarkable. There is a metallic density in the anterior right orbit. IMPRESSION: There is interval appearance of 4.7 x 2.9 x 2.8 cm lobulated intra cerebral hematoma in the posterior left temporal lobe. There is interval increase in the amount of blood in the third and both lateral ventricles. There is increased amount of blood between the leaves of the tentorium, more so on the left side. There is subtle increased density in the posterior aspects of sylvian fissure on both sides, more so on the right side possibly suggesting subarachnoid hemorrhage. 3 mm acute left subdural hematoma seen in the previous examination appears less prominent. There is increased amount of CSF density in the periphery of both frontal lobes, more so on the right side measuring up to 10 mm in thickness without definite evidence of recent bleeding. Imaging findings were relayed to Dr. Tyrell Antonio by telephone call. She will contact neurosurgeon. Electronically Signed   By: Elmer Picker M.D.   On: 11/22/2021 11:41   CT Head Wo Contrast  Addendum Date: 11/21/2021   ADDENDUM REPORT: 11/21/2021 11:59 ADDENDUM: Posterior scalp laceration. Electronically Signed   By: Logan Bores M.D.   On: 11/21/2021 11:59   Result Date: 11/21/2021 CLINICAL DATA:  Head trauma.  Fall today.  Head and neck pain. EXAM: CT HEAD WITHOUT CONTRAST CT CERVICAL SPINE WITHOUT CONTRAST TECHNIQUE: Multidetector CT imaging of the head and cervical spine was performed following the standard protocol without intravenous contrast. Multiplanar CT image reconstructions of the cervical spine were also generated. COMPARISON:  CT head and cervical spine 03/05/2021 FINDINGS: CT HEAD FINDINGS Brain:  A new small hyperdense subdural hematoma over the left lateral cerebral convexity measures up to 3 mm in thickness without mass effect. Trace subdural hemorrhage is also present along the posterior aspect of the falx and along the left greater than right tentorial leaflets, and there is also new trace hypoattenuating subdural fluid or blood over the right cerebral convexity. There is minimal acute hemorrhage in the fourth ventricle. There is also a small extra-axial focus of curvilinear hyperdensity along the posterolateral aspect of third ventricle coursing towards the pineal region (series 4, image 16). No acute cortically based infarct or midline shift is evident. There is mild cerebral atrophy. Patchy hypodensities in the cerebral white matter bilaterally are unchanged and nonspecific but compatible with moderate chronic small vessel ischemic disease. Vascular: Calcified atherosclerosis at the skull base. Skull: No acute fracture or suspicious osseous lesion. Sinuses/Orbits: Clear paranasal sinuses. Unchanged large right mastoid effusion. Right eyelid weight. Bilateral cataract extraction. Other: Postoperative changes involving the right parotid gland. CT CERVICAL SPINE FINDINGS Alignment: Chronic trace anterolisthesis of C7 on T1. Skull base and vertebrae: No acute fracture or suspicious osseous lesion. Soft tissues and spinal canal: No prevertebral fluid or swelling. No visible canal hematoma. Disc levels: Multilevel disc degeneration, moderate at C6-7. Asymmetrically advanced right-sided cervical facet arthrosis at multiple levels. Right  facet ankylosis at C2-3. Moderate multilevel neural foraminal stenosis and at least mild multilevel spinal stenosis. Upper chest: Clear lung apices. Other: Postoperative changes in the right upper neck. IMPRESSION: 1. Small acute bilateral subdural hematomas without mass effect. 2. Small volume acute hemorrhage in the fourth ventricle. 3. Small hyperdensity along the  posterolateral aspect of the third ventricle on the right, likely additional extra-axial hemorrhage although deep cerebral vein thrombosis is an alternative consideration. 4. Moderate chronic small vessel ischemic disease. 5. No acute cervical spine fracture. Critical Value/emergent results were called by telephone at the time of interpretation on 11/21/2021 at 11:47 am to provider Myna Bright, who verbally acknowledged these results. Electronically Signed: By: Logan Bores M.D. On: 11/21/2021 11:54   CT Cervical Spine Wo Contrast  Addendum Date: 11/21/2021   ADDENDUM REPORT: 11/21/2021 11:59 ADDENDUM: Posterior scalp laceration. Electronically Signed   By: Logan Bores M.D.   On: 11/21/2021 11:59   Result Date: 11/21/2021 CLINICAL DATA:  Head trauma.  Fall today.  Head and neck pain. EXAM: CT HEAD WITHOUT CONTRAST CT CERVICAL SPINE WITHOUT CONTRAST TECHNIQUE: Multidetector CT imaging of the head and cervical spine was performed following the standard protocol without intravenous contrast. Multiplanar CT image reconstructions of the cervical spine were also generated. COMPARISON:  CT head and cervical spine 03/05/2021 FINDINGS: CT HEAD FINDINGS Brain: A new small hyperdense subdural hematoma over the left lateral cerebral convexity measures up to 3 mm in thickness without mass effect. Trace subdural hemorrhage is also present along the posterior aspect of the falx and along the left greater than right tentorial leaflets, and there is also new trace hypoattenuating subdural fluid or blood over the right cerebral convexity. There is minimal acute hemorrhage in the fourth ventricle. There is also a small extra-axial focus of curvilinear hyperdensity along the posterolateral aspect of third ventricle coursing towards the pineal region (series 4, image 16). No acute cortically based infarct or midline shift is evident. There is mild cerebral atrophy. Patchy hypodensities in the cerebral white matter bilaterally are  unchanged and nonspecific but compatible with moderate chronic small vessel ischemic disease. Vascular: Calcified atherosclerosis at the skull base. Skull: No acute fracture or suspicious osseous lesion. Sinuses/Orbits: Clear paranasal sinuses. Unchanged large right mastoid effusion. Right eyelid weight. Bilateral cataract extraction. Other: Postoperative changes involving the right parotid gland. CT CERVICAL SPINE FINDINGS Alignment: Chronic trace anterolisthesis of C7 on T1. Skull base and vertebrae: No acute fracture or suspicious osseous lesion. Soft tissues and spinal canal: No prevertebral fluid or swelling. No visible canal hematoma. Disc levels: Multilevel disc degeneration, moderate at C6-7. Asymmetrically advanced right-sided cervical facet arthrosis at multiple levels. Right facet ankylosis at C2-3. Moderate multilevel neural foraminal stenosis and at least mild multilevel spinal stenosis. Upper chest: Clear lung apices. Other: Postoperative changes in the right upper neck. IMPRESSION: 1. Small acute bilateral subdural hematomas without mass effect. 2. Small volume acute hemorrhage in the fourth ventricle. 3. Small hyperdensity along the posterolateral aspect of the third ventricle on the right, likely additional extra-axial hemorrhage although deep cerebral vein thrombosis is an alternative consideration. 4. Moderate chronic small vessel ischemic disease. 5. No acute cervical spine fracture. Critical Value/emergent results were called by telephone at the time of interpretation on 11/21/2021 at 11:47 am to provider Myna Bright, who verbally acknowledged these results. Electronically Signed: By: Logan Bores M.D. On: 11/21/2021 11:54   ECHOCARDIOGRAM COMPLETE  Result Date: 11/22/2021    ECHOCARDIOGRAM REPORT   Patient Name:   CHARLIES  Coralee North Date of Exam: 11/22/2021 Medical Rec #:  [id number]        Height:       72.0 in Accession #:    [id number]       Weight:       170.5 lb Date of Birth:  Aug 14, 1941         BSA:          1.991 m Patient Age:    33 years         BP:           107/67 mmHg Patient Gender: M                HR:           82 bpm. Exam Location:  Inpatient Procedure: 2D Echo, Cardiac Doppler and Color Doppler Indications:    syncope  History:        Patient has prior history of Echocardiogram examinations, most                 recent 08/29/2019. Risk Factors:Hypertension. Hyperlipidemia.  Sonographer:    Beryle Beams Referring Phys: [id number] [place]  1. Left ventricular ejection fraction, by estimation, is 55 to 60%. The left ventricle has normal function. Left ventricular endocardial border not optimally defined to evaluate regional wall motion. There may be hypokinesis of the apical segments of the inferior wall and inferior septum. Left ventricular diastolic parameters are consistent with Grade I diastolic dysfunction (impaired relaxation).  2. Right ventricular systolic function is normal. The right ventricular size is normal.  3. The mitral valve is normal in structure. No evidence of mitral valve regurgitation. No evidence of mitral stenosis.  4. The aortic valve is normal in structure. Aortic valve regurgitation is not visualized. No aortic stenosis is present.  5. The inferior vena cava is normal in size with greater than 50% respiratory variability, suggesting right atrial pressure of 3 mmHg. FINDINGS  Left Ventricle: Left ventricular ejection fraction, by estimation, is 55 to 60%. The left ventricle has normal function. Left ventricular endocardial border not optimally defined to evaluate regional wall motion. The left ventricular internal cavity size was normal in size. There is no left ventricular hypertrophy. Left ventricular diastolic parameters are consistent with Grade I diastolic dysfunction (impaired relaxation). Indeterminate filling pressures. Right Ventricle: Ivc not seen. The right ventricular size is normal. No increase in right ventricular wall thickness. Right  ventricular systolic function is normal. Left Atrium: Left atrial size was normal in size. Right Atrium: Right atrial size was normal in size. Pericardium: Trivial pericardial effusion is present. Mitral Valve: The mitral valve is normal in structure. No evidence of mitral valve regurgitation. No evidence of mitral valve stenosis. Tricuspid Valve: The tricuspid valve is normal in structure. Tricuspid valve regurgitation is not demonstrated. No evidence of tricuspid stenosis. Aortic Valve: The aortic valve is normal in structure. Aortic valve regurgitation is not visualized. No aortic stenosis is present. Aortic valve mean gradient measures 3.0 mmHg. Aortic valve peak gradient measures 5.1 mmHg. Aortic valve area, by VTI measures 2.33 cm. Pulmonic Valve: The pulmonic valve was normal in structure. Pulmonic valve regurgitation is not visualized. No evidence of pulmonic stenosis. Aorta: The aortic root is normal in size and structure. Venous: The inferior vena cava is normal in size with greater than 50% respiratory variability, suggesting right atrial pressure of 3 mmHg. IAS/Shunts: No atrial level shunt detected by color flow Doppler.  LEFT VENTRICLE PLAX 2D LVIDd:  4.80 cm     Diastology LVIDs:         2.80 cm     LV e' medial:    4.46 cm/s LV PW:         1.10 cm     LV E/e' medial:  12.1 LV IVS:        0.70 cm     LV e' lateral:   4.46 cm/s LVOT diam:     1.80 cm     LV E/e' lateral: 12.1 LV SV:         48 LV SV Index:   24 LVOT Area:     2.54 cm  LV Volumes (MOD) LV vol d, MOD A2C: 35.3 ml LV vol d, MOD A4C: 42.3 ml LV vol s, MOD A2C: 13.3 ml LV vol s, MOD A4C: 19.1 ml LV SV MOD A2C:     22.0 ml LV SV MOD A4C:     42.3 ml LV SV MOD BP:      24.4 ml RIGHT VENTRICLE RV S prime:     9.36 cm/s TAPSE (M-mode): 2.0 cm LEFT ATRIUM           Index        RIGHT ATRIUM          Index LA diam:      2.70 cm 1.36 cm/m   RA Area:     6.72 cm LA Vol (A2C): 33.8 ml 16.98 ml/m  RA Volume:   9.86 ml  4.95 ml/m LA Vol  (A4C): 21.7 ml 10.90 ml/m  AORTIC VALVE                    PULMONIC VALVE AV Area (Vmax):    2.32 cm     PV Vmax:       0.88 m/s AV Area (Vmean):   2.60 cm     PV Peak grad:  3.1 mmHg AV Area (VTI):     2.33 cm AV Vmax:           113.00 cm/s AV Vmean:          75.100 cm/s AV VTI:            0.205 m AV Peak Grad:      5.1 mmHg AV Mean Grad:      3.0 mmHg LVOT Vmax:         103.00 cm/s LVOT Vmean:        76.700 cm/s LVOT VTI:          0.188 m LVOT/AV VTI ratio: 0.92  AORTA Ao Root diam: 2.60 cm Ao Asc diam:  2.50 cm MITRAL VALVE                TRICUSPID VALVE MV Area (PHT): 5.54 cm     TV Peak grad:   13.4 mmHg MV Decel Time: 137 msec     TV Mean grad:   11.0 mmHg MV E velocity: 53.90 cm/s   TV Vmax:        1.83 m/s MV A velocity: 103.00 cm/s  TV Vmean:       159.0 cm/s MV E/A ratio:  0.52         TV VTI:         0.58 msec                              SHUNTS  Systemic VTI:  0.19 m                             Systemic Diam: 1.80 cm Sanda Klein MD Electronically signed by Sanda Klein MD Signature Date/Time: 11/22/2021/10:29:33 AM    Final    VAS US CAROTID  Result Date: 11/22/2021 Carotid Arterial Duplex Study Patient Name:  JUELZ WHITTENBERG [facility]  Date of Exam:   11/22/2021 Medical Rec #: [id number]         Accession #:    [id number] Date of Birth: 1941/05/27         Patient Gender: M Patient Age:   28 years Exam Location:  [facility] [place] Procedure:      VAS US CAROTID Referring Phys: Orma Flaming --------------------------------------------------------------------------------  Indications:       History of carotid stenosis. Risk Factors:      Hypertension, hyperlipidemia, past history of smoking. Other Factors:     Dementia, multiple falls. Comparison Study:  09-08-2020 Most recent prior carotid duplex consistent with                    50-69% ICA stenosis bilaterally. Performing Technologist: Darlin Coco RDMS, RVT  Examination Guidelines: A complete evaluation includes B-mode  imaging, spectral Doppler, color Doppler, and power Doppler as needed of all accessible portions of each vessel. Bilateral testing is considered an integral part of a complete examination. Limited examinations for reoccurring indications may be performed as noted.  Right Carotid Findings: +----------+--------+--------+--------+------------------+---------------------+             PSV cm/s EDV cm/s Stenosis Plaque Description Comments               +----------+--------+--------+--------+------------------+---------------------+  CCA Prox   64       21                                   intimal thickening     +----------+--------+--------+--------+------------------+---------------------+  CCA Distal 50       17                hyperechoic,                                                                     heterogenous and                                                                 irregular                                 +----------+--------+--------+--------+------------------+---------------------+  ICA Prox   289      66       60-79%                                             +----------+--------+--------+--------+------------------+---------------------+  ICA Mid    287      49                                   post-stenotic                                                                    turbulence             +----------+--------+--------+--------+------------------+---------------------+  ICA Distal 54       16                                   tortuous               +----------+--------+--------+--------+------------------+---------------------+  ECA        72       22                                                          +----------+--------+--------+--------+------------------+---------------------+ +----------+--------+-------+----------------+-------------------+             PSV cm/s EDV cms Describe         Arm Pressure (mmHG)  +----------+--------+-------+----------------+-------------------+   Subclavian 155              Multiphasic, WNL                      +----------+--------+-------+----------------+-------------------+ +---------+--------+--+--------+--+---------+  Vertebral PSV cm/s 46 EDV cm/s 11 Antegrade  +---------+--------+--+--------+--+---------+  Left Carotid Findings: +----------+--------+--------+--------+-------------------+------------------+             PSV cm/s EDV cm/s Stenosis Plaque Description  Comments            +----------+--------+--------+--------+-------------------+------------------+  CCA Prox   67       15                                                        +----------+--------+--------+--------+-------------------+------------------+  CCA Distal 72       23                                    intimal thickening  +----------+--------+--------+--------+-------------------+------------------+  ICA Prox   67       21       1-39%    heterogenous , mild                     +----------+--------+--------+--------+-------------------+------------------+  ICA Mid    54       19                                                        +----------+--------+--------+--------+-------------------+------------------+  ICA Distal 58       22                                                        +----------+--------+--------+--------+-------------------+------------------+  ECA        154      27       >50%                                             +----------+--------+--------+--------+-------------------+------------------+ +----------+--------+--------+----------------+-------------------+             PSV cm/s EDV cm/s Describe         Arm Pressure (mmHG)  +----------+--------+--------+----------------+-------------------+  Subclavian 118               Multiphasic, WNL                      +----------+--------+--------+----------------+-------------------+ +---------+--------+--+--------+--+---------+  Vertebral PSV cm/s 40 EDV cm/s 11 Antegrade   +---------+--------+--+--------+--+---------+   Summary: Right Carotid: Velocities in the right ICA are consistent with a 60-79%                stenosis. Left Carotid: Velocities in the left ICA are consistent with a 1-39% stenosis.               The ECA appears >50% stenosed. Vertebrals:  Bilateral vertebral arteries demonstrate antegrade flow. Subclavians: Normal flow hemodynamics were seen in bilateral subclavian              arteries. *See table(s) above for measurements and observations.  Electronically signed by Harold Barban MD on 11/22/2021 at 9:42:31 PM.    Final     Assessment/Plan 1. Bilateral subdural hematomas - followed by neuro - repeat CT head recommended in 2 weeks  2. 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 Urinary retention - foley placed 01/14 - pulled out 01/16- 29F coude placed in ED - urine tea colored - encourage fluids, monitor I/O - cont Keflex x 3 days for UTI prevention  4. Discharge of right eye - reconstructive surgery d/t melanoma 2019 - hx right brow lift 2022 - followed by Dr. Gershon Crane- suspect green discharge normal - discontinue Ofloxacin - start Maxitrol prn per Dr. Gershon Crane  5. Ectropion of right lower eyelid, unspecified - see above - cont artificial tears 4x daily - cont Genteal qhs   Family/ staff Communication: plan discussed with patient and nurse  Labs/tests ordered:   none

## 2021-11-29 NOTE — ED Notes (Signed)
[facility] contacted for transport back to Rehab at [facility]. Pt is 9th on the list for transport back to [facility]

## 2021-11-30 DIAGNOSIS — R2689 Other abnormalities of gait and mobility: Secondary | ICD-10-CM | POA: Diagnosis not present

## 2021-11-30 DIAGNOSIS — F02B0 Dementia in other diseases classified elsewhere, moderate, without behavioral disturbance, psychotic disturbance, mood disturbance, and anxiety: Secondary | ICD-10-CM | POA: Diagnosis not present

## 2021-11-30 DIAGNOSIS — M6389 Disorders of muscle in diseases classified elsewhere, multiple sites: Secondary | ICD-10-CM | POA: Diagnosis not present

## 2021-11-30 DIAGNOSIS — G308 Other Alzheimer's disease: Secondary | ICD-10-CM | POA: Diagnosis not present

## 2021-11-30 DIAGNOSIS — R293 Abnormal posture: Secondary | ICD-10-CM | POA: Diagnosis not present

## 2021-11-30 DIAGNOSIS — R41840 Attention and concentration deficit: Secondary | ICD-10-CM | POA: Diagnosis not present

## 2021-11-30 DIAGNOSIS — S065X9S Traumatic subdural hemorrhage with loss of consciousness of unspecified duration, sequela: Secondary | ICD-10-CM | POA: Diagnosis not present

## 2021-11-30 DIAGNOSIS — S065X1D Traumatic subdural hemorrhage with loss of consciousness of 30 minutes or less, subsequent encounter: Secondary | ICD-10-CM | POA: Diagnosis not present

## 2021-11-30 DIAGNOSIS — R296 Repeated falls: Secondary | ICD-10-CM | POA: Diagnosis not present

## 2021-11-30 DIAGNOSIS — R1312 Dysphagia, oropharyngeal phase: Secondary | ICD-10-CM | POA: Diagnosis not present

## 2021-11-30 DIAGNOSIS — R2681 Unsteadiness on feet: Secondary | ICD-10-CM | POA: Diagnosis not present

## 2021-11-30 DIAGNOSIS — R278 Other lack of coordination: Secondary | ICD-10-CM | POA: Diagnosis not present

## 2021-12-01 ENCOUNTER — Non-Acute Institutional Stay (SKILLED_NURSING_FACILITY): Payer: PPO | Admitting: Adult Health

## 2021-12-01 ENCOUNTER — Encounter: Payer: Self-pay | Admitting: Adult Health

## 2021-12-01 DIAGNOSIS — G308 Other Alzheimer's disease: Secondary | ICD-10-CM | POA: Diagnosis not present

## 2021-12-01 DIAGNOSIS — R41840 Attention and concentration deficit: Secondary | ICD-10-CM | POA: Diagnosis not present

## 2021-12-01 DIAGNOSIS — S065X9S Traumatic subdural hemorrhage with loss of consciousness of unspecified duration, sequela: Secondary | ICD-10-CM | POA: Diagnosis not present

## 2021-12-01 DIAGNOSIS — R278 Other lack of coordination: Secondary | ICD-10-CM | POA: Diagnosis not present

## 2021-12-01 DIAGNOSIS — R2689 Other abnormalities of gait and mobility: Secondary | ICD-10-CM | POA: Diagnosis not present

## 2021-12-01 DIAGNOSIS — F01B11 Vascular dementia, moderate, with agitation: Secondary | ICD-10-CM | POA: Diagnosis not present

## 2021-12-01 DIAGNOSIS — R5383 Other fatigue: Secondary | ICD-10-CM

## 2021-12-01 DIAGNOSIS — R296 Repeated falls: Secondary | ICD-10-CM

## 2021-12-01 DIAGNOSIS — G4733 Obstructive sleep apnea (adult) (pediatric): Secondary | ICD-10-CM | POA: Diagnosis not present

## 2021-12-01 DIAGNOSIS — S065XAA Traumatic subdural hemorrhage with loss of consciousness status unknown, initial encounter: Secondary | ICD-10-CM | POA: Diagnosis not present

## 2021-12-01 DIAGNOSIS — R2681 Unsteadiness on feet: Secondary | ICD-10-CM | POA: Diagnosis not present

## 2021-12-01 DIAGNOSIS — R310 Gross hematuria: Secondary | ICD-10-CM | POA: Diagnosis not present

## 2021-12-01 DIAGNOSIS — S065X1D Traumatic subdural hemorrhage with loss of consciousness of 30 minutes or less, subsequent encounter: Secondary | ICD-10-CM | POA: Diagnosis not present

## 2021-12-01 DIAGNOSIS — R293 Abnormal posture: Secondary | ICD-10-CM | POA: Diagnosis not present

## 2021-12-01 DIAGNOSIS — R339 Retention of urine, unspecified: Secondary | ICD-10-CM

## 2021-12-01 DIAGNOSIS — M6389 Disorders of muscle in diseases classified elsewhere, multiple sites: Secondary | ICD-10-CM | POA: Diagnosis not present

## 2021-12-01 DIAGNOSIS — R1312 Dysphagia, oropharyngeal phase: Secondary | ICD-10-CM | POA: Diagnosis not present

## 2021-12-01 DIAGNOSIS — F02B0 Dementia in other diseases classified elsewhere, moderate, without behavioral disturbance, psychotic disturbance, mood disturbance, and anxiety: Secondary | ICD-10-CM | POA: Diagnosis not present

## 2021-12-01 DIAGNOSIS — I480 Paroxysmal atrial fibrillation: Secondary | ICD-10-CM

## 2021-12-01 LAB — CBC AND DIFFERENTIAL
HCT: 43 (ref 41–53)
Hemoglobin: 14.7 (ref 13.5–17.5)
Neutrophils Absolute: 8
Platelets: 263 (ref 150–399)
WBC: 10.5

## 2021-12-01 LAB — CBC: RBC: 4.73 (ref 3.87–5.11)

## 2021-12-01 LAB — BASIC METABOLIC PANEL
BUN: 12 (ref 4–21)
CO2: 26 — AB (ref 13–22)
Chloride: 90 — AB (ref 99–108)
Creatinine: 0.9 (ref 0.6–1.3)
Glucose: 142
Potassium: 4.4 (ref 3.4–5.3)
Sodium: 122 — AB (ref 137–147)

## 2021-12-01 LAB — COMPREHENSIVE METABOLIC PANEL
Albumin: 3.6 (ref 3.5–5.0)
Calcium: 8.4 — AB (ref 8.7–10.7)

## 2021-12-01 LAB — HEPATIC FUNCTION PANEL
ALT: 16 (ref 10–40)
AST: 14 (ref 14–40)
Alkaline Phosphatase: 76 (ref 25–125)
Bilirubin, Total: 0.7

## 2021-12-01 NOTE — Progress Notes (Addendum)
Location:  Occupational psychologist of Service:  SNF (31) Provider:   Cindi Carbon, [place] [phone]   Crist Infante, MD  Patient Care Team: Crist Infante, MD as PCP - General (Internal Medicine)  Extended Emergency Contact Information Primary Emergency Contact: [place] Mobile Phone: [phone] Relation: Spouse Secondary Emergency Contact: Joni Reining Mobile Phone: [phone] Relation: Daughter  Code Status:  DNR Goals of care: Advanced Directive information Advanced Directives 11/29/2021  Does Patient Have a Medical Advance Directive? Yes  Type of Paramedic of [place];Living will;Out of facility DNR (pink MOST or yellow form)  Does patient want to make changes to medical advance directive? Yes (ED - send information to MyChart)  Copy of [place] in Chart? Yes - validated most recent copy scanned in chart (See row information)  Would patient like information on creating a medical advance directive? -  Pre-existing out of facility DNR order (yellow form or pink MOST form) Yellow form placed in chart (order not valid for inpatient use)     Chief Complaint  Patient presents with   Acute Visit    lethargy    HPI:  Pt is a 81 y.o. male seen today for an acute visit for lethargy.   PMH: CAD, HTN, atrial fib, PSVT, dysphagia, dementia, melanoma, CKD, HLD and falls,   01/14 he was found unconscious by his wife. Hospitalized 01/09-01/13, CT head revealed bilateral subdural hematomas. Mental status improved during hospitalization. 01/16 seen by neuro, MOCA score now 6, was 17/30 02/2021. Recent decline due to mixed dementia vascular/Alzheimer's dementia. F/u CT head recommended in 2 weeks, advised to follow up in 3-6 months.   01/14 he was diagnosed with urinary retention. Foley placed in ED. 01/16 he pulled his foley out in the early morning. Nursing staff was unable to reinsert  foley. He was sent to ED, 86F coude placed. He was given Keflex x 3 days for UTI prevention. Scheduled to see urology 12/02/2021  1/19: His wife and the nursing staff report he is sleepy and has been eating less. He was able to walk with therapy but leaned backwards. He is able to f/c. He is sleeping for most of the visit but arouses. His wife states he is eating less. Output from foley bag is adequate but blood tinged. No clots were noted.  Past Medical History:  Diagnosis Date   Atrial fibrillation ([place])    Colon polyps    Dementia ([place])    Diverticulosis    Hypercholesterolemia    Hypertension    Internal hemorrhoids    Urinary retention    Past Surgical History:  Procedure Laterality Date   EYE SURGERY     X 4   HERNIA REPAIR  2006   abdominal   SKIN CANCER EXCISION     shoulder, basal cell   SQUAMOUS CELL CARCINOMA EXCISION     preauricular on the right   TONSILLECTOMY  1947   VASECTOMY  1981    Allergies  Allergen Reactions   Donepezil Other (See Comments)    Per wife "has not done well."   Other Other (See Comments)    Vicryl sutures, pte. Doesn't know what reaction he had, "Drs. Owens Shark + Yeatts deetrmined not to use it on me"    Outpatient Encounter Medications as of 12/01/2021  Medication Sig   acetaminophen (TYLENOL) 325 MG tablet Take 650 mg by mouth every 4 (four) hours as needed.   citalopram (CELEXA)  10 MG tablet Take 1 tablet (10 mg total) by mouth daily.   ketoconazole (NIZORAL) 2 % shampoo Apply 1 application topically 3 (three) times a week.   metoprolol succinate (TOPROL-XL) 50 MG 24 hr tablet Take 1 tablet (50 mg total) by mouth daily. Take with or immediately following a meal.   niacinamide 500 MG tablet Take 500 mg by mouth 2 (two) times daily with a meal.   ofloxacin (OCUFLOX) 0.3 % ophthalmic solution Place 1 drop into the right eye 2 (two) times daily.   ramipril (ALTACE) 2.5 MG capsule Take 2.5 mg by mouth daily.   rivastigmine (EXELON) 4.6  mg/24hr Place 1 patch (4.6 mg total) onto the skin daily.   tamsulosin (FLOMAX) 0.4 MG CAPS capsule Take 1 capsule (0.4 mg total) by mouth daily.   triamcinolone cream (KENALOG) 0.1 % Apply 1 application topically daily.   valACYclovir (VALTREX) 500 MG tablet Take 500 mg by mouth 2 (two) times daily as needed. Flare up   [DISCONTINUED] cephALEXin (KEFLEX) 500 MG capsule Take 500 mg by mouth 2 (two) times daily. For 3 days   No facility-administered encounter medications on file as of 12/01/2021.    Review of Systems  Constitutional:  Positive for activity change, appetite change and fatigue. Negative for chills, diaphoresis, fever and unexpected weight change.  HENT:  Negative for congestion.   Respiratory:  Negative for cough, shortness of breath, wheezing and stridor.   Cardiovascular:  Negative for chest pain, palpitations and leg swelling.  Gastrointestinal:  Negative for abdominal distention, abdominal pain, constipation and diarrhea.  Genitourinary:  Positive for hematuria. Negative for difficulty urinating, dysuria, flank pain and frequency.  Musculoskeletal:  Positive for gait problem. Negative for arthralgias, back pain, joint swelling and myalgias.  Neurological:  Negative for dizziness, seizures, syncope, facial asymmetry, speech difficulty, weakness and headaches.  Hematological:  Negative for adenopathy. Does not bruise/bleed easily.  Psychiatric/Behavioral:  Positive for agitation and confusion.    Immunization History  Administered Date(s) Administered   Influenza Split 05/02/2010, 07/31/2011, 09/20/2012, 11/13/2012, 10/26/2014   Influenza, High Dose Seasonal PF 10/26/2014, 10/06/2015   Influenza, Quadrivalent, Recombinant, Inj, Pf 08/21/2018, 08/16/2020   Influenza-Unspecified 10/06/2015, 08/16/2017   PFIZER(Purple Top)SARS-COV-2 Vaccination 12/25/2019   Pneumococcal Conjugate-13 10/24/2013   Pneumococcal Polysaccharide-23 01/29/2003, 10/26/2009   Td,absorbed,  Preservative Free, Adult Use, Lf Unspecified 05/23/2004, 11/20/2012   Tdap 10/26/2014, 06/03/2019   Unspecified SARS-COV-2 Vaccination 11/25/2019   Zoster Recombinat (Shingrix) 12/27/2017, 02/26/2018   Zoster, Live 12/04/2007, 02/26/2018   Pertinent  Health Maintenance Due  Topic Date Due   INFLUENZA VACCINE  06/13/2021   Fall Risk 11/24/2021 11/25/2021 11/25/2021 11/26/2021 11/29/2021  Falls in the past year? - - - - -  Was there an injury with Fall? - - - - -  Fall Risk Category Calculator - - - - -  Fall Risk Category - - - - -  Patient Fall Risk Level High fall risk High fall risk High fall risk High fall risk High fall risk   Functional Status Survey:    Vitals:   12/01/21 1736  BP: (!) 157/84  Pulse: 74  Resp: 20  Temp: 98.4 F (36.9 C)  SpO2: 95%   There is no height or weight on file to calculate BMI. Physical Exam Vitals reviewed.  Constitutional:      General: He is not in acute distress.    Appearance: He is not diaphoretic.  HENT:     Head: Normocephalic and atraumatic.  Neck:  Thyroid: No thyromegaly.     Vascular: No JVD.     Trachea: No tracheal deviation.  Cardiovascular:     Rate and Rhythm: Normal rate and regular rhythm.     Heart sounds: No murmur heard. Pulmonary:     Effort: Pulmonary effort is normal. No respiratory distress.     Breath sounds: Normal breath sounds. No wheezing.  Abdominal:     General: Bowel sounds are normal. There is no distension.     Palpations: Abdomen is soft.     Tenderness: There is no abdominal tenderness. There is no right CVA tenderness or left CVA tenderness.  Genitourinary:    Comments: Bloody tinged urine in bag Musculoskeletal:     Right lower leg: No edema.     Left lower leg: No edema.  Lymphadenopathy:     Cervical: No cervical adenopathy.  Skin:    General: Skin is warm and dry.  Neurological:     General: No focal deficit present.     Mental Status: He is alert.     Cranial Nerves: No cranial  nerve deficit.     Comments: Difficulty following commands for full exam.  No obvious focal deficit. Able to open eyes then falls back asleep. Equal grip strength. Scar to right eye and right side of face from prior surgery due to melanoma.     Labs reviewed: Recent Labs    11/23/21 0349 11/23/21 2328 11/24/21 0203  NA 138 132* 133*  K 4.2 3.6 3.6  CL 106 97* 99  CO2 23 24 24   GLUCOSE 101* 152* 142*  BUN 15 15 15   CREATININE 1.00 1.04 0.94  CALCIUM 8.5* 8.5* 8.6*  MG  --  1.8  --    Recent Labs    03/05/21 1008  AST 16  ALT 16  ALKPHOS 50  BILITOT 1.1  PROT 6.3*  ALBUMIN 4.1   Recent Labs    03/05/21 1008 11/21/21 1050 11/22/21 1402 11/23/21 0349 11/24/21 0203  WBC 8.5 11.1* 12.1* 10.0 12.5*  NEUTROABS 7.5 9.1*  --   --  10.2*  HGB 15.8 17.7* 15.9 15.9 14.4  HCT 47.5 54.0* 48.7 48.4 43.4  MCV 90.8 93.3 90.9 92.0 90.4  PLT 152 198 199 181 168   Lab Results  Component Value Date   TSH 0.476 11/21/2021   Lab Results  Component Value Date   HGBA1C 5.2 11/10/2008   Lab Results  Component Value Date   CHOL 125 02/04/2008   HDL 42.4 02/04/2008   LDLCALC 77 02/04/2008   TRIG 28 02/04/2008   CHOLHDL 2.9 CALC 02/04/2008    Significant Diagnostic Results in last 30 days:  DG Wrist 2 Views Right  Result Date: 11/21/2021 CLINICAL DATA:  Fall, dementia EXAM: RIGHT WRIST - 2 VIEW COMPARISON:  None. FINDINGS: There is no evidence of fracture or dislocation. There is no evidence of arthropathy or other focal bone abnormality. Soft tissues are unremarkable. IMPRESSION: Negative. Electronically Signed   By: Keane Police D.O.   On: 11/21/2021 16:19   DG Wrist Complete Right  Result Date: 11/21/2021 CLINICAL DATA:  Trauma, pain EXAM: RIGHT WRIST - COMPLETE 3+ VIEW COMPARISON:  None. FINDINGS: No recent fracture or dislocation is seen. Degenerative changes are noted with bony spurs in first carpometacarpal joint and the interphalangeal joint of thumb. Minimal bony spurs  seen in the lateral radiocarpal joint. IMPRESSION: No recent fracture or dislocation is seen in the right wrist. Electronically Signed   By: Royston Cowper  Rathinasamy M.D.   On: 11/21/2021 11:29   CT HEAD WO CONTRAST (5MM)  Result Date: 11/23/2021 CLINICAL DATA:  Stroke follow-up. EXAM: CT HEAD WITHOUT CONTRAST TECHNIQUE: Contiguous axial images were obtained from the base of the skull through the vertex without intravenous contrast. RADIATION DOSE REDUCTION: This exam was performed according to the departmental dose-optimization program which includes automated exposure control, adjustment of the mA and/or kV according to patient size and/or use of iterative reconstruction technique. COMPARISON:  CT head 11/22/2021 FINDINGS: Brain: A parenchymal hematoma inferiorly in the posterior left temporal lobe has mildly increased in size, measuring 3.0 x 5.9 x 2.4 cm (estimated volume of 21 mL). Mild surrounding edema is unchanged, as is mass effect on the temporal horn of the left lateral ventricle. There is trace hemorrhage in the occipital horns of the lateral ventricles which has decreased. Minimal hemorrhage along the posterior aspect of the third ventricle/medial margin of the right thalamus is unchanged. Minimal subarachnoid hemorrhage near the posterior aspect of the left sylvian fissure is unchanged. Small subdural hematomas over the cerebral convexities are unchanged and measure up to 8 mm in thickness on the right and 3 mm on the left. A small amount of subdural blood along the tentorium bilaterally is also unchanged. There is no significant midline shift. There is no evidence of an acute infarct, new hemorrhage, or hydrocephalus. Patchy hypodensities in the cerebral white matter bilaterally are unchanged and nonspecific but compatible with moderate chronic small vessel ischemic disease. There is mild cerebral atrophy. Vascular: Calcified atherosclerosis at the skull base. Skull: No acute fracture or suspicious  osseous lesion. Sinuses/Orbits: Unchanged large right mastoid effusion. Clear paranasal sinuses. Bilateral cataract extraction. Right eyelid weight. Other: Posterior scalp skin staples with mild swelling/contusion. Postoperative changes in the right parotid region. IMPRESSION: 1. Mild enlargement of the left temporal lobe hematoma. Unchanged mild edema. 2. Unchanged small subdural hematomas and minimal subarachnoid hemorrhage. 3. Decreased intraventricular hemorrhage. Electronically Signed   By: Logan Bores M.D.   On: 11/23/2021 13:31   CT HEAD WO CONTRAST (5MM)  Result Date: 11/22/2021 CLINICAL DATA:  Intracranial bleeding, trauma EXAM: CT HEAD WITHOUT CONTRAST TECHNIQUE: Contiguous axial images were obtained from the base of the skull through the vertex without intravenous contrast. COMPARISON:  11/21/2021 FINDINGS: Brain: There is interval appearance of large lobulated intra cerebral hematoma measuring a proximally 4.7 x 2.9 x 2.8 cm in the left posterior temporal lobe. Estimated volume is approximately 19 mL. There is surrounding edema. There is 6 mm area of bleeding in the right posterior third ventricle. There is interval layering of small amount of blood in the dependent portions of occipital lobes in both lateral ventricles. Small 3 mm subdural hematoma seen in the periphery of left cerebral hemisphere is less evident. There is increase in amount of CSF density adjacent to both frontal lobes, more so on the right side measuring up to 10 mm in the diameter. There is no shift of midline structures. There is increased density between the leaves tentorium with interval worsening more so on the left side. There is subtle increased density in the posterior right sylvian fissure, possibly presence of subarachnoid hemorrhage. Similar finding to a lesser extent is seen in the posterior left sylvian fissure. Vascular: Scattered arterial calcifications are seen. Skull: No displaced fracture is seen.  Sinuses/Orbits: Sinuses are unremarkable. There is a metallic density in the anterior right orbit. IMPRESSION: There is interval appearance of 4.7 x 2.9 x 2.8 cm lobulated intra cerebral hematoma  in the posterior left temporal lobe. There is interval increase in the amount of blood in the third and both lateral ventricles. There is increased amount of blood between the leaves of the tentorium, more so on the left side. There is subtle increased density in the posterior aspects of sylvian fissure on both sides, more so on the right side possibly suggesting subarachnoid hemorrhage. 3 mm acute left subdural hematoma seen in the previous examination appears less prominent. There is increased amount of CSF density in the periphery of both frontal lobes, more so on the right side measuring up to 10 mm in thickness without definite evidence of recent bleeding. Imaging findings were relayed to Dr. Tyrell Antonio by telephone call. She will contact neurosurgeon. Electronically Signed   By: Elmer Picker M.D.   On: 11/22/2021 11:41   CT Head Wo Contrast  Addendum Date: 11/21/2021   ADDENDUM REPORT: 11/21/2021 11:59 ADDENDUM: Posterior scalp laceration. Electronically Signed   By: Logan Bores M.D.   On: 11/21/2021 11:59   Result Date: 11/21/2021 CLINICAL DATA:  Head trauma.  Fall today.  Head and neck pain. EXAM: CT HEAD WITHOUT CONTRAST CT CERVICAL SPINE WITHOUT CONTRAST TECHNIQUE: Multidetector CT imaging of the head and cervical spine was performed following the standard protocol without intravenous contrast. Multiplanar CT image reconstructions of the cervical spine were also generated. COMPARISON:  CT head and cervical spine 03/05/2021 FINDINGS: CT HEAD FINDINGS Brain: A new small hyperdense subdural hematoma over the left lateral cerebral convexity measures up to 3 mm in thickness without mass effect. Trace subdural hemorrhage is also present along the posterior aspect of the falx and along the left greater than  right tentorial leaflets, and there is also new trace hypoattenuating subdural fluid or blood over the right cerebral convexity. There is minimal acute hemorrhage in the fourth ventricle. There is also a small extra-axial focus of curvilinear hyperdensity along the posterolateral aspect of third ventricle coursing towards the pineal region (series 4, image 16). No acute cortically based infarct or midline shift is evident. There is mild cerebral atrophy. Patchy hypodensities in the cerebral white matter bilaterally are unchanged and nonspecific but compatible with moderate chronic small vessel ischemic disease. Vascular: Calcified atherosclerosis at the skull base. Skull: No acute fracture or suspicious osseous lesion. Sinuses/Orbits: Clear paranasal sinuses. Unchanged large right mastoid effusion. Right eyelid weight. Bilateral cataract extraction. Other: Postoperative changes involving the right parotid gland. CT CERVICAL SPINE FINDINGS Alignment: Chronic trace anterolisthesis of C7 on T1. Skull base and vertebrae: No acute fracture or suspicious osseous lesion. Soft tissues and spinal canal: No prevertebral fluid or swelling. No visible canal hematoma. Disc levels: Multilevel disc degeneration, moderate at C6-7. Asymmetrically advanced right-sided cervical facet arthrosis at multiple levels. Right facet ankylosis at C2-3. Moderate multilevel neural foraminal stenosis and at least mild multilevel spinal stenosis. Upper chest: Clear lung apices. Other: Postoperative changes in the right upper neck. IMPRESSION: 1. Small acute bilateral subdural hematomas without mass effect. 2. Small volume acute hemorrhage in the fourth ventricle. 3. Small hyperdensity along the posterolateral aspect of the third ventricle on the right, likely additional extra-axial hemorrhage although deep cerebral vein thrombosis is an alternative consideration. 4. Moderate chronic small vessel ischemic disease. 5. No acute cervical spine  fracture. Critical Value/emergent results were called by telephone at the time of interpretation on 11/21/2021 at 11:47 am to provider Myna Bright, who verbally acknowledged these results. Electronically Signed: By: Logan Bores M.D. On: 11/21/2021 11:54   CT Cervical Spine  Wo Contrast  Addendum Date: 11/21/2021   ADDENDUM REPORT: 11/21/2021 11:59 ADDENDUM: Posterior scalp laceration. Electronically Signed   By: Logan Bores M.D.   On: 11/21/2021 11:59   Result Date: 11/21/2021 CLINICAL DATA:  Head trauma.  Fall today.  Head and neck pain. EXAM: CT HEAD WITHOUT CONTRAST CT CERVICAL SPINE WITHOUT CONTRAST TECHNIQUE: Multidetector CT imaging of the head and cervical spine was performed following the standard protocol without intravenous contrast. Multiplanar CT image reconstructions of the cervical spine were also generated. COMPARISON:  CT head and cervical spine 03/05/2021 FINDINGS: CT HEAD FINDINGS Brain: A new small hyperdense subdural hematoma over the left lateral cerebral convexity measures up to 3 mm in thickness without mass effect. Trace subdural hemorrhage is also present along the posterior aspect of the falx and along the left greater than right tentorial leaflets, and there is also new trace hypoattenuating subdural fluid or blood over the right cerebral convexity. There is minimal acute hemorrhage in the fourth ventricle. There is also a small extra-axial focus of curvilinear hyperdensity along the posterolateral aspect of third ventricle coursing towards the pineal region (series 4, image 16). No acute cortically based infarct or midline shift is evident. There is mild cerebral atrophy. Patchy hypodensities in the cerebral white matter bilaterally are unchanged and nonspecific but compatible with moderate chronic small vessel ischemic disease. Vascular: Calcified atherosclerosis at the skull base. Skull: No acute fracture or suspicious osseous lesion. Sinuses/Orbits: Clear paranasal sinuses.  Unchanged large right mastoid effusion. Right eyelid weight. Bilateral cataract extraction. Other: Postoperative changes involving the right parotid gland. CT CERVICAL SPINE FINDINGS Alignment: Chronic trace anterolisthesis of C7 on T1. Skull base and vertebrae: No acute fracture or suspicious osseous lesion. Soft tissues and spinal canal: No prevertebral fluid or swelling. No visible canal hematoma. Disc levels: Multilevel disc degeneration, moderate at C6-7. Asymmetrically advanced right-sided cervical facet arthrosis at multiple levels. Right facet ankylosis at C2-3. Moderate multilevel neural foraminal stenosis and at least mild multilevel spinal stenosis. Upper chest: Clear lung apices. Other: Postoperative changes in the right upper neck. IMPRESSION: 1. Small acute bilateral subdural hematomas without mass effect. 2. Small volume acute hemorrhage in the fourth ventricle. 3. Small hyperdensity along the posterolateral aspect of the third ventricle on the right, likely additional extra-axial hemorrhage although deep cerebral vein thrombosis is an alternative consideration. 4. Moderate chronic small vessel ischemic disease. 5. No acute cervical spine fracture. Critical Value/emergent results were called by telephone at the time of interpretation on 11/21/2021 at 11:47 am to provider Myna Bright, who verbally acknowledged these results. Electronically Signed: By: Logan Bores M.D. On: 11/21/2021 11:54   ECHOCARDIOGRAM COMPLETE  Result Date: 11/22/2021    ECHOCARDIOGRAM REPORT   Patient Name:   Jeffrey [name] [facility] Date of Exam: 11/22/2021 Medical Rec #:  [id number]        Height:       72.0 in Accession #:    [id number]       Weight:       170.5 lb Date of Birth:  02-17-1941        BSA:          1.991 m Patient Age:    17 years         BP:           107/67 mmHg Patient Gender: M                HR:           82  bpm. Exam Location:  Inpatient Procedure: 2D Echo, Cardiac Doppler and Color Doppler Indications:     syncope  History:        Patient has prior history of Echocardiogram examinations, most                 recent 08/29/2019. Risk Factors:Hypertension. Hyperlipidemia.  Sonographer:    Beryle Beams Referring Phys: [id number] [place]  1. Left ventricular ejection fraction, by estimation, is 55 to 60%. The left ventricle has normal function. Left ventricular endocardial border not optimally defined to evaluate regional wall motion. There may be hypokinesis of the apical segments of the inferior wall and inferior septum. Left ventricular diastolic parameters are consistent with Grade I diastolic dysfunction (impaired relaxation).  2. Right ventricular systolic function is normal. The right ventricular size is normal.  3. The mitral valve is normal in structure. No evidence of mitral valve regurgitation. No evidence of mitral stenosis.  4. The aortic valve is normal in structure. Aortic valve regurgitation is not visualized. No aortic stenosis is present.  5. The inferior vena cava is normal in size with greater than 50% respiratory variability, suggesting right atrial pressure of 3 mmHg. FINDINGS  Left Ventricle: Left ventricular ejection fraction, by estimation, is 55 to 60%. The left ventricle has normal function. Left ventricular endocardial border not optimally defined to evaluate regional wall motion. The left ventricular internal cavity size was normal in size. There is no left ventricular hypertrophy. Left ventricular diastolic parameters are consistent with Grade I diastolic dysfunction (impaired relaxation). Indeterminate filling pressures. Right Ventricle: Ivc not seen. The right ventricular size is normal. No increase in right ventricular wall thickness. Right ventricular systolic function is normal. Left Atrium: Left atrial size was normal in size. Right Atrium: Right atrial size was normal in size. Pericardium: Trivial pericardial effusion is present. Mitral Valve: The mitral valve is normal  in structure. No evidence of mitral valve regurgitation. No evidence of mitral valve stenosis. Tricuspid Valve: The tricuspid valve is normal in structure. Tricuspid valve regurgitation is not demonstrated. No evidence of tricuspid stenosis. Aortic Valve: The aortic valve is normal in structure. Aortic valve regurgitation is not visualized. No aortic stenosis is present. Aortic valve mean gradient measures 3.0 mmHg. Aortic valve peak gradient measures 5.1 mmHg. Aortic valve area, by VTI measures 2.33 cm. Pulmonic Valve: The pulmonic valve was normal in structure. Pulmonic valve regurgitation is not visualized. No evidence of pulmonic stenosis. Aorta: The aortic root is normal in size and structure. Venous: The inferior vena cava is normal in size with greater than 50% respiratory variability, suggesting right atrial pressure of 3 mmHg. IAS/Shunts: No atrial level shunt detected by color flow Doppler.  LEFT VENTRICLE PLAX 2D LVIDd:         4.80 cm     Diastology LVIDs:         2.80 cm     LV e' medial:    4.46 cm/s LV PW:         1.10 cm     LV E/e' medial:  12.1 LV IVS:        0.70 cm     LV e' lateral:   4.46 cm/s LVOT diam:     1.80 cm     LV E/e' lateral: 12.1 LV SV:         48 LV SV Index:   24 LVOT Area:     2.54 cm  LV Volumes (MOD) LV vol d, MOD A2C:  35.3 ml LV vol d, MOD A4C: 42.3 ml LV vol s, MOD A2C: 13.3 ml LV vol s, MOD A4C: 19.1 ml LV SV MOD A2C:     22.0 ml LV SV MOD A4C:     42.3 ml LV SV MOD BP:      24.4 ml RIGHT VENTRICLE RV S prime:     9.36 cm/s TAPSE (M-mode): 2.0 cm LEFT ATRIUM           Index        RIGHT ATRIUM          Index LA diam:      2.70 cm 1.36 cm/m   RA Area:     6.72 cm LA Vol (A2C): 33.8 ml 16.98 ml/m  RA Volume:   9.86 ml  4.95 ml/m LA Vol (A4C): 21.7 ml 10.90 ml/m  AORTIC VALVE                    PULMONIC VALVE AV Area (Vmax):    2.32 cm     PV Vmax:       0.88 m/s AV Area (Vmean):   2.60 cm     PV Peak grad:  3.1 mmHg AV Area (VTI):     2.33 cm AV Vmax:            113.00 cm/s AV Vmean:          75.100 cm/s AV VTI:            0.205 m AV Peak Grad:      5.1 mmHg AV Mean Grad:      3.0 mmHg LVOT Vmax:         103.00 cm/s LVOT Vmean:        76.700 cm/s LVOT VTI:          0.188 m LVOT/AV VTI ratio: 0.92  AORTA Ao Root diam: 2.60 cm Ao Asc diam:  2.50 cm MITRAL VALVE                TRICUSPID VALVE MV Area (PHT): 5.54 cm     TV Peak grad:   13.4 mmHg MV Decel Time: 137 msec     TV Mean grad:   11.0 mmHg MV E velocity: 53.90 cm/s   TV Vmax:        1.83 m/s MV A velocity: 103.00 cm/s  TV Vmean:       159.0 cm/s MV E/A ratio:  0.52         TV VTI:         0.58 msec                              SHUNTS                             Systemic VTI:  0.19 m                             Systemic Diam: 1.80 cm Dani Gobble Croitoru MD Electronically signed by Sanda Klein MD Signature Date/Time: 11/22/2021/10:29:33 AM    Final    VAS US CAROTID  Result Date: 11/22/2021 Carotid Arterial Duplex Study Patient Name:  Jeffrey [name] [facility]  Date of Exam:   11/22/2021 Medical Rec #: [id number]         Accession #:  [id number] Date of Birth: Aug 11, 1941         Patient Gender: M Patient Age:   63 years Exam Location:  [facility] Procedure:      VAS US CAROTID Referring Phys: Orma Flaming --------------------------------------------------------------------------------  Indications:       History of carotid stenosis. Risk Factors:      Hypertension, hyperlipidemia, past history of smoking. Other Factors:     Dementia, multiple falls. Comparison Study:  09-08-2020 Most recent prior carotid duplex consistent with                    50-69% ICA stenosis bilaterally. Performing Technologist: Darlin Coco RDMS, RVT  Examination Guidelines: A complete evaluation includes B-mode imaging, spectral Doppler, color Doppler, and power Doppler as needed of all accessible portions of each vessel. Bilateral testing is considered an integral part of a complete examination. Limited examinations for reoccurring  indications may be performed as noted.  Right Carotid Findings: +----------+--------+--------+--------+------------------+---------------------+             PSV cm/s EDV cm/s Stenosis Plaque Description Comments               +----------+--------+--------+--------+------------------+---------------------+  CCA Prox   64       21                                   intimal thickening     +----------+--------+--------+--------+------------------+---------------------+  CCA Distal 50       17                hyperechoic,                                                                     heterogenous and                                                                 irregular                                 +----------+--------+--------+--------+------------------+---------------------+  ICA Prox   289      66       60-79%                                             +----------+--------+--------+--------+------------------+---------------------+  ICA Mid    287      49                                   post-stenotic  turbulence             +----------+--------+--------+--------+------------------+---------------------+  ICA Distal 54       16                                   tortuous               +----------+--------+--------+--------+------------------+---------------------+  ECA        72       22                                                          +----------+--------+--------+--------+------------------+---------------------+ +----------+--------+-------+----------------+-------------------+             PSV cm/s EDV cms Describe         Arm Pressure (mmHG)  +----------+--------+-------+----------------+-------------------+  Subclavian 155              Multiphasic, WNL                      +----------+--------+-------+----------------+-------------------+ +---------+--------+--+--------+--+---------+  Vertebral PSV cm/s 46 EDV cm/s 11 Antegrade   +---------+--------+--+--------+--+---------+  Left Carotid Findings: +----------+--------+--------+--------+-------------------+------------------+             PSV cm/s EDV cm/s Stenosis Plaque Description  Comments            +----------+--------+--------+--------+-------------------+------------------+  CCA Prox   67       15                                                        +----------+--------+--------+--------+-------------------+------------------+  CCA Distal 72       23                                    intimal thickening  +----------+--------+--------+--------+-------------------+------------------+  ICA Prox   67       21       1-39%    heterogenous , mild                     +----------+--------+--------+--------+-------------------+------------------+  ICA Mid    54       19                                                        +----------+--------+--------+--------+-------------------+------------------+  ICA Distal 58       22                                                        +----------+--------+--------+--------+-------------------+------------------+  ECA        154      27       >50%                                             +----------+--------+--------+--------+-------------------+------------------+ +----------+--------+--------+----------------+-------------------+  PSV cm/s EDV cm/s Describe         Arm Pressure (mmHG)  +----------+--------+--------+----------------+-------------------+  Subclavian 118               Multiphasic, WNL                      +----------+--------+--------+----------------+-------------------+ +---------+--------+--+--------+--+---------+  Vertebral PSV cm/s 40 EDV cm/s 11 Antegrade  +---------+--------+--+--------+--+---------+   Summary: Right Carotid: Velocities in the right ICA are consistent with a 60-79%                stenosis. Left Carotid: Velocities in the left ICA are consistent with a 1-39% stenosis.               The ECA appears  >50% stenosed. Vertebrals:  Bilateral vertebral arteries demonstrate antegrade flow. Subclavians: Normal flow hemodynamics were seen in bilateral subclavian              arteries. *See table(s) above for measurements and observations.  Electronically signed by Harold Barban MD on 11/22/2021 at 9:42:31 PM.    Final     Assessment/Plan 1. Lethargy Rapid covid negative ?if this is due to infection vs dehydration. Was able to walk with therapy. CBC and CMP ordered stat. No acute focal change. See below.  He has a cpap machine at the bedside he is using.  2. Subdural hematoma Due for f/u head CT 1/27.   Discussed with his wife that if his labs are not revealing we could consider repeating the CT sooner but she did indicate that they would not want any aggressive care. He has a DNR and most form.   3. Moderate vascular dementia with agitation Prior to the fall he was ambulatory and lived in [place] with his wife, quite functional per her account. Recent MOCA 6/30 after fall with subdural. Currently on exelon. Followed by neurology   4. Urinary retention Has BPH on flomax Seeing urology in am Continue to monitor urine out put and scan bladder if not draining.   5. Gross hematuria Improving after traumatic insertion/removal Prior UA on 1/9 neg Just completed Keflex Will await urology input in am, possible UA C and S. Just completed keflex which may skew results.   6. Frequent falls Due to dementia and poor balance Working with PT  7. Paroxysmal atrial fibrillation (HCC) Regular on exam Not on anticoagulation due subdural hematoma and falls ON toprol. If no other cause is identified could consider dose reduction of metoprolol to help with lethargy    Family/ staff Communication: discussed with his wife at the bedside  Labs/tests ordered:  CBC with diff, CMP rapid covid

## 2021-12-02 ENCOUNTER — Non-Acute Institutional Stay (SKILLED_NURSING_FACILITY): Admitting: Adult Health

## 2021-12-02 ENCOUNTER — Encounter: Payer: Self-pay | Admitting: Adult Health

## 2021-12-02 DIAGNOSIS — E871 Hypo-osmolality and hyponatremia: Secondary | ICD-10-CM

## 2021-12-02 DIAGNOSIS — I10 Essential (primary) hypertension: Secondary | ICD-10-CM | POA: Diagnosis not present

## 2021-12-02 DIAGNOSIS — F01B11 Vascular dementia, moderate, with agitation: Secondary | ICD-10-CM | POA: Diagnosis not present

## 2021-12-02 DIAGNOSIS — I480 Paroxysmal atrial fibrillation: Secondary | ICD-10-CM

## 2021-12-02 DIAGNOSIS — S065XAA Traumatic subdural hemorrhage with loss of consciousness status unknown, initial encounter: Secondary | ICD-10-CM

## 2021-12-02 DIAGNOSIS — R310 Gross hematuria: Secondary | ICD-10-CM

## 2021-12-02 DIAGNOSIS — R296 Repeated falls: Secondary | ICD-10-CM | POA: Diagnosis not present

## 2021-12-02 DIAGNOSIS — R339 Retention of urine, unspecified: Secondary | ICD-10-CM

## 2021-12-02 DIAGNOSIS — R338 Other retention of urine: Secondary | ICD-10-CM | POA: Diagnosis not present

## 2021-12-02 MED ORDER — MORPHINE SULFATE (CONCENTRATE) 20 MG/ML PO SOLN: 5.0000 mg | ORAL | 0 refills | Status: AC | PRN

## 2021-12-02 MED ORDER — LORAZEPAM 0.5 MG PO TABS: 0.5000 mg | ORAL_TABLET | ORAL | 0 refills | Status: AC | PRN

## 2021-12-02 NOTE — Progress Notes (Signed)
Location:  Occupational psychologist of Service:  SNF (31) Provider:   Cindi Carbon, [place] [phone]   Crist Infante, MD  Patient Care Team: Crist Infante, MD as PCP - General (Internal Medicine)  Extended Emergency Contact Information Primary Emergency Contact: [place] Mobile Phone: [phone] Relation: Spouse Secondary Emergency Contact: Joni Reining Mobile Phone: [phone] Relation: Daughter  Code Status:  DNR Goals of care: Advanced Directive information Advanced Directives 11/29/2021  Does Patient Have a Medical Advance Directive? Yes  Type of Paramedic of [place];Living will;Out of facility DNR (pink MOST or yellow form)  Does patient want to make changes to medical advance directive? Yes (ED - send information to MyChart)  Copy of [place] in Chart? Yes - validated most recent copy scanned in chart (See row information)  Would patient like information on creating a medical advance directive? -  Pre-existing out of facility DNR order (yellow form or pink MOST form) Yellow form placed in chart (order not valid for inpatient use)     Chief Complaint  Patient presents with   Acute Visit    hyponatremia    HPI:  Pt is a 81 y.o. male seen today for an acute visit for hyponatremia.   PMH: CAD, HTN, atrial fib, PSVT, dysphagia, dementia, melanoma, CKD, HLD and falls,   01/14 he was found unconscious by his wife. Hospitalized 01/09-01/13, CT head revealed bilateral subdural hematomas. Mental status improved during hospitalization. 01/16 seen by neuro, MOCA score now 6, was 17/30 02/2021. Recent decline due to mixed dementia vascular/Alzheimer's dementia. F/u CT head recommended in 2 weeks, advised to follow up in 3-6 months.   01/14 he was diagnosed with urinary retention. Foley placed in ED. 01/16 he pulled his foley out in the early morning. Nursing staff was unable to  reinsert foley. He was sent to ED, 28F coude placed. He was given Keflex x 3 days for UTI prevention. Scheduled to see urology 12/02/2021  1/19: His wife and the nursing staff report he is sleepy and has been eating less. He was able to walk with therapy but leaned backwards. He is able to f/c. He is sleeping for most of the visit but arouses. His wife states he is eating less. Output from foley bag is adequate but blood tinged. No clots were noted.  1/20 Labs returned last evening with NA of 122. Other labs unremarkable. Sodium tabs ordered and celexa discontinued. Also his bp was 180/100.  He went to the urologist this morning and they were very concerned about his labs and wanted to send him to the ER. His daughter Jeffrey [name] is a PA and asked that I call her. We discussed his goals of care and treatment options by phone and later in person. His repeat NA was 129 but he remains lethargic this am and did not eat breakfast.   Past Medical History:  Diagnosis Date   Atrial fibrillation ([place])    Colon polyps    Dementia ([place])    Diverticulosis    Hypercholesterolemia    Hypertension    Internal hemorrhoids    Urinary retention    Past Surgical History:  Procedure Laterality Date   EYE SURGERY     X 4   HERNIA REPAIR  2006   abdominal   SKIN CANCER EXCISION     shoulder, basal cell   SQUAMOUS CELL CARCINOMA EXCISION     preauricular on the right   TONSILLECTOMY  [place]    Allergies  Allergen Reactions   Donepezil Other (See Comments)    Per wife "has not done well."   Other Other (See Comments)    Vicryl sutures, pte. Doesn't know what reaction he had, "Drs. Owens Shark + Yeatts deetrmined not to use it on me"    Outpatient Encounter Medications as of 12/02/2021  Medication Sig   acetaminophen (TYLENOL) 325 MG tablet Take 650 mg by mouth every 4 (four) hours as needed.   citalopram (CELEXA) 10 MG tablet Take 1 tablet (10 mg total) by mouth daily.   ketoconazole (NIZORAL) 2  % shampoo Apply 1 application topically 3 (three) times a week.   metoprolol succinate (TOPROL-XL) 50 MG 24 hr tablet Take 1 tablet (50 mg total) by mouth daily. Take with or immediately following a meal.   niacinamide 500 MG tablet Take 500 mg by mouth 2 (two) times daily with a meal.   ofloxacin (OCUFLOX) 0.3 % ophthalmic solution Place 1 drop into the right eye 2 (two) times daily.   ramipril (ALTACE) 2.5 MG capsule Take 2.5 mg by mouth daily.   rivastigmine (EXELON) 4.6 mg/24hr Place 1 patch (4.6 mg total) onto the skin daily.   tamsulosin (FLOMAX) 0.4 MG CAPS capsule Take 1 capsule (0.4 mg total) by mouth daily.   triamcinolone cream (KENALOG) 0.1 % Apply 1 application topically daily.   valACYclovir (VALTREX) 500 MG tablet Take 500 mg by mouth 2 (two) times daily as needed. Flare up   No facility-administered encounter medications on file as of 12/02/2021.    Review of Systems  Constitutional:  Positive for activity change, appetite change and fatigue. Negative for chills, diaphoresis, fever and unexpected weight change.  HENT:  Negative for congestion.   Respiratory:  Negative for cough, shortness of breath, wheezing and stridor.   Cardiovascular:  Negative for chest pain, palpitations and leg swelling.  Gastrointestinal:  Negative for abdominal distention, abdominal pain, constipation and diarrhea.  Genitourinary:  Positive for hematuria. Negative for difficulty urinating, dysuria, flank pain and frequency.  Musculoskeletal:  Positive for gait problem. Negative for arthralgias, back pain, joint swelling and myalgias.  Neurological:  Negative for dizziness, seizures, syncope, facial asymmetry, speech difficulty, weakness and headaches.  Hematological:  Negative for adenopathy. Does not bruise/bleed easily.  Psychiatric/Behavioral:  Positive for agitation and confusion.    Immunization History  Administered Date(s) Administered   Influenza Split 05/02/2010, 07/31/2011, 09/20/2012,  11/13/2012, 10/26/2014   Influenza, High Dose Seasonal PF 10/26/2014, 10/06/2015   Influenza, Quadrivalent, Recombinant, Inj, Pf 08/21/2018, 08/16/2020   Influenza-Unspecified 10/06/2015, 08/16/2017   PFIZER(Purple Top)SARS-COV-2 Vaccination 12/25/2019   Pneumococcal Conjugate-13 10/24/2013   Pneumococcal Polysaccharide-23 01/29/2003, 10/26/2009   Td,absorbed, Preservative Free, Adult Use, Lf Unspecified 05/23/2004, 11/20/2012   Tdap 10/26/2014, 06/03/2019   Unspecified SARS-COV-2 Vaccination 11/25/2019   Zoster Recombinat (Shingrix) 12/27/2017, 02/26/2018   Zoster, Live 12/04/2007, 02/26/2018   Pertinent  Health Maintenance Due  Topic Date Due   INFLUENZA VACCINE  06/13/2021   Fall Risk 11/24/2021 11/25/2021 11/25/2021 11/26/2021 11/29/2021  Falls in the past year? - - - - -  Was there an injury with Fall? - - - - -  Fall Risk Category Calculator - - - - -  Fall Risk Category - - - - -  Patient Fall Risk Level High fall risk High fall risk High fall risk High fall risk High fall risk   Functional Status Survey:    There were no vitals filed  for this visit.  There is no height or weight on file to calculate BMI. Physical Exam Vitals reviewed.  Constitutional:      General: He is not in acute distress.    Appearance: He is not diaphoretic.  HENT:     Head: Normocephalic and atraumatic.     Mouth/Throat:     Mouth: Mucous membranes are moist.     Pharynx: Oropharynx is clear.  Eyes:     Conjunctiva/sclera: Conjunctivae normal.     Pupils: Pupils are equal, round, and reactive to light.  Neck:     Thyroid: No thyromegaly.     Vascular: No JVD.     Trachea: No tracheal deviation.  Cardiovascular:     Rate and Rhythm: Normal rate and regular rhythm.     Heart sounds: No murmur heard. Pulmonary:     Effort: Pulmonary effort is normal. No respiratory distress.     Breath sounds: Normal breath sounds. No wheezing.     Comments: Slight increased wob Abdominal:     General:  Bowel sounds are normal. There is no distension.     Palpations: Abdomen is soft.     Tenderness: There is no abdominal tenderness. There is no right CVA tenderness or left CVA tenderness.  Genitourinary:    Comments: Bloody tinged urine in bag Musculoskeletal:     Right lower leg: No edema.     Left lower leg: No edema.  Lymphadenopathy:     Cervical: No cervical adenopathy.  Skin:    General: Skin is warm and dry.  Neurological:     General: No focal deficit present.     Mental Status: He is alert.     Cranial Nerves: No cranial nerve deficit.     Comments: Difficulty following commands for full exam.  No obvious focal deficit. Able to open eyes then falls back asleep. Equal grip strength. Scar to right eye and right side of face from prior surgery due to melanoma.     Labs reviewed: Recent Labs    11/23/21 0349 11/23/21 2328 11/24/21 0203  NA 138 132* 133*  K 4.2 3.6 3.6  CL 106 97* 99  CO2 23 24 24   GLUCOSE 101* 152* 142*  BUN 15 15 15   CREATININE 1.00 1.04 0.94  CALCIUM 8.5* 8.5* 8.6*  MG  --  1.8  --     Recent Labs    03/05/21 1008  AST 16  ALT 16  ALKPHOS 50  BILITOT 1.1  PROT 6.3*  ALBUMIN 4.1    Recent Labs    03/05/21 1008 11/21/21 1050 11/22/21 1402 11/23/21 0349 11/24/21 0203  WBC 8.5 11.1* 12.1* 10.0 12.5*  NEUTROABS 7.5 9.1*  --   --  10.2*  HGB 15.8 17.7* 15.9 15.9 14.4  HCT 47.5 54.0* 48.7 48.4 43.4  MCV 90.8 93.3 90.9 92.0 90.4  PLT 152 198 199 181 168    Lab Results  Component Value Date   TSH 0.476 11/21/2021   Lab Results  Component Value Date   HGBA1C 5.2 11/10/2008   Lab Results  Component Value Date   CHOL 125 02/04/2008   HDL 42.4 02/04/2008   LDLCALC 77 02/04/2008   TRIG 28 02/04/2008   CHOLHDL 2.9 CALC 02/04/2008    Significant Diagnostic Results in last 30 days:  DG Wrist 2 Views Right  Result Date: 11/21/2021 CLINICAL DATA:  Fall, dementia EXAM: RIGHT WRIST - 2 VIEW COMPARISON:  None. FINDINGS: There is no  evidence of  fracture or dislocation. There is no evidence of arthropathy or other focal bone abnormality. Soft tissues are unremarkable. IMPRESSION: Negative. Electronically Signed   By: Keane Police D.O.   On: 11/21/2021 16:19   DG Wrist Complete Right  Result Date: 11/21/2021 CLINICAL DATA:  Trauma, pain EXAM: RIGHT WRIST - COMPLETE 3+ VIEW COMPARISON:  None. FINDINGS: No recent fracture or dislocation is seen. Degenerative changes are noted with bony spurs in first carpometacarpal joint and the interphalangeal joint of thumb. Minimal bony spurs seen in the lateral radiocarpal joint. IMPRESSION: No recent fracture or dislocation is seen in the right wrist. Electronically Signed   By: Elmer Picker M.D.   On: 11/21/2021 11:29   CT HEAD WO CONTRAST (5MM)  Result Date: 11/23/2021 CLINICAL DATA:  Stroke follow-up. EXAM: CT HEAD WITHOUT CONTRAST TECHNIQUE: Contiguous axial images were obtained from the base of the skull through the vertex without intravenous contrast. RADIATION DOSE REDUCTION: This exam was performed according to the departmental dose-optimization program which includes automated exposure control, adjustment of the mA and/or kV according to patient size and/or use of iterative reconstruction technique. COMPARISON:  CT head 11/22/2021 FINDINGS: Brain: A parenchymal hematoma inferiorly in the posterior left temporal lobe has mildly increased in size, measuring 3.0 x 5.9 x 2.4 cm (estimated volume of 21 mL). Mild surrounding edema is unchanged, as is mass effect on the temporal horn of the left lateral ventricle. There is trace hemorrhage in the occipital horns of the lateral ventricles which has decreased. Minimal hemorrhage along the posterior aspect of the third ventricle/medial margin of the right thalamus is unchanged. Minimal subarachnoid hemorrhage near the posterior aspect of the left sylvian fissure is unchanged. Small subdural hematomas over the cerebral convexities are unchanged  and measure up to 8 mm in thickness on the right and 3 mm on the left. A small amount of subdural blood along the tentorium bilaterally is also unchanged. There is no significant midline shift. There is no evidence of an acute infarct, new hemorrhage, or hydrocephalus. Patchy hypodensities in the cerebral white matter bilaterally are unchanged and nonspecific but compatible with moderate chronic small vessel ischemic disease. There is mild cerebral atrophy. Vascular: Calcified atherosclerosis at the skull base. Skull: No acute fracture or suspicious osseous lesion. Sinuses/Orbits: Unchanged large right mastoid effusion. Clear paranasal sinuses. Bilateral cataract extraction. Right eyelid weight. Other: Posterior scalp skin staples with mild swelling/contusion. Postoperative changes in the right parotid region. IMPRESSION: 1. Mild enlargement of the left temporal lobe hematoma. Unchanged mild edema. 2. Unchanged small subdural hematomas and minimal subarachnoid hemorrhage. 3. Decreased intraventricular hemorrhage. Electronically Signed   By: Logan Bores M.D.   On: 11/23/2021 13:31   CT HEAD WO CONTRAST (5MM)  Result Date: 11/22/2021 CLINICAL DATA:  Intracranial bleeding, trauma EXAM: CT HEAD WITHOUT CONTRAST TECHNIQUE: Contiguous axial images were obtained from the base of the skull through the vertex without intravenous contrast. COMPARISON:  11/21/2021 FINDINGS: Brain: There is interval appearance of large lobulated intra cerebral hematoma measuring a proximally 4.7 x 2.9 x 2.8 cm in the left posterior temporal lobe. Estimated volume is approximately 19 mL. There is surrounding edema. There is 6 mm area of bleeding in the right posterior third ventricle. There is interval layering of small amount of blood in the dependent portions of occipital lobes in both lateral ventricles. Small 3 mm subdural hematoma seen in the periphery of left cerebral hemisphere is less evident. There is increase in amount of CSF  density adjacent to both  frontal lobes, more so on the right side measuring up to 10 mm in the diameter. There is no shift of midline structures. There is increased density between the leaves tentorium with interval worsening more so on the left side. There is subtle increased density in the posterior right sylvian fissure, possibly presence of subarachnoid hemorrhage. Similar finding to a lesser extent is seen in the posterior left sylvian fissure. Vascular: Scattered arterial calcifications are seen. Skull: No displaced fracture is seen. Sinuses/Orbits: Sinuses are unremarkable. There is a metallic density in the anterior right orbit. IMPRESSION: There is interval appearance of 4.7 x 2.9 x 2.8 cm lobulated intra cerebral hematoma in the posterior left temporal lobe. There is interval increase in the amount of blood in the third and both lateral ventricles. There is increased amount of blood between the leaves of the tentorium, more so on the left side. There is subtle increased density in the posterior aspects of sylvian fissure on both sides, more so on the right side possibly suggesting subarachnoid hemorrhage. 3 mm acute left subdural hematoma seen in the previous examination appears less prominent. There is increased amount of CSF density in the periphery of both frontal lobes, more so on the right side measuring up to 10 mm in thickness without definite evidence of recent bleeding. Imaging findings were relayed to Dr. Tyrell Antonio by telephone call. She will contact neurosurgeon. Electronically Signed   By: Elmer Picker M.D.   On: 11/22/2021 11:41   CT Head Wo Contrast  Addendum Date: 11/21/2021   ADDENDUM REPORT: 11/21/2021 11:59 ADDENDUM: Posterior scalp laceration. Electronically Signed   By: Logan Bores M.D.   On: 11/21/2021 11:59   Result Date: 11/21/2021 CLINICAL DATA:  Head trauma.  Fall today.  Head and neck pain. EXAM: CT HEAD WITHOUT CONTRAST CT CERVICAL SPINE WITHOUT CONTRAST TECHNIQUE:  Multidetector CT imaging of the head and cervical spine was performed following the standard protocol without intravenous contrast. Multiplanar CT image reconstructions of the cervical spine were also generated. COMPARISON:  CT head and cervical spine 03/05/2021 FINDINGS: CT HEAD FINDINGS Brain: A new small hyperdense subdural hematoma over the left lateral cerebral convexity measures up to 3 mm in thickness without mass effect. Trace subdural hemorrhage is also present along the posterior aspect of the falx and along the left greater than right tentorial leaflets, and there is also new trace hypoattenuating subdural fluid or blood over the right cerebral convexity. There is minimal acute hemorrhage in the fourth ventricle. There is also a small extra-axial focus of curvilinear hyperdensity along the posterolateral aspect of third ventricle coursing towards the pineal region (series 4, image 16). No acute cortically based infarct or midline shift is evident. There is mild cerebral atrophy. Patchy hypodensities in the cerebral white matter bilaterally are unchanged and nonspecific but compatible with moderate chronic small vessel ischemic disease. Vascular: Calcified atherosclerosis at the skull base. Skull: No acute fracture or suspicious osseous lesion. Sinuses/Orbits: Clear paranasal sinuses. Unchanged large right mastoid effusion. Right eyelid weight. Bilateral cataract extraction. Other: Postoperative changes involving the right parotid gland. CT CERVICAL SPINE FINDINGS Alignment: Chronic trace anterolisthesis of C7 on T1. Skull base and vertebrae: No acute fracture or suspicious osseous lesion. Soft tissues and spinal canal: No prevertebral fluid or swelling. No visible canal hematoma. Disc levels: Multilevel disc degeneration, moderate at C6-7. Asymmetrically advanced right-sided cervical facet arthrosis at multiple levels. Right facet ankylosis at C2-3. Moderate multilevel neural foraminal stenosis and at  least mild multilevel spinal stenosis. Upper  chest: Clear lung apices. Other: Postoperative changes in the right upper neck. IMPRESSION: 1. Small acute bilateral subdural hematomas without mass effect. 2. Small volume acute hemorrhage in the fourth ventricle. 3. Small hyperdensity along the posterolateral aspect of the third ventricle on the right, likely additional extra-axial hemorrhage although deep cerebral vein thrombosis is an alternative consideration. 4. Moderate chronic small vessel ischemic disease. 5. No acute cervical spine fracture. Critical Value/emergent results were called by telephone at the time of interpretation on 11/21/2021 at 11:47 am to provider Myna Bright, who verbally acknowledged these results. Electronically Signed: By: Logan Bores M.D. On: 11/21/2021 11:54   CT Cervical Spine Wo Contrast  Addendum Date: 11/21/2021   ADDENDUM REPORT: 11/21/2021 11:59 ADDENDUM: Posterior scalp laceration. Electronically Signed   By: Logan Bores M.D.   On: 11/21/2021 11:59   Result Date: 11/21/2021 CLINICAL DATA:  Head trauma.  Fall today.  Head and neck pain. EXAM: CT HEAD WITHOUT CONTRAST CT CERVICAL SPINE WITHOUT CONTRAST TECHNIQUE: Multidetector CT imaging of the head and cervical spine was performed following the standard protocol without intravenous contrast. Multiplanar CT image reconstructions of the cervical spine were also generated. COMPARISON:  CT head and cervical spine 03/05/2021 FINDINGS: CT HEAD FINDINGS Brain: A new small hyperdense subdural hematoma over the left lateral cerebral convexity measures up to 3 mm in thickness without mass effect. Trace subdural hemorrhage is also present along the posterior aspect of the falx and along the left greater than right tentorial leaflets, and there is also new trace hypoattenuating subdural fluid or blood over the right cerebral convexity. There is minimal acute hemorrhage in the fourth ventricle. There is also a small extra-axial focus of  curvilinear hyperdensity along the posterolateral aspect of third ventricle coursing towards the pineal region (series 4, image 16). No acute cortically based infarct or midline shift is evident. There is mild cerebral atrophy. Patchy hypodensities in the cerebral white matter bilaterally are unchanged and nonspecific but compatible with moderate chronic small vessel ischemic disease. Vascular: Calcified atherosclerosis at the skull base. Skull: No acute fracture or suspicious osseous lesion. Sinuses/Orbits: Clear paranasal sinuses. Unchanged large right mastoid effusion. Right eyelid weight. Bilateral cataract extraction. Other: Postoperative changes involving the right parotid gland. CT CERVICAL SPINE FINDINGS Alignment: Chronic trace anterolisthesis of C7 on T1. Skull base and vertebrae: No acute fracture or suspicious osseous lesion. Soft tissues and spinal canal: No prevertebral fluid or swelling. No visible canal hematoma. Disc levels: Multilevel disc degeneration, moderate at C6-7. Asymmetrically advanced right-sided cervical facet arthrosis at multiple levels. Right facet ankylosis at C2-3. Moderate multilevel neural foraminal stenosis and at least mild multilevel spinal stenosis. Upper chest: Clear lung apices. Other: Postoperative changes in the right upper neck. IMPRESSION: 1. Small acute bilateral subdural hematomas without mass effect. 2. Small volume acute hemorrhage in the fourth ventricle. 3. Small hyperdensity along the posterolateral aspect of the third ventricle on the right, likely additional extra-axial hemorrhage although deep cerebral vein thrombosis is an alternative consideration. 4. Moderate chronic small vessel ischemic disease. 5. No acute cervical spine fracture. Critical Value/emergent results were called by telephone at the time of interpretation on 11/21/2021 at 11:47 am to provider Myna Bright, who verbally acknowledged these results. Electronically Signed: By: Logan Bores M.D. On:  11/21/2021 11:54   ECHOCARDIOGRAM COMPLETE  Result Date: 11/22/2021    ECHOCARDIOGRAM REPORT   Patient Name:   Jeffrey [name] [facility] Date of Exam: 11/22/2021 Medical Rec #:  [id number]  Height:       72.0 in Accession #:    [id number]       Weight:       170.5 lb Date of Birth:  06-02-1941        BSA:          1.991 m Patient Age:    24 years         BP:           107/67 mmHg Patient Gender: M                HR:           82 bpm. Exam Location:  Inpatient Procedure: 2D Echo, Cardiac Doppler and Color Doppler Indications:    syncope  History:        Patient has prior history of Echocardiogram examinations, most                 recent 08/29/2019. Risk Factors:Hypertension. Hyperlipidemia.  Sonographer:    Beryle Beams Referring Phys: [id number] [place]  1. Left ventricular ejection fraction, by estimation, is 55 to 60%. The left ventricle has normal function. Left ventricular endocardial border not optimally defined to evaluate regional wall motion. There may be hypokinesis of the apical segments of the inferior wall and inferior septum. Left ventricular diastolic parameters are consistent with Grade I diastolic dysfunction (impaired relaxation).  2. Right ventricular systolic function is normal. The right ventricular size is normal.  3. The mitral valve is normal in structure. No evidence of mitral valve regurgitation. No evidence of mitral stenosis.  4. The aortic valve is normal in structure. Aortic valve regurgitation is not visualized. No aortic stenosis is present.  5. The inferior vena cava is normal in size with greater than 50% respiratory variability, suggesting right atrial pressure of 3 mmHg. FINDINGS  Left Ventricle: Left ventricular ejection fraction, by estimation, is 55 to 60%. The left ventricle has normal function. Left ventricular endocardial border not optimally defined to evaluate regional wall motion. The left ventricular internal cavity size was normal in size. There is no  left ventricular hypertrophy. Left ventricular diastolic parameters are consistent with Grade I diastolic dysfunction (impaired relaxation). Indeterminate filling pressures. Right Ventricle: Ivc not seen. The right ventricular size is normal. No increase in right ventricular wall thickness. Right ventricular systolic function is normal. Left Atrium: Left atrial size was normal in size. Right Atrium: Right atrial size was normal in size. Pericardium: Trivial pericardial effusion is present. Mitral Valve: The mitral valve is normal in structure. No evidence of mitral valve regurgitation. No evidence of mitral valve stenosis. Tricuspid Valve: The tricuspid valve is normal in structure. Tricuspid valve regurgitation is not demonstrated. No evidence of tricuspid stenosis. Aortic Valve: The aortic valve is normal in structure. Aortic valve regurgitation is not visualized. No aortic stenosis is present. Aortic valve mean gradient measures 3.0 mmHg. Aortic valve peak gradient measures 5.1 mmHg. Aortic valve area, by VTI measures 2.33 cm. Pulmonic Valve: The pulmonic valve was normal in structure. Pulmonic valve regurgitation is not visualized. No evidence of pulmonic stenosis. Aorta: The aortic root is normal in size and structure. Venous: The inferior vena cava is normal in size with greater than 50% respiratory variability, suggesting right atrial pressure of 3 mmHg. IAS/Shunts: No atrial level shunt detected by color flow Doppler.  LEFT VENTRICLE PLAX 2D LVIDd:         4.80 cm     Diastology LVIDs:  2.80 cm     LV e' medial:    4.46 cm/s LV PW:         1.10 cm     LV E/e' medial:  12.1 LV IVS:        0.70 cm     LV e' lateral:   4.46 cm/s LVOT diam:     1.80 cm     LV E/e' lateral: 12.1 LV SV:         48 LV SV Index:   24 LVOT Area:     2.54 cm  LV Volumes (MOD) LV vol d, MOD A2C: 35.3 ml LV vol d, MOD A4C: 42.3 ml LV vol s, MOD A2C: 13.3 ml LV vol s, MOD A4C: 19.1 ml LV SV MOD A2C:     22.0 ml LV SV MOD A4C:      42.3 ml LV SV MOD BP:      24.4 ml RIGHT VENTRICLE RV S prime:     9.36 cm/s TAPSE (M-mode): 2.0 cm LEFT ATRIUM           Index        RIGHT ATRIUM          Index LA diam:      2.70 cm 1.36 cm/m   RA Area:     6.72 cm LA Vol (A2C): 33.8 ml 16.98 ml/m  RA Volume:   9.86 ml  4.95 ml/m LA Vol (A4C): 21.7 ml 10.90 ml/m  AORTIC VALVE                    PULMONIC VALVE AV Area (Vmax):    2.32 cm     PV Vmax:       0.88 m/s AV Area (Vmean):   2.60 cm     PV Peak grad:  3.1 mmHg AV Area (VTI):     2.33 cm AV Vmax:           113.00 cm/s AV Vmean:          75.100 cm/s AV VTI:            0.205 m AV Peak Grad:      5.1 mmHg AV Mean Grad:      3.0 mmHg LVOT Vmax:         103.00 cm/s LVOT Vmean:        76.700 cm/s LVOT VTI:          0.188 m LVOT/AV VTI ratio: 0.92  AORTA Ao Root diam: 2.60 cm Ao Asc diam:  2.50 cm MITRAL VALVE                TRICUSPID VALVE MV Area (PHT): 5.54 cm     TV Peak grad:   13.4 mmHg MV Decel Time: 137 msec     TV Mean grad:   11.0 mmHg MV E velocity: 53.90 cm/s   TV Vmax:        1.83 m/s MV A velocity: 103.00 cm/s  TV Vmean:       159.0 cm/s MV E/A ratio:  0.52         TV VTI:         0.58 msec                              SHUNTS  Systemic VTI:  0.19 m                             Systemic Diam: 1.80 cm Sanda Klein MD Electronically signed by Sanda Klein MD Signature Date/Time: 11/22/2021/10:29:33 AM    Final    VAS US CAROTID  Result Date: 11/22/2021 Carotid Arterial Duplex Study Patient Name:  CLEDITH ABDOU [facility]  Date of Exam:   11/22/2021 Medical Rec #: [id number]         Accession #:    [id number] Date of Birth: 1941-04-22         Patient Gender: M Patient Age:   61 years Exam Location:  [facility] Procedure:      VAS US CAROTID Referring Phys: Orma Flaming --------------------------------------------------------------------------------  Indications:       History of carotid stenosis. Risk Factors:      Hypertension, hyperlipidemia, past history of  smoking. Other Factors:     Dementia, multiple falls. Comparison Study:  09-08-2020 Most recent prior carotid duplex consistent with                    50-69% ICA stenosis bilaterally. Performing Technologist: Darlin Coco RDMS, RVT  Examination Guidelines: A complete evaluation includes B-mode imaging, spectral Doppler, color Doppler, and power Doppler as needed of all accessible portions of each vessel. Bilateral testing is considered an integral part of a complete examination. Limited examinations for reoccurring indications may be performed as noted.  Right Carotid Findings: +----------+--------+--------+--------+------------------+---------------------+             PSV cm/s EDV cm/s Stenosis Plaque Description Comments               +----------+--------+--------+--------+------------------+---------------------+  CCA Prox   64       21                                   intimal thickening     +----------+--------+--------+--------+------------------+---------------------+  CCA Distal 50       17                hyperechoic,                                                                     heterogenous and                                                                 irregular                                 +----------+--------+--------+--------+------------------+---------------------+  ICA Prox   289      66       60-79%                                             +----------+--------+--------+--------+------------------+---------------------+  ICA Mid    287      49                                   post-stenotic                                                                    turbulence             +----------+--------+--------+--------+------------------+---------------------+  ICA Distal 54       16                                   tortuous               +----------+--------+--------+--------+------------------+---------------------+  ECA        72       22                                                           +----------+--------+--------+--------+------------------+---------------------+ +----------+--------+-------+----------------+-------------------+             PSV cm/s EDV cms Describe         Arm Pressure (mmHG)  +----------+--------+-------+----------------+-------------------+  Subclavian 155              Multiphasic, WNL                      +----------+--------+-------+----------------+-------------------+ +---------+--------+--+--------+--+---------+  Vertebral PSV cm/s 46 EDV cm/s 11 Antegrade  +---------+--------+--+--------+--+---------+  Left Carotid Findings: +----------+--------+--------+--------+-------------------+------------------+             PSV cm/s EDV cm/s Stenosis Plaque Description  Comments            +----------+--------+--------+--------+-------------------+------------------+  CCA Prox   67       15                                                        +----------+--------+--------+--------+-------------------+------------------+  CCA Distal 72       23                                    intimal thickening  +----------+--------+--------+--------+-------------------+------------------+  ICA Prox   67       21       1-39%    heterogenous , mild                     +----------+--------+--------+--------+-------------------+------------------+  ICA Mid    54       19                                                        +----------+--------+--------+--------+-------------------+------------------+  ICA Distal 58       22                                                        +----------+--------+--------+--------+-------------------+------------------+  ECA        154      27       >50%                                             +----------+--------+--------+--------+-------------------+------------------+ +----------+--------+--------+----------------+-------------------+             PSV cm/s EDV cm/s Describe         Arm Pressure (mmHG)   +----------+--------+--------+----------------+-------------------+  Subclavian 118               Multiphasic, WNL                      +----------+--------+--------+----------------+-------------------+ +---------+--------+--+--------+--+---------+  Vertebral PSV cm/s 40 EDV cm/s 11 Antegrade  +---------+--------+--+--------+--+---------+   Summary: Right Carotid: Velocities in the right ICA are consistent with a 60-79%                stenosis. Left Carotid: Velocities in the left ICA are consistent with a 1-39% stenosis.               The ECA appears >50% stenosed. Vertebrals:  Bilateral vertebral arteries demonstrate antegrade flow. Subclavians: Normal flow hemodynamics were seen in bilateral subclavian              arteries. *See table(s) above for measurements and observations.  Electronically signed by Harold Barban MD on 11/22/2021 at 9:42:31 PM.    Final     Assessment/Plan 1. Hyponatremia\ Initially was 122, repeat lab 129 ?SIADH due to TBI/head bleed and/or poor intake He has adequate urine output. Poor intake. Discussed options with his daughter. Due to his declining status that we like to avoid hospitalization and treat him here at [facility]. They are aware of the risk of cardiac event, seizure, and death. They accept the risk given that he has been to the ER several times and they would like to avoid aggressive care and hospitalizations. DNR in place.  Will give NS 80 cc/hr x 1 liters. Follow BMP daily x 3 days. We will try a slow correction. They are aware that we can not do frequent labs and do not have the same access to meds and rapid care that the hospital is able to provide.   2. Subdural hematoma Due for f/u head CT 1/27.   ? If this is worsening leading to his lethargy along with the low sodium. No aggressive care per family    3. Moderate vascular dementia with agitation Prior to the fall he was ambulatory and lived in [place] with his wife, quite functional per her account. Recent  MOCA 6/30 after fall with subdural. Currently on exelon. Followed by neurology. He has a DNR and a most form with  limited intervention. We agreed to try IVF. Later his daughter requested a hospice consult. This is appropriate in this setting given his prognosis and goals of care. Will order.   4. Urinary retention Has BPH on flomax Continues with foley  5. Gross hematuria Improving after traumatic insertion/removal Prior UA on 1/9 neg Just completed [facility] did not obtain UA C and S, will order.    6. Frequent falls Due to dementia and poor balance Not able to work with PT  7. Paroxysmal atrial fibrillation (HCC) Regular on exam Not on anticoagulation due subdural hematoma and falls ON toprol  8. HTN  I asked for another set of vitals but the nurse informed me that the family has decided not to monitor vitals.   They also asked that we discontinue the IV orders and labs Morphine and ativan will be called in for comfort for agitation and sob.    Family/ staff Communication: discussed with his wife at the bedside Sissy and his daughter Carola Rhine PA Also discussed care with Dr. Lyndel Safe.  Labs/tests ordered:  urine osmolality urine sodium. UA C and S. BMP daily x 3 d

## 2021-12-03 ENCOUNTER — Encounter: Payer: Self-pay | Admitting: Neurology

## 2021-12-09 ENCOUNTER — Other Ambulatory Visit: Payer: PPO

## 2021-12-14 ENCOUNTER — Ambulatory Visit: Payer: PPO | Admitting: Neurology

## 2021-12-14 DEATH — deceased

## 2022-01-27 ENCOUNTER — Telehealth: Payer: Self-pay | Admitting: Neurology

## 2022-01-27 NOTE — Telephone Encounter (Signed)
Re: an appeal for [place] claim, pt's wife is asking for a call from [place], [place] on Monday ?

## 2022-01-30 NOTE — Telephone Encounter (Signed)
Called the wife back. She states a while back we had completed some paper work in hopes of getting financial assistance to help with the aides. She has been encouraged on re submitting.  ?She is asking that last 3 ov notes be printed for her to pick up and then she will bring paperwork later in week.  ?

## 2022-02-07 ENCOUNTER — Ambulatory Visit: Payer: PPO | Admitting: Neurology

## 2022-03-28 ENCOUNTER — Ambulatory Visit: Payer: PPO | Admitting: Neurology

## 2022-06-12 IMAGING — CT CT CERVICAL SPINE W/O CM
3 of 4 series · 10 of 33 positions shown, 12 images · non-contrast
Comparison: CT head and cervical spine 03/05/2021
COMPARISON: CT head and cervical spine 03/05/2021

Addendum:
CLINICAL DATA: Head trauma.  Fall today.  Head and neck pain.

EXAM:
CT HEAD WITHOUT CONTRAST
CT CERVICAL SPINE WITHOUT CONTRAST
TECHNIQUE: Multidetector CT imaging of the head and cervical spine was
performed following the standard protocol without intravenous
contrast. Multiplanar CT image reconstructions of the cervical spine
were also generated.

[Series 5: c_spine 2.0 st · axial · 0.47mm/px · z∈[-201,-129]mm · 2 of 109 slices shown, 3 images]
[im 37/109  soft-tissue]
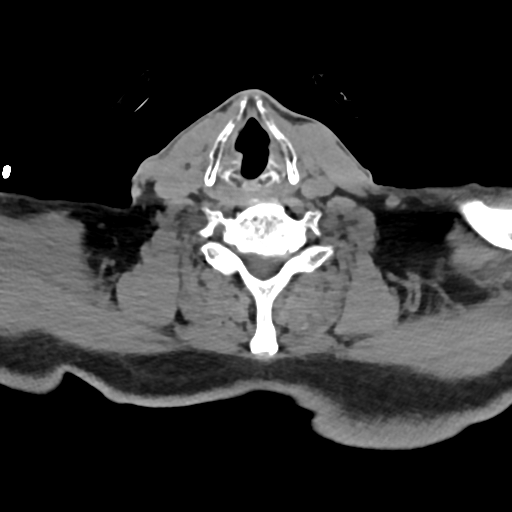
[im 37/109  bone]
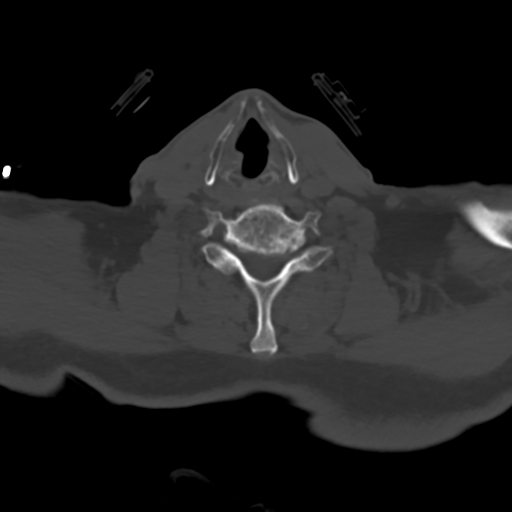
[im 73/109  bone]
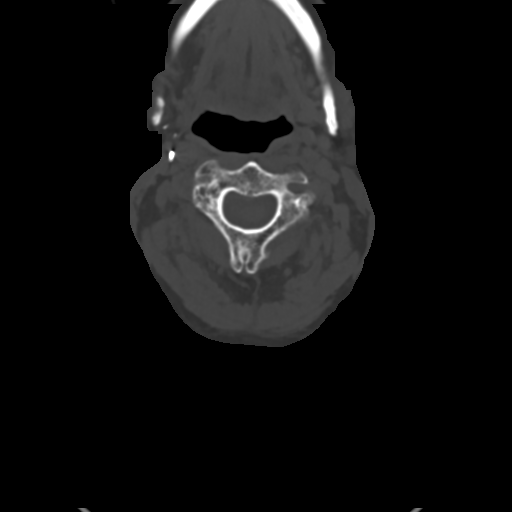

[Series 6: coronal bone · coronal · 0.32mm/px · 3 of 81 slices shown]
[im 17/81  bone]
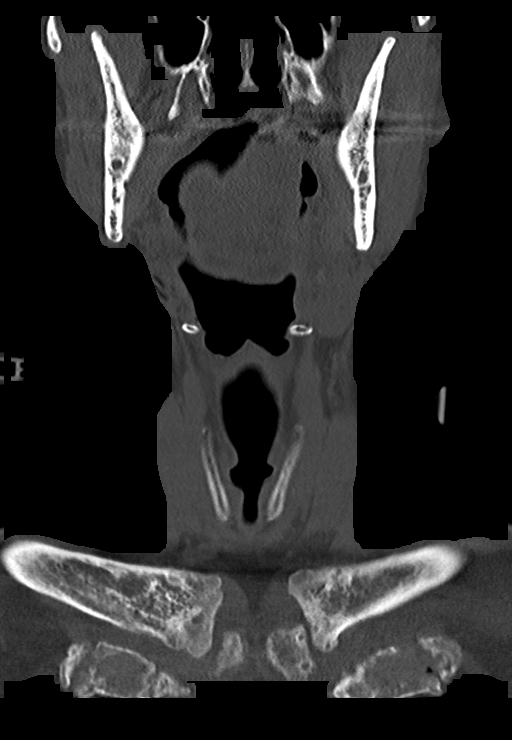
[im 33/81  bone]
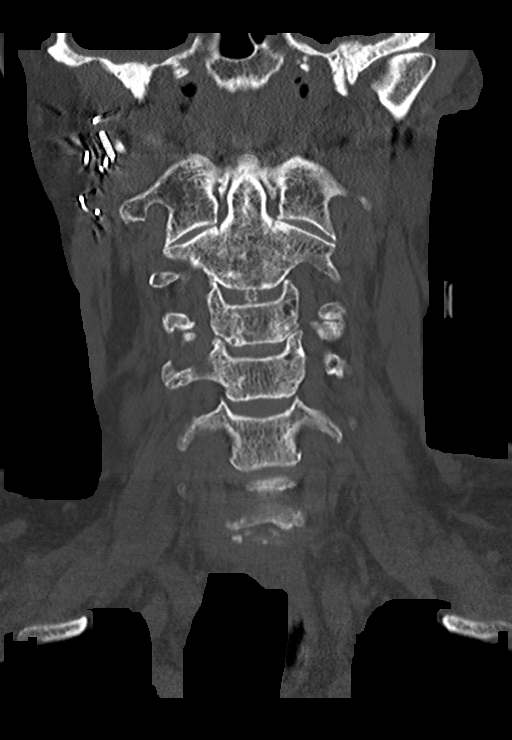
[im 49/81  bone]
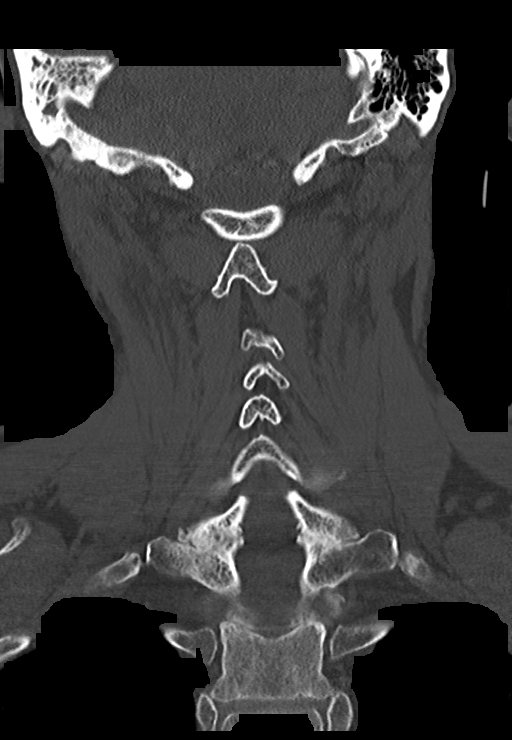

[Series 7: sagittal bone · sagittal · 0.32mm/px · 5 of 77 slices shown, 6 images]
[im 26/77  bone]
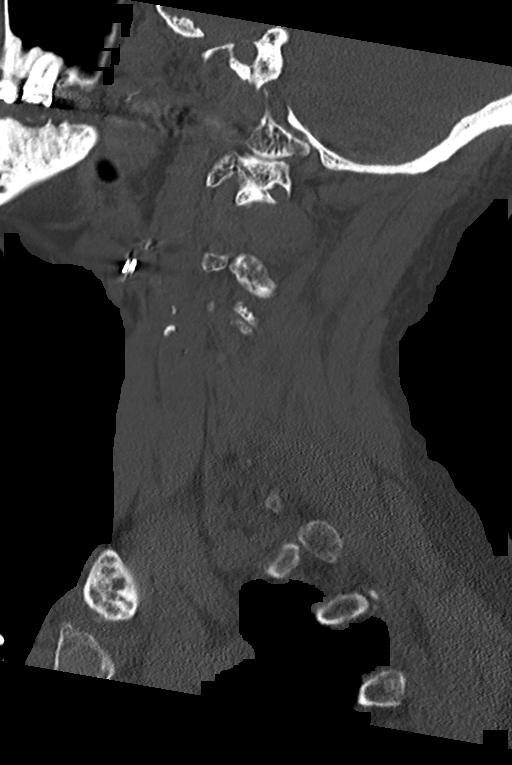
[im 32/77  bone]
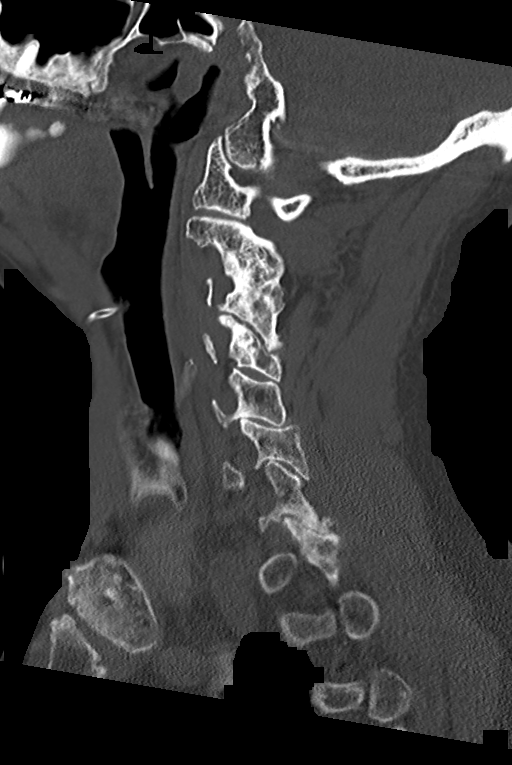
[im 39/77  soft-tissue]
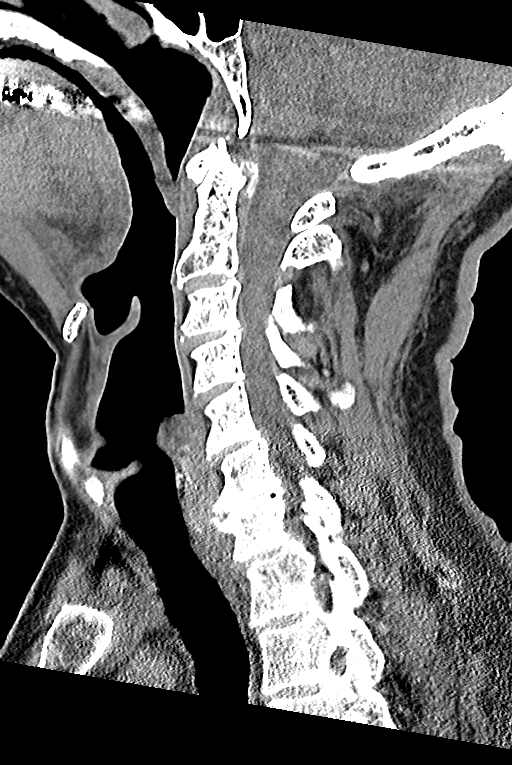
[im 39/77  bone]
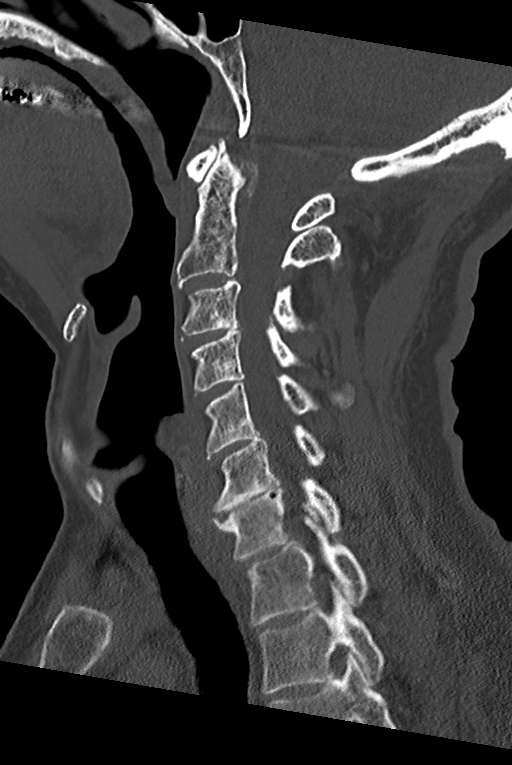
[im 45/77  bone]
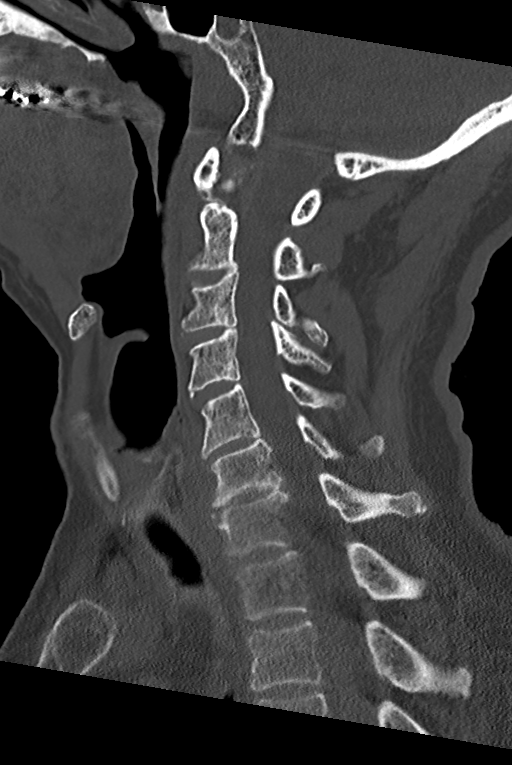
[im 51/77  bone]
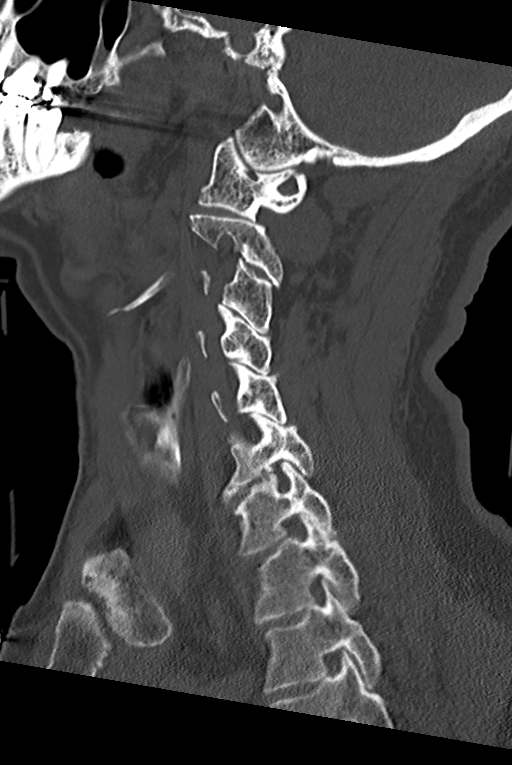

[10 of 33 positions shown; findings below may reference images not displayed]

FINDINGS: CT HEAD FINDINGS

Brain: A new small hyperdense subdural hematoma over the left
lateral cerebral convexity measures up to 3 mm in thickness without
mass effect. Trace subdural hemorrhage is also present along the
posterior aspect of the falx and along the left greater than right
tentorial leaflets, and there is also new trace hypoattenuating
subdural fluid or blood over the right cerebral convexity. There is
minimal acute hemorrhage in the fourth ventricle. There is also a
small extra-axial focus of curvilinear hyperdensity along the
posterolateral aspect of third ventricle coursing towards the pineal
region (series 4, image 16). No acute cortically based infarct or
midline shift is evident. There is mild cerebral atrophy. Patchy
hypodensities in the cerebral white matter bilaterally are unchanged
and nonspecific but compatible with moderate chronic small vessel
ischemic disease.

Vascular: Calcified atherosclerosis at the skull base.

Skull: No acute fracture or suspicious osseous lesion.

Sinuses/Orbits: Clear paranasal sinuses. Unchanged large right
mastoid effusion. Right eyelid weight. Bilateral cataract
extraction.

Other: Postoperative changes involving the right parotid gland.

CT CERVICAL SPINE FINDINGS

Alignment: Chronic trace anterolisthesis of C7 on T1.

Skull base and vertebrae: No acute fracture or suspicious osseous
lesion.

Soft tissues and spinal canal: No prevertebral fluid or swelling. No
visible canal hematoma.

Disc levels: Multilevel disc degeneration, moderate at C6-7.
Asymmetrically advanced right-sided cervical facet arthrosis at
multiple levels. Right facet ankylosis at C2-3. Moderate multilevel
neural foraminal stenosis and at least mild multilevel spinal
stenosis.

Upper chest: Clear lung apices.

Other: Postoperative changes in the right upper neck.
IMPRESSION: 1. Small acute bilateral subdural hematomas without mass effect.
2. Small volume acute hemorrhage in the fourth ventricle.
3. Small hyperdensity along the posterolateral aspect of the third
ventricle on the right, likely additional extra-axial hemorrhage
although deep cerebral vein thrombosis is an alternative
consideration.
4. Moderate chronic small vessel ischemic disease.
5. No acute cervical spine fracture.

Critical Value/emergent results were called by telephone at the time
of interpretation on 11/21/2021 at [DATE] to provider Katchen
Roberto, who verbally acknowledged these results.

ADDENDUM:
Posterior scalp laceration.

*** End of Addendum ***
FINDINGS: CT HEAD FINDINGS

Brain: A new small hyperdense subdural hematoma over the left
lateral cerebral convexity measures up to 3 mm in thickness without
mass effect. Trace subdural hemorrhage is also present along the
posterior aspect of the falx and along the left greater than right
tentorial leaflets, and there is also new trace hypoattenuating
subdural fluid or blood over the right cerebral convexity. There is
minimal acute hemorrhage in the fourth ventricle. There is also a
small extra-axial focus of curvilinear hyperdensity along the
posterolateral aspect of third ventricle coursing towards the pineal
region (series 4, image 16). No acute cortically based infarct or
midline shift is evident. There is mild cerebral atrophy. Patchy
hypodensities in the cerebral white matter bilaterally are unchanged
and nonspecific but compatible with moderate chronic small vessel
ischemic disease.

Vascular: Calcified atherosclerosis at the skull base.

Skull: No acute fracture or suspicious osseous lesion.

Sinuses/Orbits: Clear paranasal sinuses. Unchanged large right
mastoid effusion. Right eyelid weight. Bilateral cataract
extraction.

Other: Postoperative changes involving the right parotid gland.

CT CERVICAL SPINE FINDINGS

Alignment: Chronic trace anterolisthesis of C7 on T1.

Skull base and vertebrae: No acute fracture or suspicious osseous
lesion.

Soft tissues and spinal canal: No prevertebral fluid or swelling. No
visible canal hematoma.

Disc levels: Multilevel disc degeneration, moderate at C6-7.
Asymmetrically advanced right-sided cervical facet arthrosis at
multiple levels. Right facet ankylosis at C2-3. Moderate multilevel
neural foraminal stenosis and at least mild multilevel spinal
stenosis.

Upper chest: Clear lung apices.

Other: Postoperative changes in the right upper neck.
IMPRESSION: 1. Small acute bilateral subdural hematomas without mass effect.
2. Small volume acute hemorrhage in the fourth ventricle.
3. Small hyperdensity along the posterolateral aspect of the third
ventricle on the right, likely additional extra-axial hemorrhage
although deep cerebral vein thrombosis is an alternative
consideration.
4. Moderate chronic small vessel ischemic disease.
5. No acute cervical spine fracture.

Critical Value/emergent results were called by telephone at the time
of interpretation on 11/21/2021 at [DATE] to provider Katchen
Roberto, who verbally acknowledged these results.

## 2022-06-12 IMAGING — CT CT HEAD W/O CM
3 of 7 series · 12 of 47 positions shown, 14 images · non-contrast
Comparison: CT head and cervical spine 03/05/2021
COMPARISON: CT head and cervical spine 03/05/2021

Addendum:
CLINICAL DATA: Head trauma.  Fall today.  Head and neck pain.

EXAM:
CT HEAD WITHOUT CONTRAST
CT CERVICAL SPINE WITHOUT CONTRAST
TECHNIQUE: Multidetector CT imaging of the head and cervical spine was
performed following the standard protocol without intravenous
contrast. Multiplanar CT image reconstructions of the cervical spine
were also generated.

[Series 5: head 3.0 mpr cor · coronal · 0.36mm/px · 3 of 73 slices shown]
[im 19/73  brain]
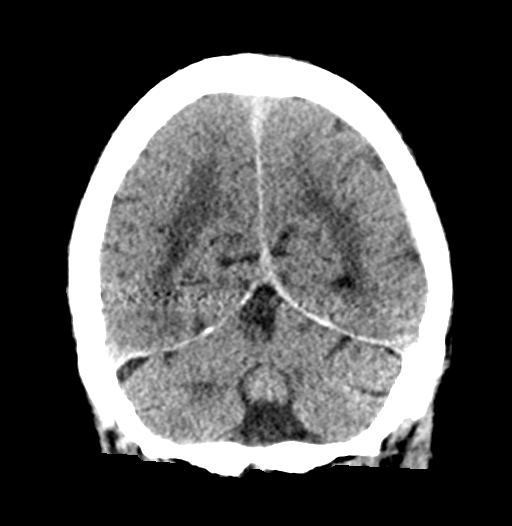
[im 37/73  brain]
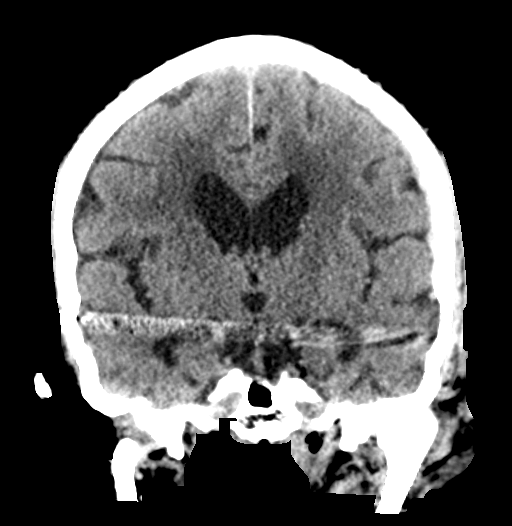
[im 55/73  brain]
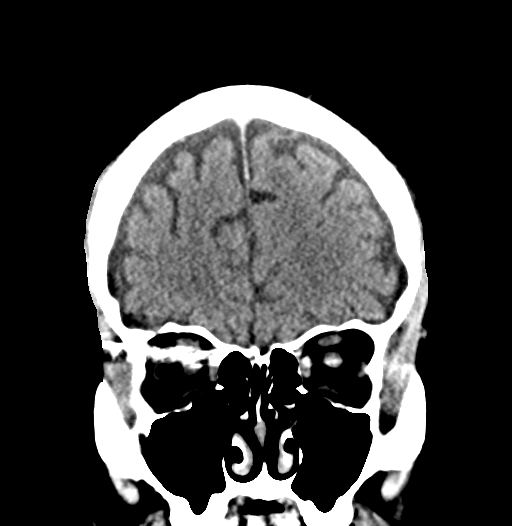

[Series 8: head 2.0 mpr ax · axial · 0.38mm/px · z∈[-36,+89]mm · 7 of 91 slices shown, 9 images]
[im 12/91  brain]
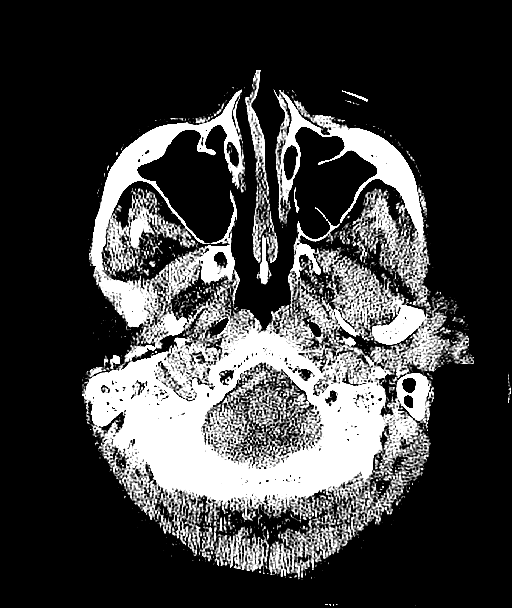
[im 12/91  bone]
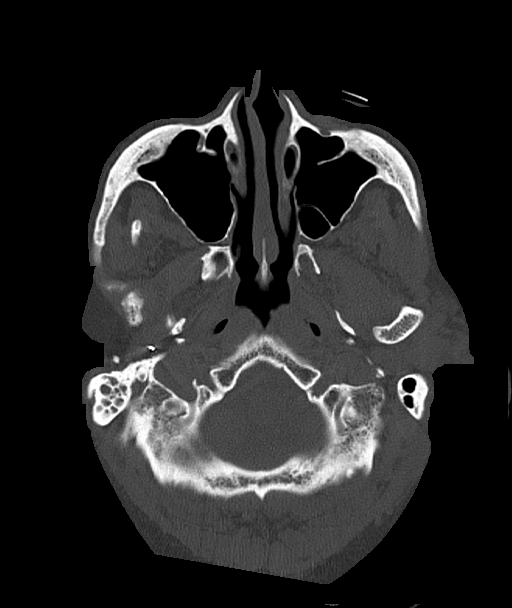
[im 23/91  brain]
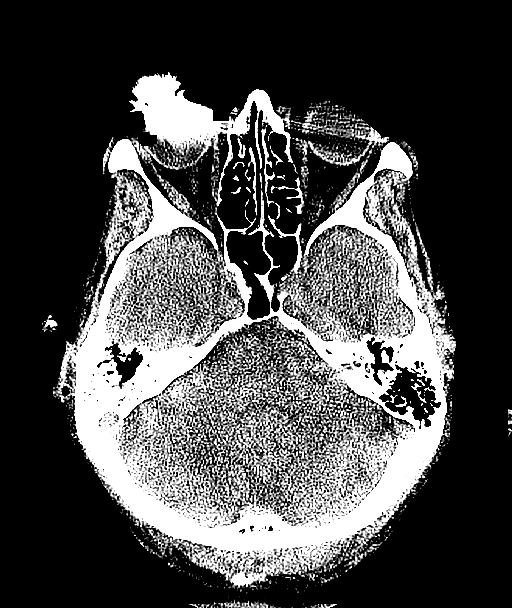
[im 34/91  brain]
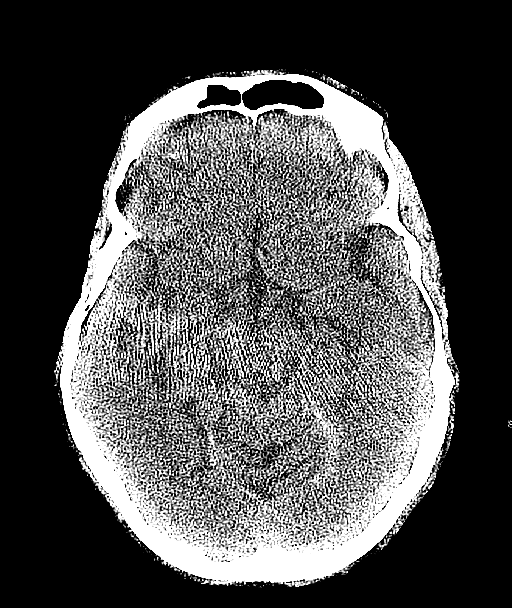
[im 46/91  brain]
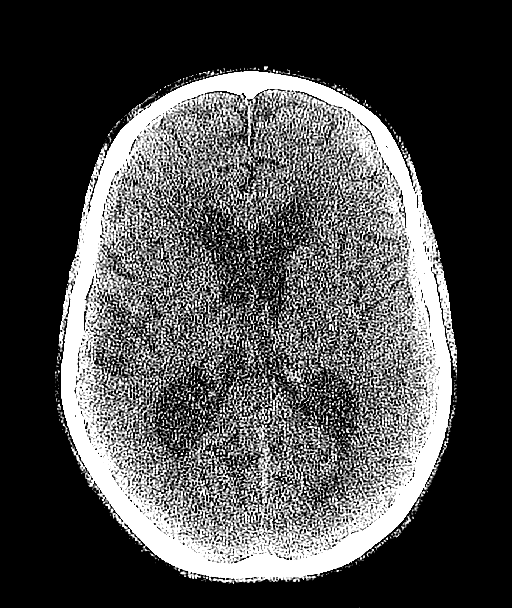
[im 57/91  brain]
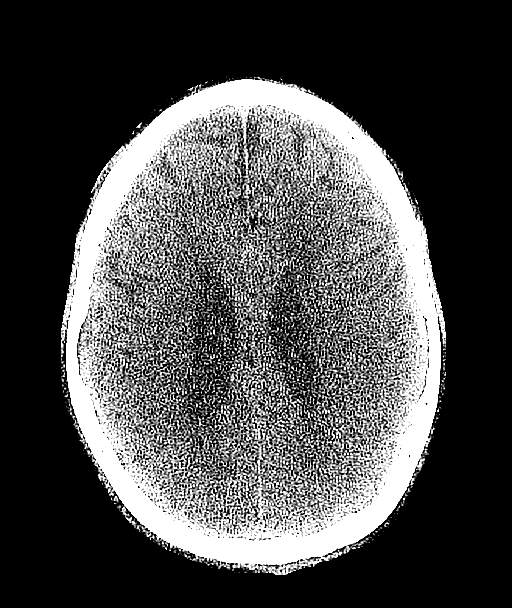
[im 57/91  bone]
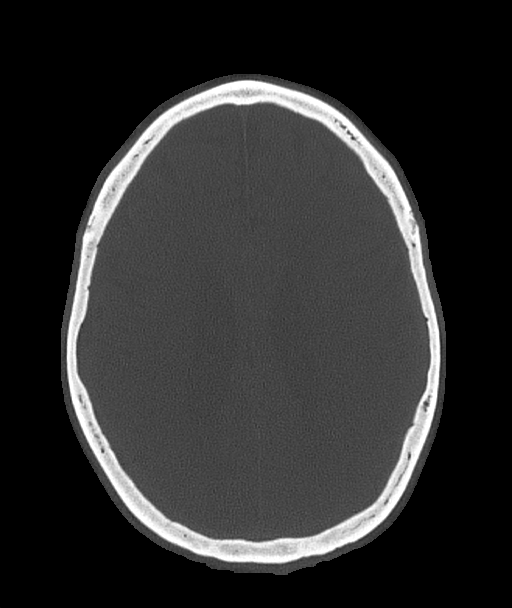
[im 68/91  brain]
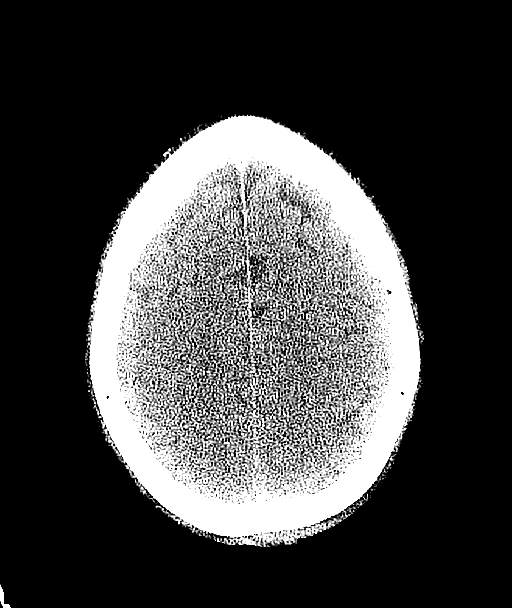
[im 79/91  brain]
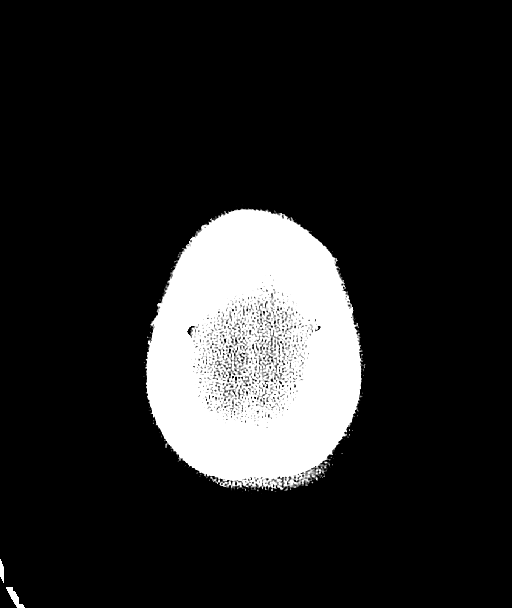

[Series 10: head 3.0 mpr sag · sagittal · 0.35mm/px · 2 of 57 slices shown]
[im 19/57  brain]
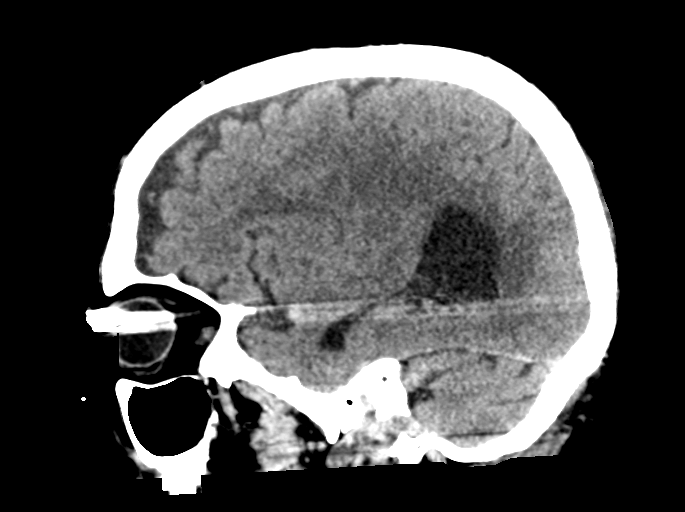
[im 38/57  brain]
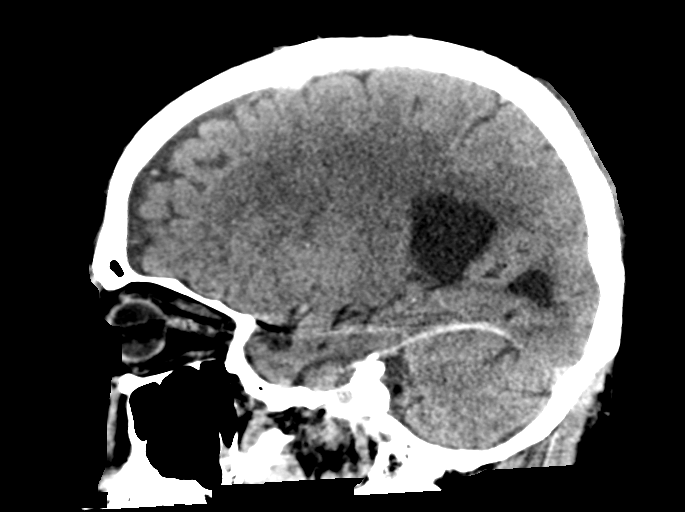

[12 of 47 positions shown; findings below may reference images not displayed]

FINDINGS: CT HEAD FINDINGS

Brain: A new small hyperdense subdural hematoma over the left
lateral cerebral convexity measures up to 3 mm in thickness without
mass effect. Trace subdural hemorrhage is also present along the
posterior aspect of the falx and along the left greater than right
tentorial leaflets, and there is also new trace hypoattenuating
subdural fluid or blood over the right cerebral convexity. There is
minimal acute hemorrhage in the fourth ventricle. There is also a
small extra-axial focus of curvilinear hyperdensity along the
posterolateral aspect of third ventricle coursing towards the pineal
region (series 4, image 16). No acute cortically based infarct or
midline shift is evident. There is mild cerebral atrophy. Patchy
hypodensities in the cerebral white matter bilaterally are unchanged
and nonspecific but compatible with moderate chronic small vessel
ischemic disease.

Vascular: Calcified atherosclerosis at the skull base.

Skull: No acute fracture or suspicious osseous lesion.

Sinuses/Orbits: Clear paranasal sinuses. Unchanged large right
mastoid effusion. Right eyelid weight. Bilateral cataract
extraction.

Other: Postoperative changes involving the right parotid gland.

CT CERVICAL SPINE FINDINGS

Alignment: Chronic trace anterolisthesis of C7 on T1.

Skull base and vertebrae: No acute fracture or suspicious osseous
lesion.

Soft tissues and spinal canal: No prevertebral fluid or swelling. No
visible canal hematoma.

Disc levels: Multilevel disc degeneration, moderate at C6-7.
Asymmetrically advanced right-sided cervical facet arthrosis at
multiple levels. Right facet ankylosis at C2-3. Moderate multilevel
neural foraminal stenosis and at least mild multilevel spinal
stenosis.

Upper chest: Clear lung apices.

Other: Postoperative changes in the right upper neck.
IMPRESSION: 1. Small acute bilateral subdural hematomas without mass effect.
2. Small volume acute hemorrhage in the fourth ventricle.
3. Small hyperdensity along the posterolateral aspect of the third
ventricle on the right, likely additional extra-axial hemorrhage
although deep cerebral vein thrombosis is an alternative
consideration.
4. Moderate chronic small vessel ischemic disease.
5. No acute cervical spine fracture.

Critical Value/emergent results were called by telephone at the time
of interpretation on 11/21/2021 at [DATE] to provider Katchen
Roberto, who verbally acknowledged these results.

ADDENDUM:
Posterior scalp laceration.

*** End of Addendum ***
FINDINGS: CT HEAD FINDINGS

Brain: A new small hyperdense subdural hematoma over the left
lateral cerebral convexity measures up to 3 mm in thickness without
mass effect. Trace subdural hemorrhage is also present along the
posterior aspect of the falx and along the left greater than right
tentorial leaflets, and there is also new trace hypoattenuating
subdural fluid or blood over the right cerebral convexity. There is
minimal acute hemorrhage in the fourth ventricle. There is also a
small extra-axial focus of curvilinear hyperdensity along the
posterolateral aspect of third ventricle coursing towards the pineal
region (series 4, image 16). No acute cortically based infarct or
midline shift is evident. There is mild cerebral atrophy. Patchy
hypodensities in the cerebral white matter bilaterally are unchanged
and nonspecific but compatible with moderate chronic small vessel
ischemic disease.

Vascular: Calcified atherosclerosis at the skull base.

Skull: No acute fracture or suspicious osseous lesion.

Sinuses/Orbits: Clear paranasal sinuses. Unchanged large right
mastoid effusion. Right eyelid weight. Bilateral cataract
extraction.

Other: Postoperative changes involving the right parotid gland.

CT CERVICAL SPINE FINDINGS

Alignment: Chronic trace anterolisthesis of C7 on T1.

Skull base and vertebrae: No acute fracture or suspicious osseous
lesion.

Soft tissues and spinal canal: No prevertebral fluid or swelling. No
visible canal hematoma.

Disc levels: Multilevel disc degeneration, moderate at C6-7.
Asymmetrically advanced right-sided cervical facet arthrosis at
multiple levels. Right facet ankylosis at C2-3. Moderate multilevel
neural foraminal stenosis and at least mild multilevel spinal
stenosis.

Upper chest: Clear lung apices.

Other: Postoperative changes in the right upper neck.
IMPRESSION: 1. Small acute bilateral subdural hematomas without mass effect.
2. Small volume acute hemorrhage in the fourth ventricle.
3. Small hyperdensity along the posterolateral aspect of the third
ventricle on the right, likely additional extra-axial hemorrhage
although deep cerebral vein thrombosis is an alternative
consideration.
4. Moderate chronic small vessel ischemic disease.
5. No acute cervical spine fracture.

Critical Value/emergent results were called by telephone at the time
of interpretation on 11/21/2021 at [DATE] to provider Katchen
Roberto, who verbally acknowledged these results.
